# Patient Record
Sex: Female | Born: 1985 | Race: Black or African American | Hispanic: No | Marital: Married | Smoking: Never smoker
Health system: Southern US, Community
[De-identification: ages and names within clinical notes are randomized; demographics above are authoritative.]

## PROBLEM LIST (undated history)

## (undated) ENCOUNTER — Inpatient Hospital Stay (HOSPITAL_COMMUNITY): Payer: Self-pay

## (undated) DIAGNOSIS — D696 Thrombocytopenia, unspecified: Secondary | ICD-10-CM

## (undated) DIAGNOSIS — K219 Gastro-esophageal reflux disease without esophagitis: Secondary | ICD-10-CM

## (undated) DIAGNOSIS — L709 Acne, unspecified: Secondary | ICD-10-CM

## (undated) DIAGNOSIS — Z9889 Other specified postprocedural states: Secondary | ICD-10-CM

## (undated) DIAGNOSIS — IMO0002 Reserved for concepts with insufficient information to code with codable children: Secondary | ICD-10-CM

## (undated) DIAGNOSIS — S82409A Unspecified fracture of shaft of unspecified fibula, initial encounter for closed fracture: Secondary | ICD-10-CM

## (undated) DIAGNOSIS — R87619 Unspecified abnormal cytological findings in specimens from cervix uteri: Secondary | ICD-10-CM

## (undated) DIAGNOSIS — S83519A Sprain of anterior cruciate ligament of unspecified knee, initial encounter: Secondary | ICD-10-CM

## (undated) DIAGNOSIS — N390 Urinary tract infection, site not specified: Secondary | ICD-10-CM

## (undated) DIAGNOSIS — L509 Urticaria, unspecified: Secondary | ICD-10-CM

## (undated) DIAGNOSIS — A6009 Herpesviral infection of other urogenital tract: Secondary | ICD-10-CM

## (undated) DIAGNOSIS — Z22322 Carrier or suspected carrier of Methicillin resistant Staphylococcus aureus: Secondary | ICD-10-CM

## (undated) DIAGNOSIS — Z889 Allergy status to unspecified drugs, medicaments and biological substances status: Secondary | ICD-10-CM

## (undated) DIAGNOSIS — A749 Chlamydial infection, unspecified: Secondary | ICD-10-CM

## (undated) DIAGNOSIS — N83209 Unspecified ovarian cyst, unspecified side: Secondary | ICD-10-CM

## (undated) HISTORY — PX: OTHER SURGICAL HISTORY: SHX169

## (undated) HISTORY — DX: Urticaria, unspecified: L50.9

---
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
## 2005-01-06 ENCOUNTER — Emergency Department (HOSPITAL_COMMUNITY): Admission: EM | Admit: 2005-01-06 | Discharge: 2005-01-06 | Payer: Self-pay | Admitting: Internal Medicine

## 2005-03-15 LAB — OB RESULTS CONSOLE HEPATITIS B SURFACE ANTIGEN: Hepatitis B Surface Ag: NEGATIVE

## 2005-07-21 DIAGNOSIS — S82409A Unspecified fracture of shaft of unspecified fibula, initial encounter for closed fracture: Secondary | ICD-10-CM

## 2005-07-21 HISTORY — DX: Unspecified fracture of shaft of unspecified fibula, initial encounter for closed fracture: S82.409A

## 2005-09-10 ENCOUNTER — Ambulatory Visit (HOSPITAL_COMMUNITY): Admission: RE | Admit: 2005-09-10 | Discharge: 2005-09-10 | Payer: Self-pay | Admitting: Obstetrics & Gynecology

## 2005-09-10 IMAGING — US US OB COMP LESS 14 WK
1 series · 18 of 28 positions shown · non-contrast
Comparison: none

CLINICAL DATA: Uncertain menstrual dates.  
 OBSTETRICAL ULTRASOUND <14 WKS AND TRANSVAGINAL OB US:
TECHNIQUE: Both transabdominal and transvaginal ultrasound examinations were performed for complete evaluation of the gestation as well as the maternal uterus, adnexal regions, and pelvic cul-de-sac.

[Series 1: us ob comp less 14 wks · 18 of 51 slices shown]
[im 1/51]
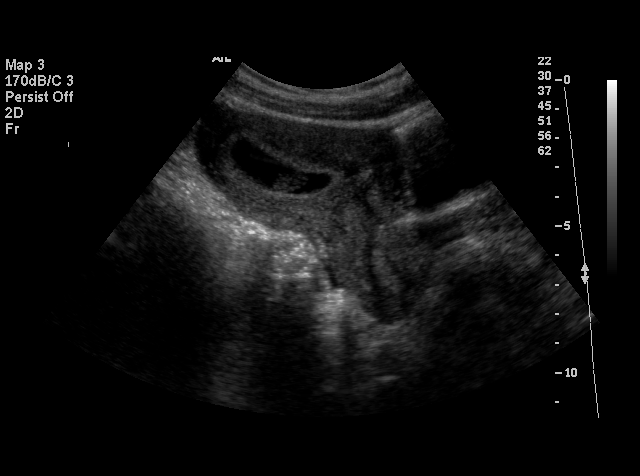
[im 4/51]
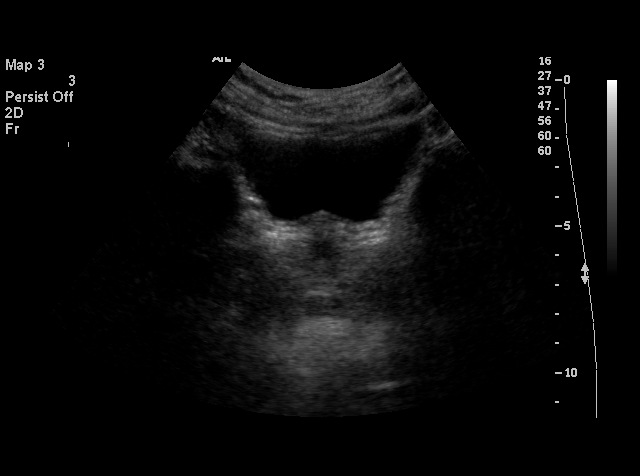
[im 6/51]
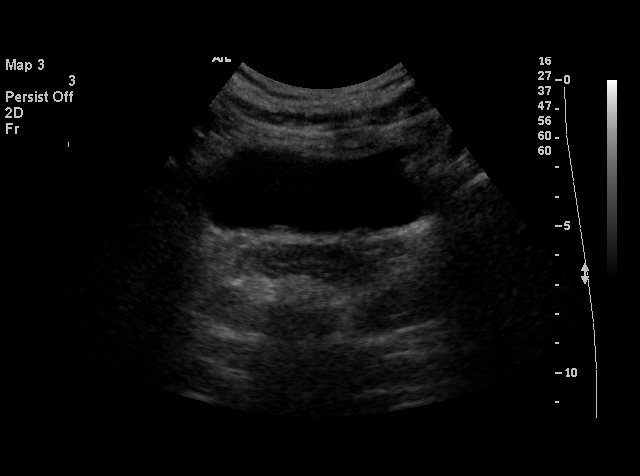
[im 10/51]
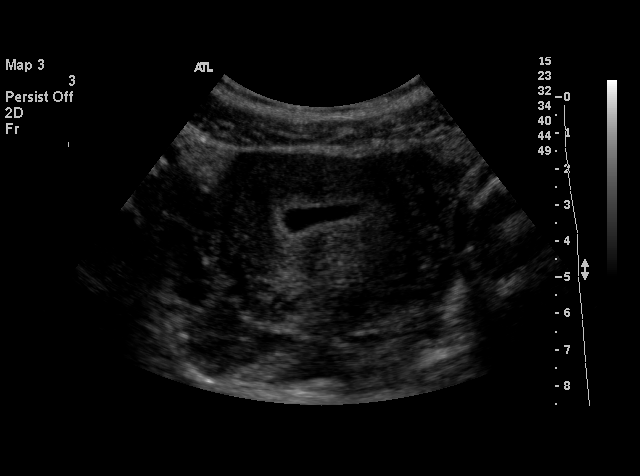
[im 13/51]
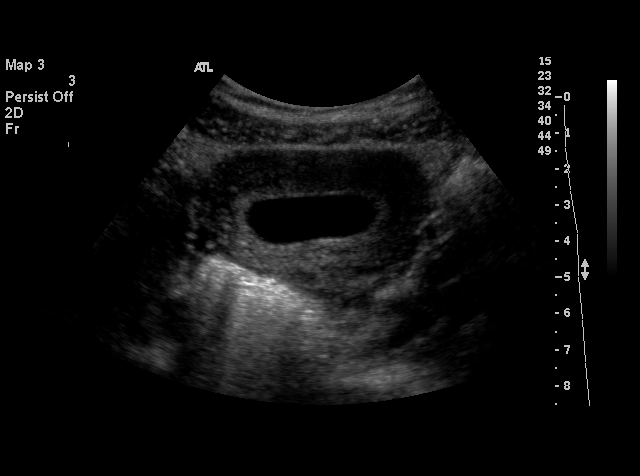
[im 15/51]
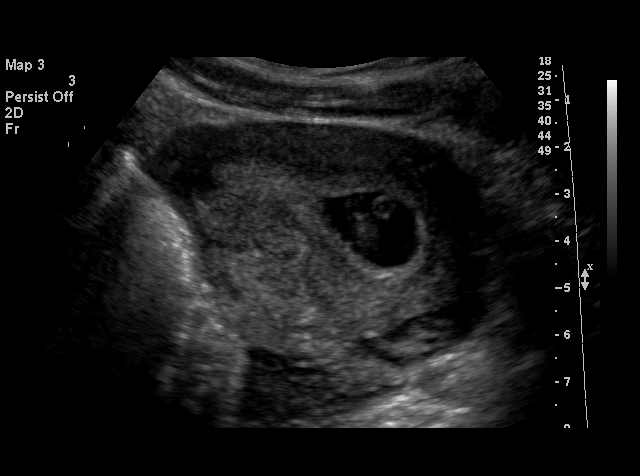
[im 19/51]
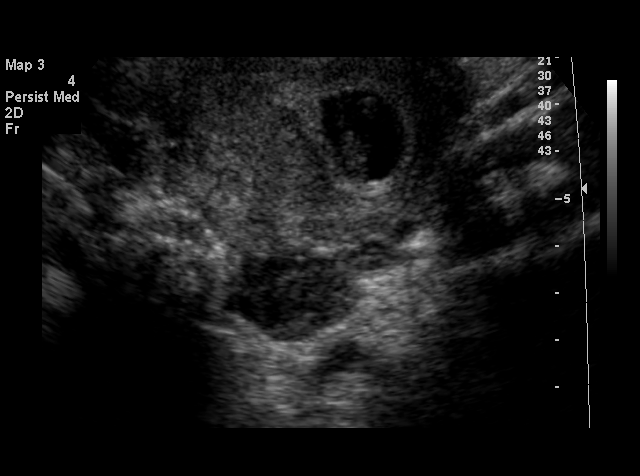
[im 21/51]
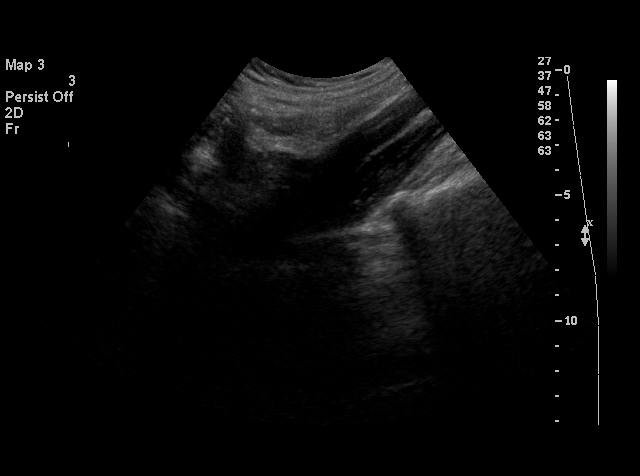
[im 25/51]
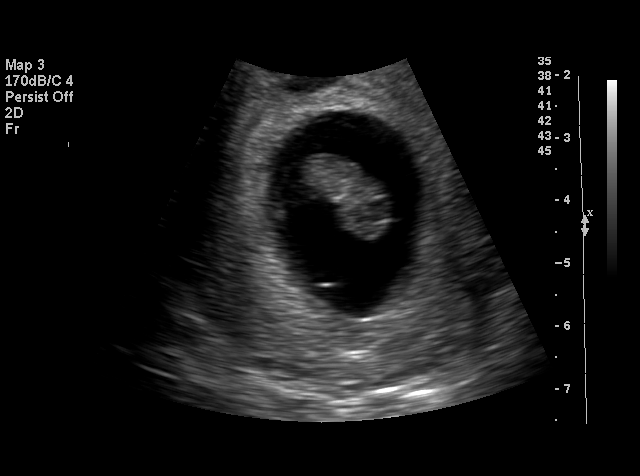
[im 26/51]
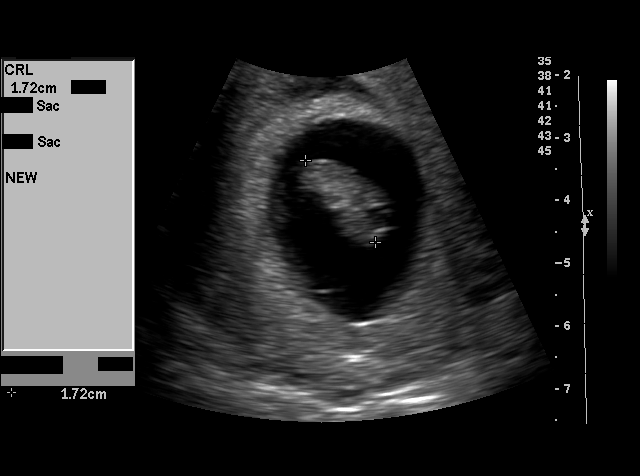
[im 30/51]
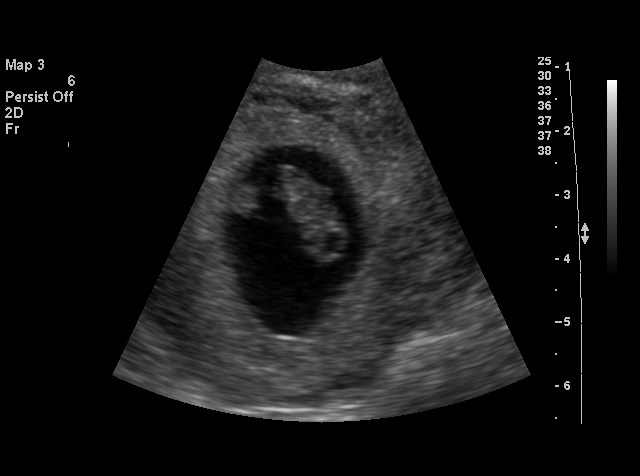
[im 32/51]
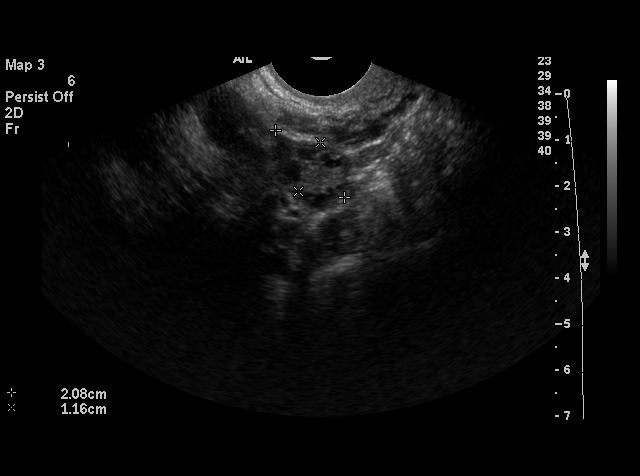
[im 36/51]
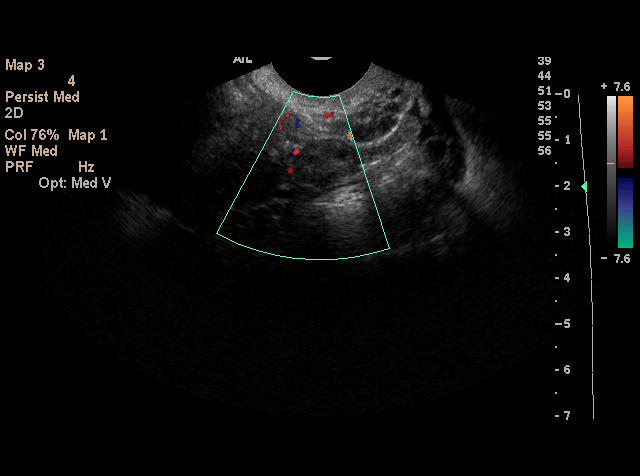
[im 39/51]
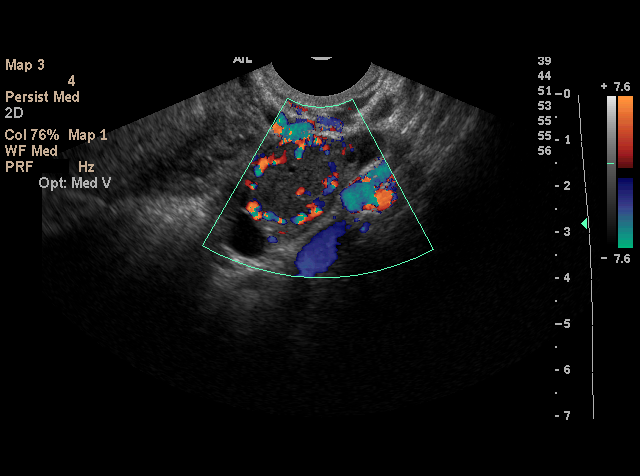
[im 41/51]
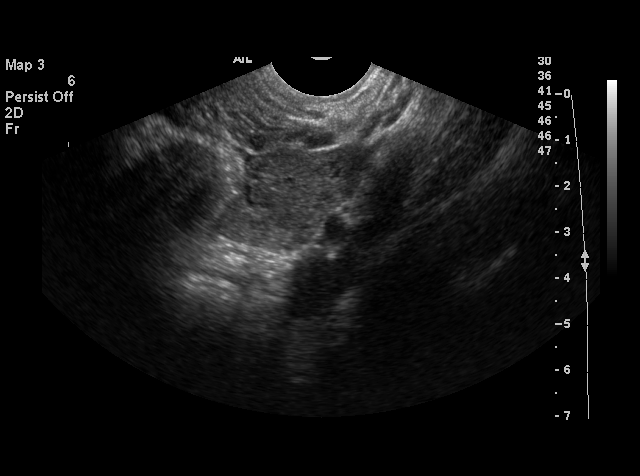
[im 45/51]
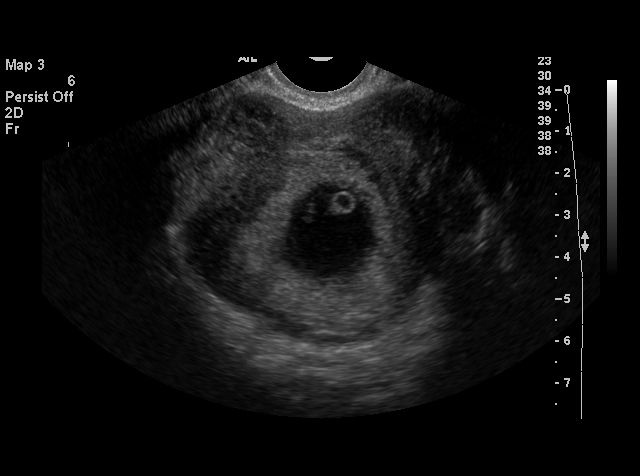
[im 47/51]
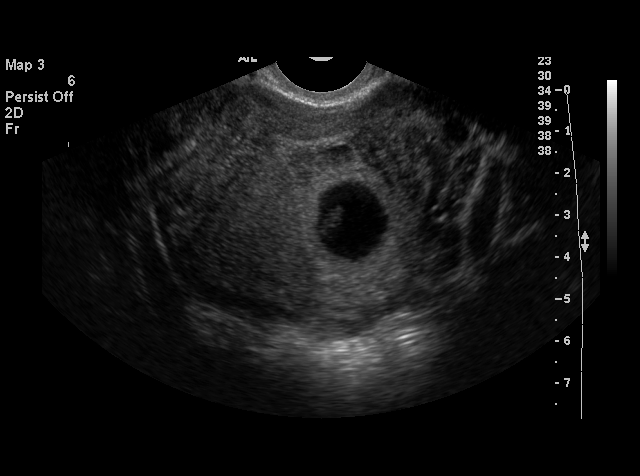
[im 51/51]
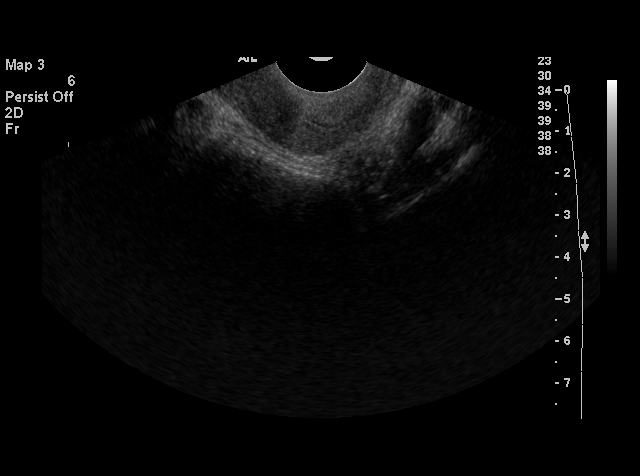

[18 of 28 positions shown; findings below may reference images not displayed]

FINDINGS: A single living intrauterine gestation is seen with measured heart rate of 171.  Embryonic crown rump length measures 1.7 cm, corresponding with a gestational age of 8 weeks 2 days.  A normal appearing yolk sac is seen.  A tiny subchorionic hemorrhage or implantation bleed is noted.  No fibroids or other uterine abnormalities are identified.
 Both ovaries are visualized and are normal in appearance.  A small corpus luteum is noted in the left ovary measuring approximately 1.8 cm.  There is no evidence of adnexal mass or free fluid.
IMPRESSION: 1.  Single living intrauterine gestation with estimated gestational age of 8 weeks 2 days and sonographic EDC of [DATE].
 2.  Tiny subchorionic hemorrhage/implantation bleed noted.  No evidence of maternal uterine or adnexal mass.

## 2005-11-18 ENCOUNTER — Emergency Department (HOSPITAL_COMMUNITY): Admission: EM | Admit: 2005-11-18 | Discharge: 2005-11-19 | Payer: Self-pay | Admitting: Emergency Medicine

## 2005-12-02 IMAGING — CR DG CHEST 1V PORT
1 series · 1 of 1 positions shown · non-contrast
Comparison: None

CLINICAL DATA: Dizziness

PORTABLE CHEST - 1 VIEW:

[view not recorded]
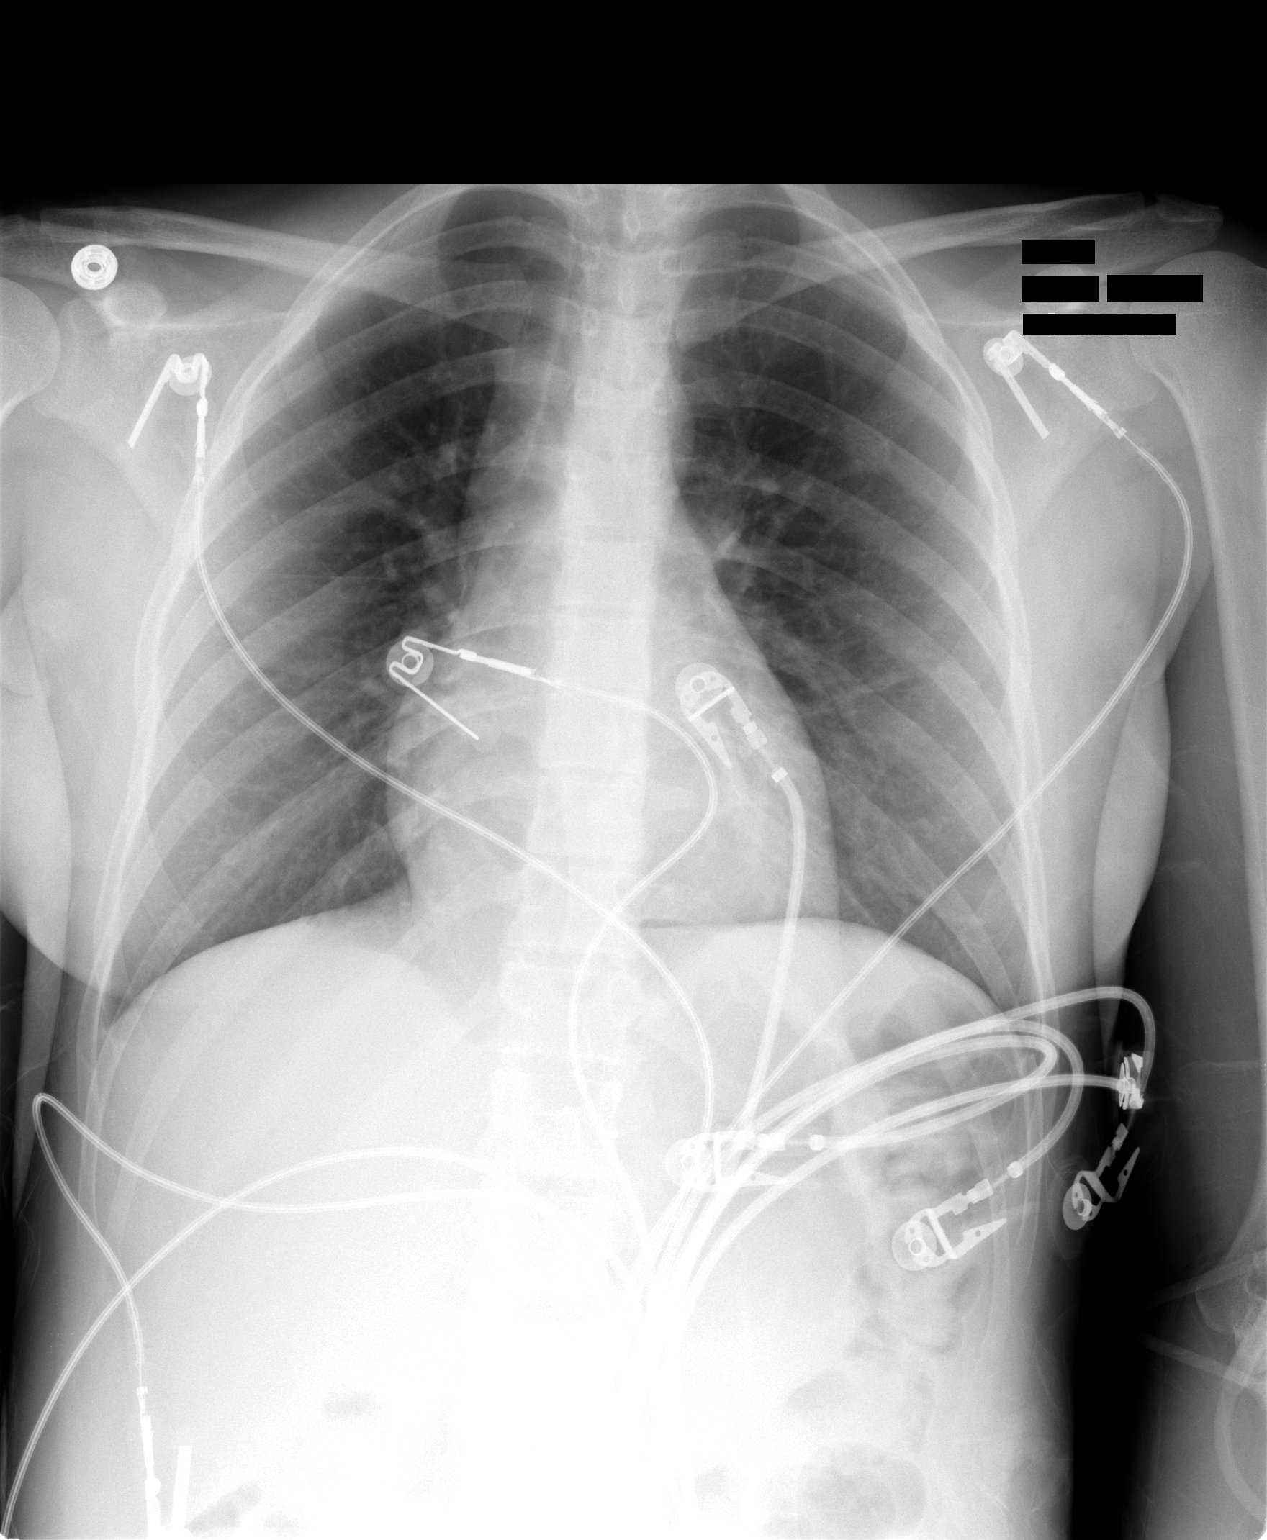

[1 of 1 positions shown; findings below may reference images not displayed]

FINDINGS: The heart size and mediastinal contours are within normal limits. 
Both lungs are clear.
IMPRESSION: No acute disease.

## 2006-02-11 ENCOUNTER — Emergency Department (HOSPITAL_COMMUNITY): Admission: EM | Admit: 2006-02-11 | Discharge: 2006-02-11 | Payer: Self-pay | Admitting: Emergency Medicine

## 2007-06-03 ENCOUNTER — Inpatient Hospital Stay (HOSPITAL_COMMUNITY): Admission: AD | Admit: 2007-06-03 | Discharge: 2007-06-03 | Payer: Self-pay | Admitting: Obstetrics & Gynecology

## 2007-06-03 IMAGING — US US OB COMP LESS 14 WK
1 series · 13 of 13 positions shown · non-contrast
Comparison: none

OBSTETRICAL ULTRASOUND:

 This ultrasound exam was performed in the [HOSPITAL] Ultrasound Department.  The OB US report was generated in the AS system, and faxed to the ordering physician.  This report is also available in [REDACTED] PACS.

[Series 1: us ob comp less 14 wk · 0.23mm/px · 13 of 13 slices shown]
[im 1/13]
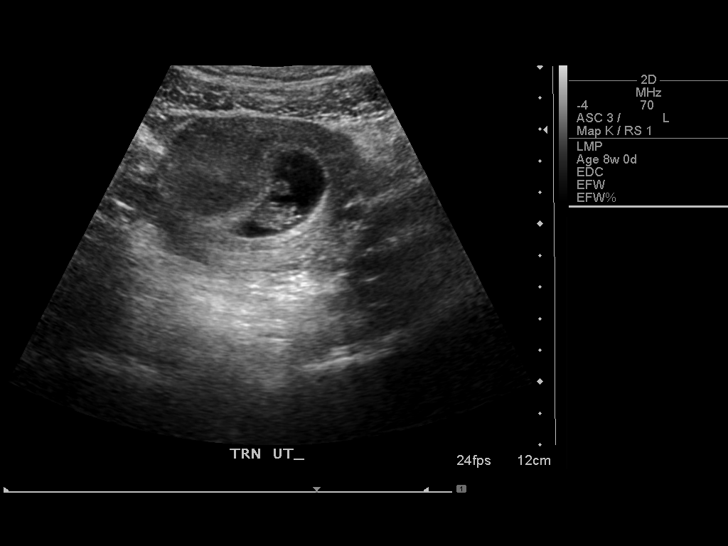
[im 2/13]
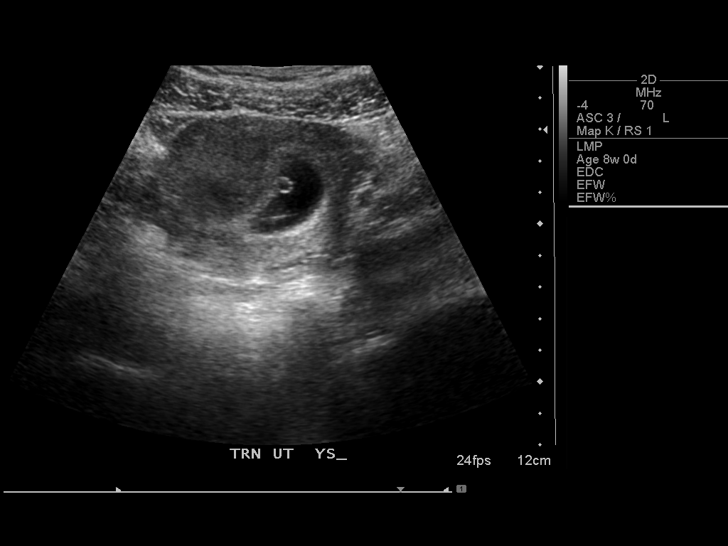
[im 3/13]
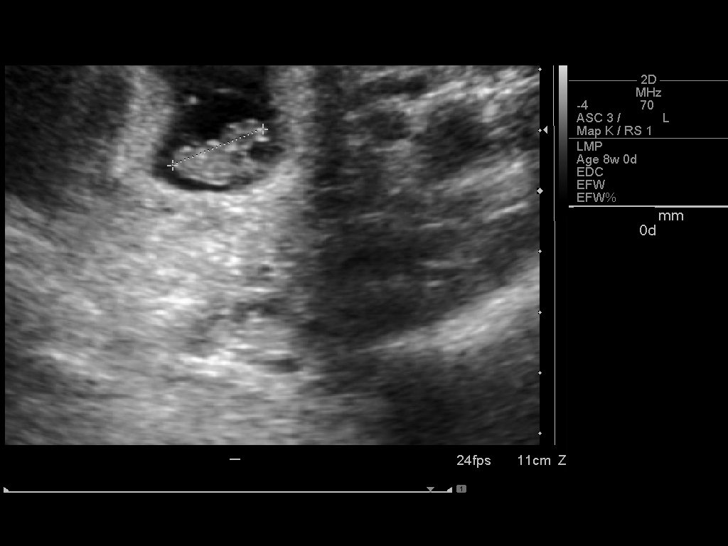
[im 4/13]
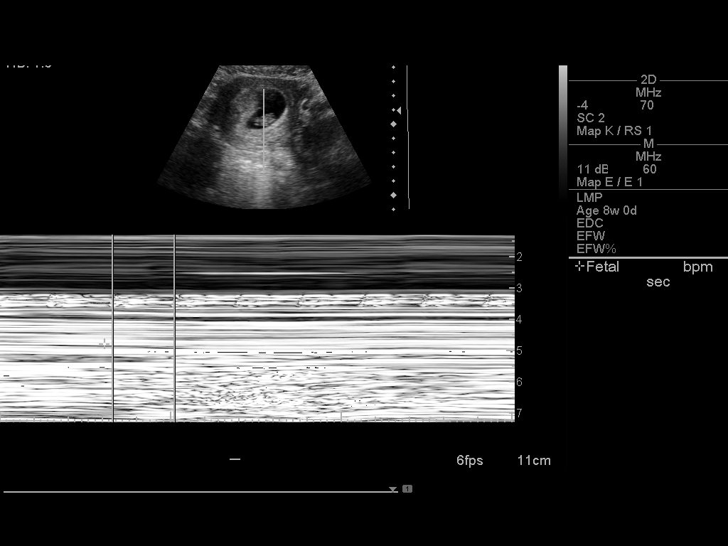
[im 5/13]
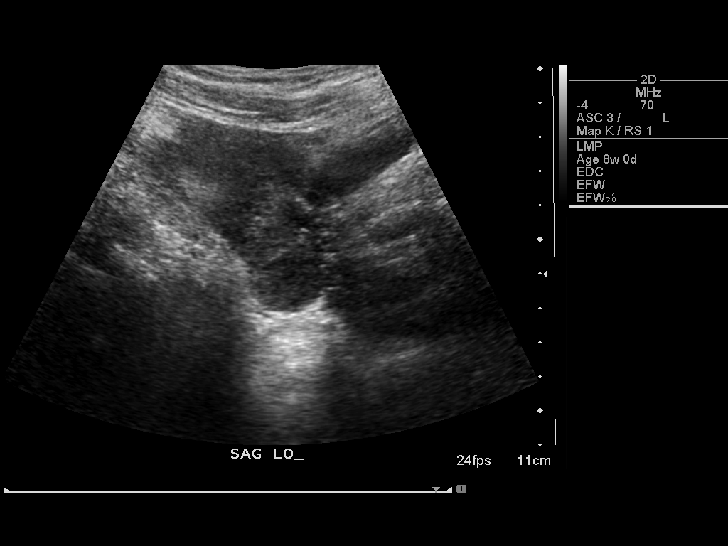
[im 6/13]
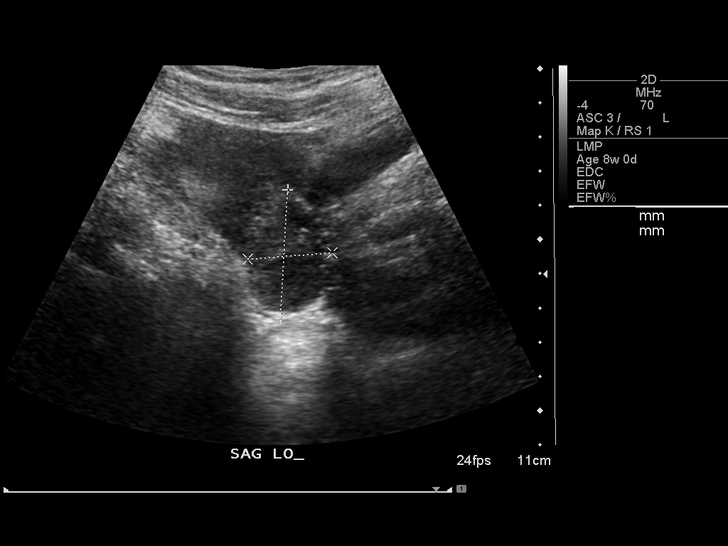
[im 7/13]
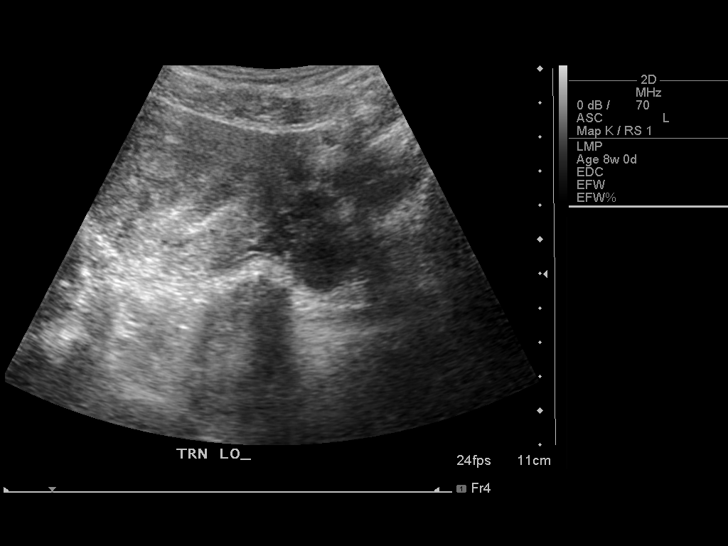
[im 8/13]
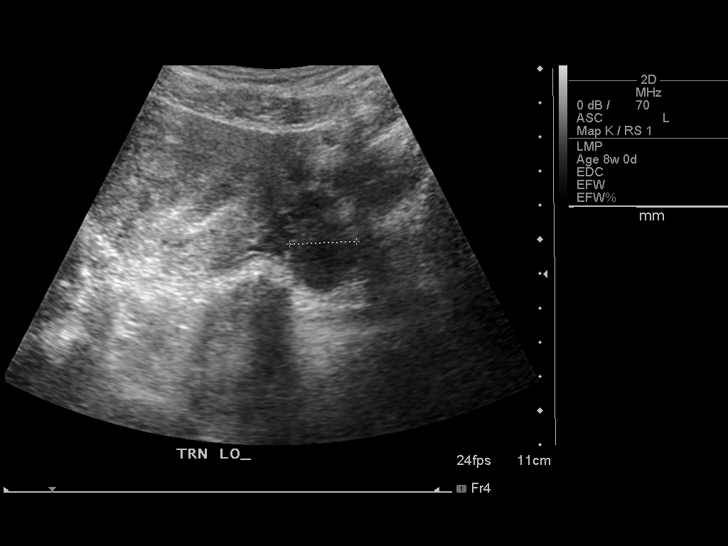
[im 9/13]
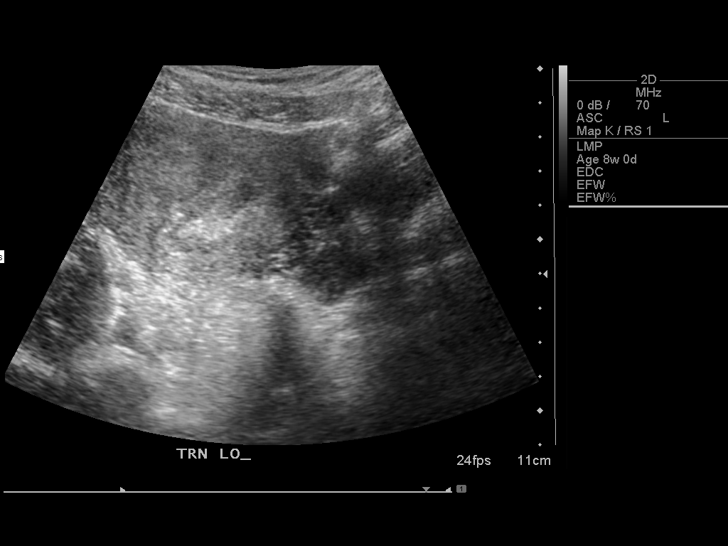
[im 10/13]
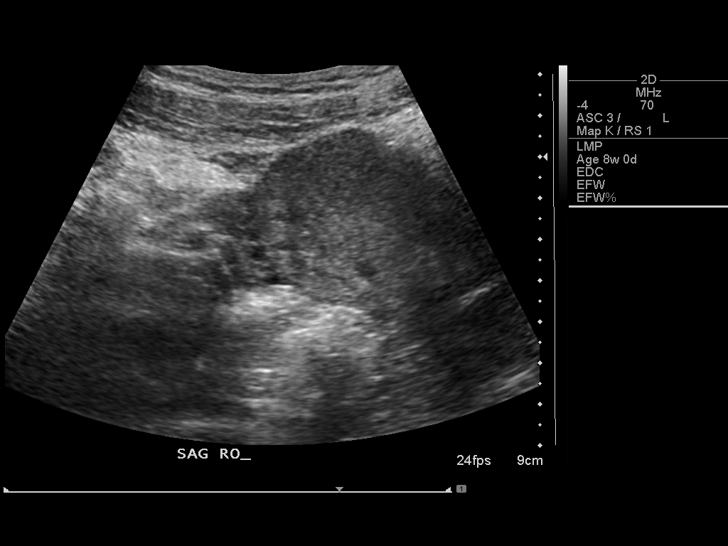
[im 11/13]
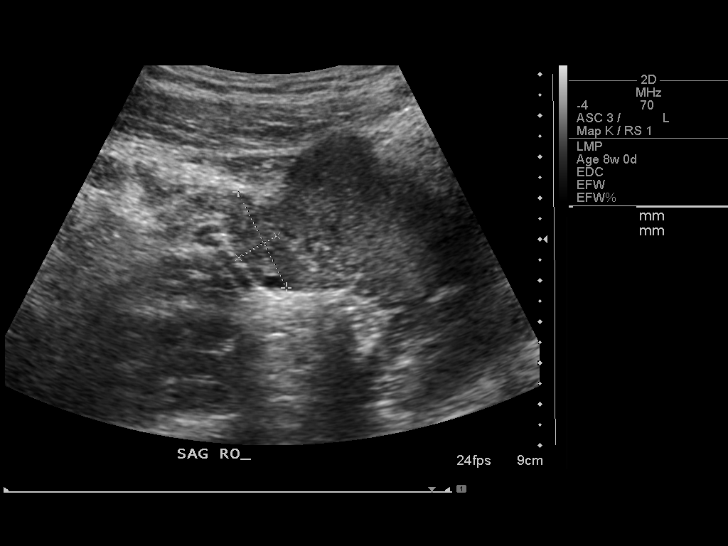
[im 12/13]
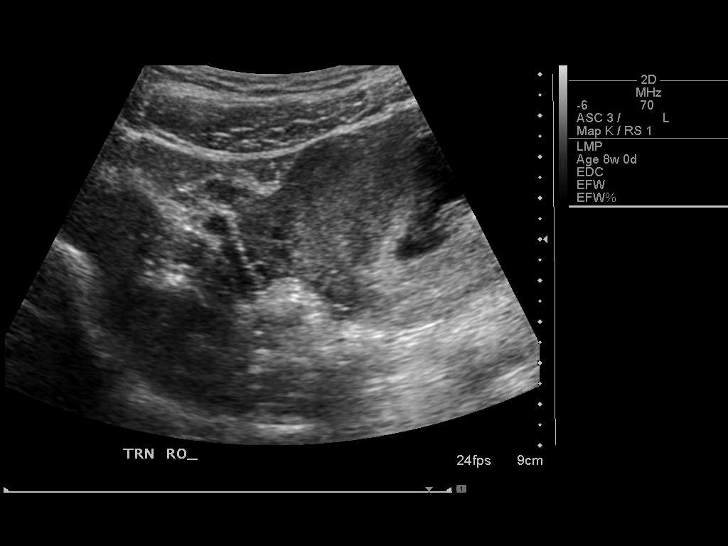
[im 13/13]
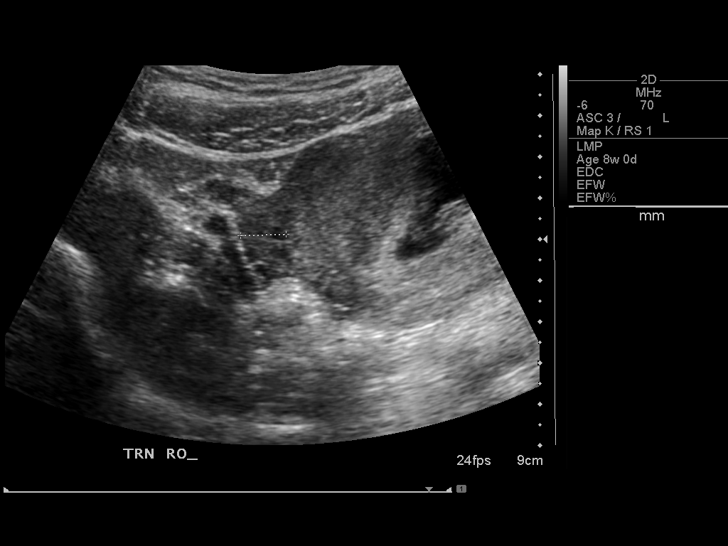

[13 of 13 positions shown; findings below may reference images not displayed]

IMPRESSION: See AS Obstetric US report.

## 2007-07-24 ENCOUNTER — Inpatient Hospital Stay (HOSPITAL_COMMUNITY): Admission: AD | Admit: 2007-07-24 | Discharge: 2007-07-24 | Payer: Self-pay | Admitting: Obstetrics & Gynecology

## 2007-09-20 ENCOUNTER — Inpatient Hospital Stay (HOSPITAL_COMMUNITY): Admission: AD | Admit: 2007-09-20 | Discharge: 2007-09-20 | Payer: Self-pay | Admitting: *Deleted

## 2007-10-29 ENCOUNTER — Inpatient Hospital Stay (HOSPITAL_COMMUNITY): Admission: AD | Admit: 2007-10-29 | Discharge: 2007-10-30 | Payer: Self-pay | Admitting: Obstetrics

## 2007-11-05 ENCOUNTER — Ambulatory Visit (HOSPITAL_COMMUNITY): Admission: RE | Admit: 2007-11-05 | Discharge: 2007-11-05 | Payer: Self-pay | Admitting: *Deleted

## 2007-11-05 IMAGING — US US ABDOMEN COMPLETE
1 series · 18 of 25 positions shown · non-contrast
Comparison: None

CLINICAL DATA: 30 weeks estimated gestational age with epigastric
pain for 2 weeks

ABDOMEN ULTRASOUND
TECHNIQUE: Complete abdominal ultrasound examination was performed
including evaluation of the liver, gallbladder, bile ducts,
pancreas, kidneys, spleen, IVC, and abdominal aorta.

[Series 1: us abdomen complete · 18 of 80 slices shown]
[im 1/80]
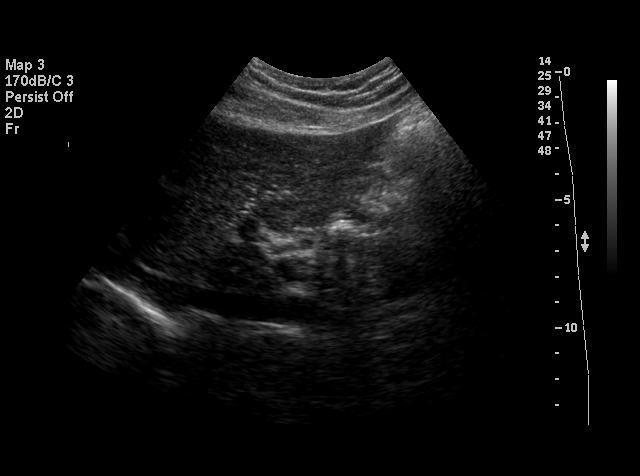
[im 7/80]
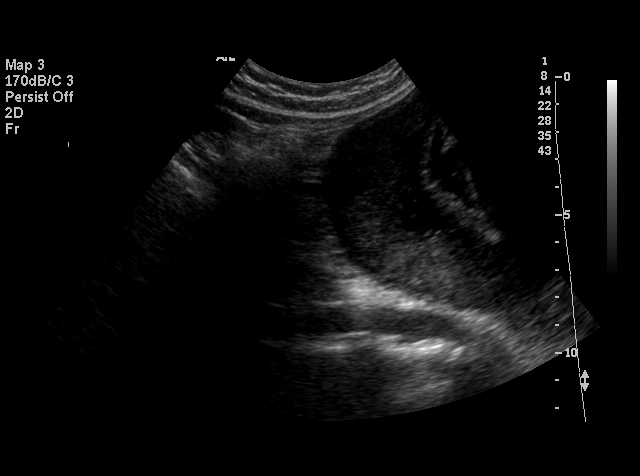
[im 10/80]
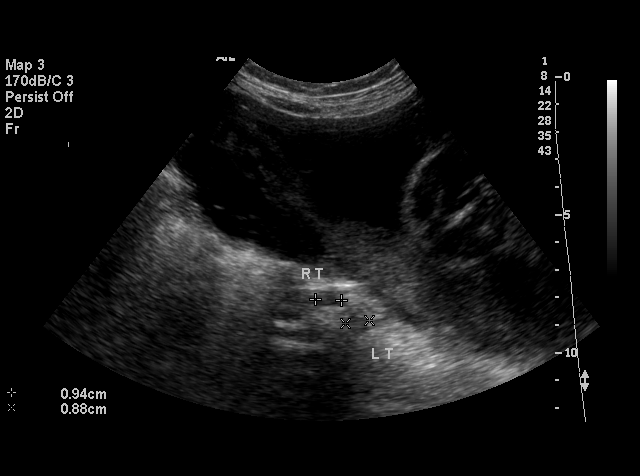
[im 14/80]
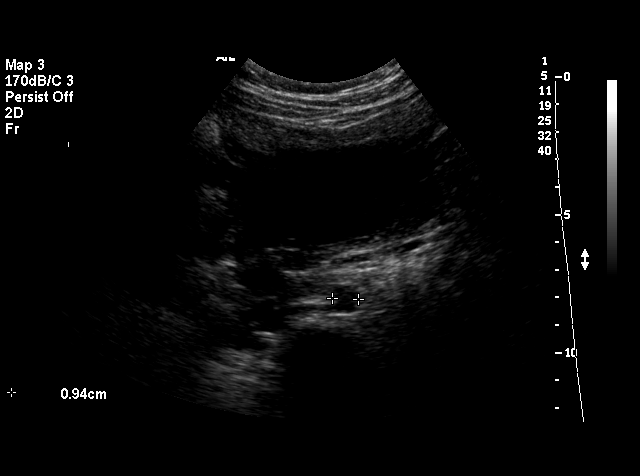
[im 20/80]
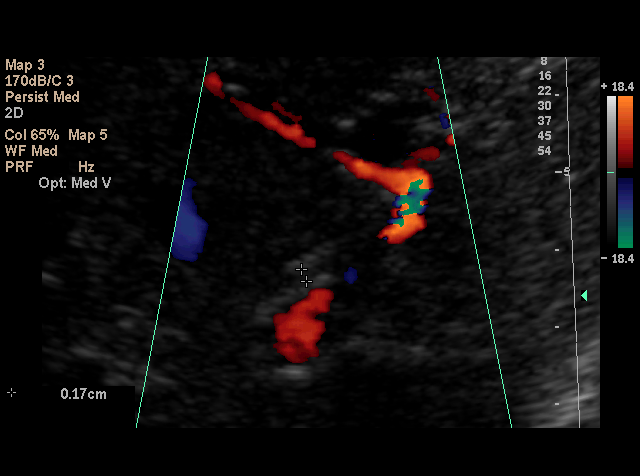
[im 24/80]
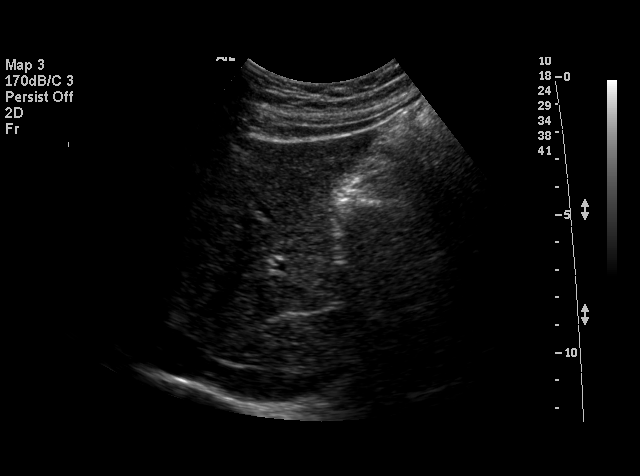
[im 30/80]
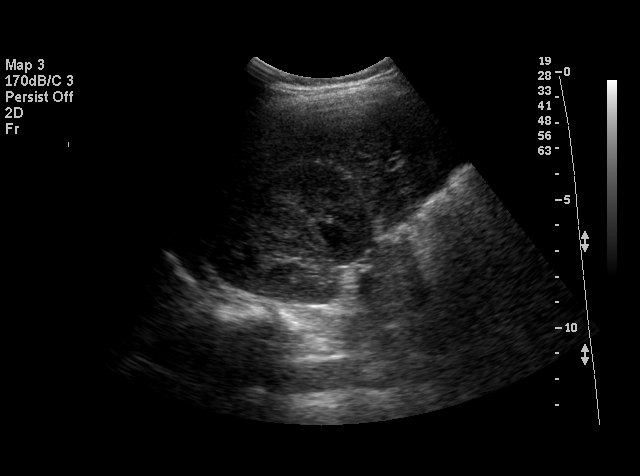
[im 33/80]
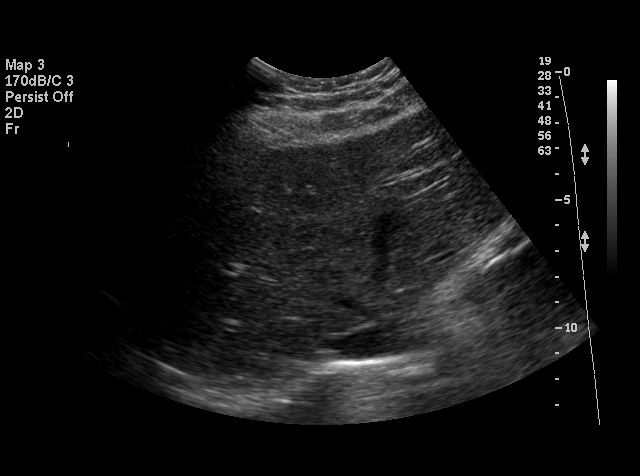
[im 37/80]
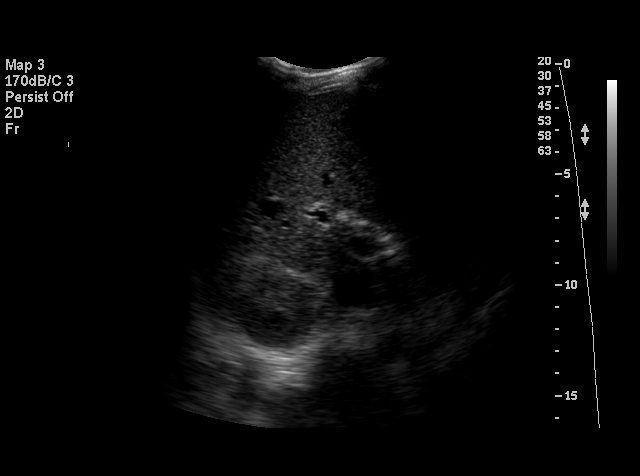
[im 43/80]
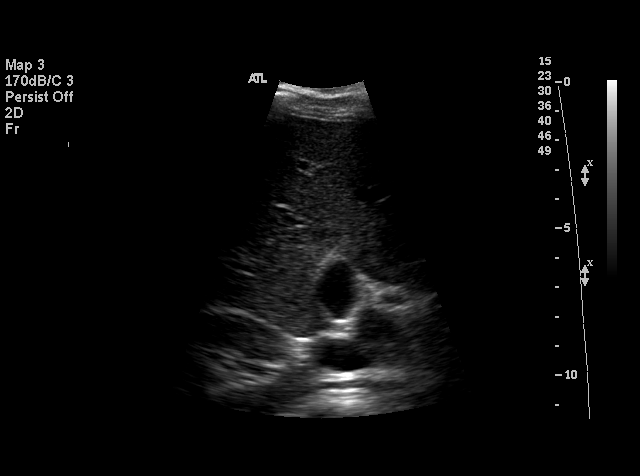
[im 47/80]
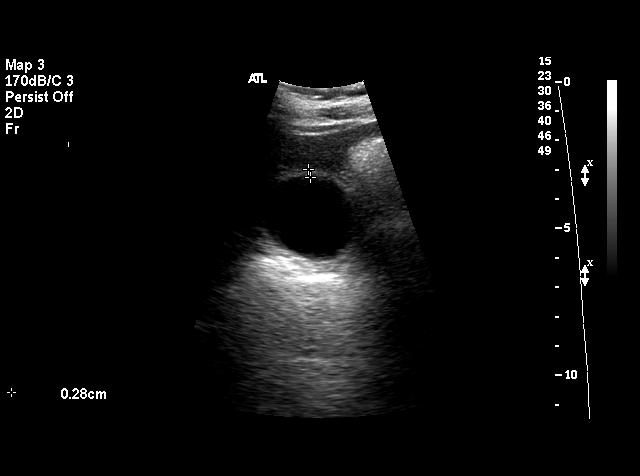
[im 50/80]
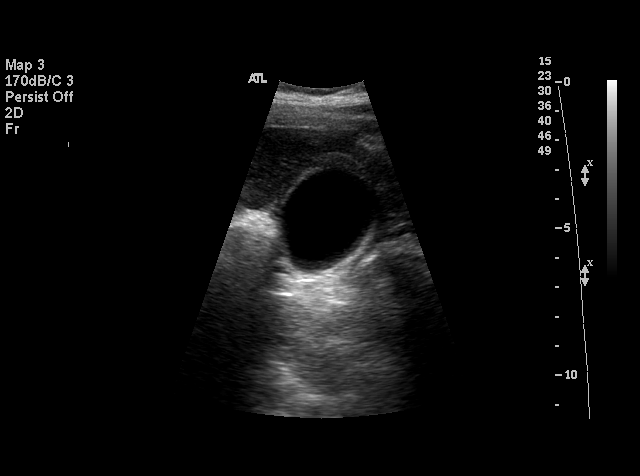
[im 56/80]
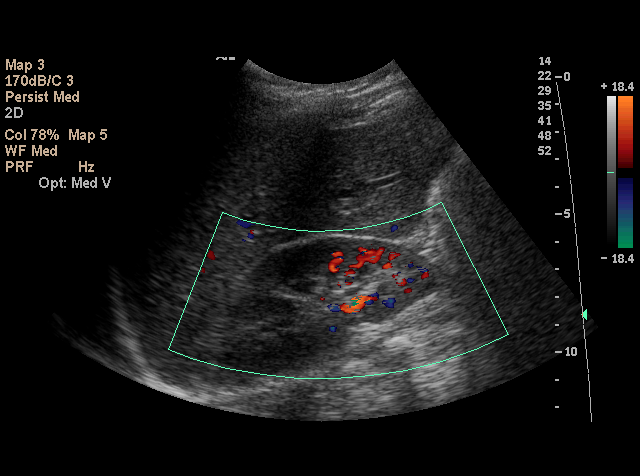
[im 60/80]
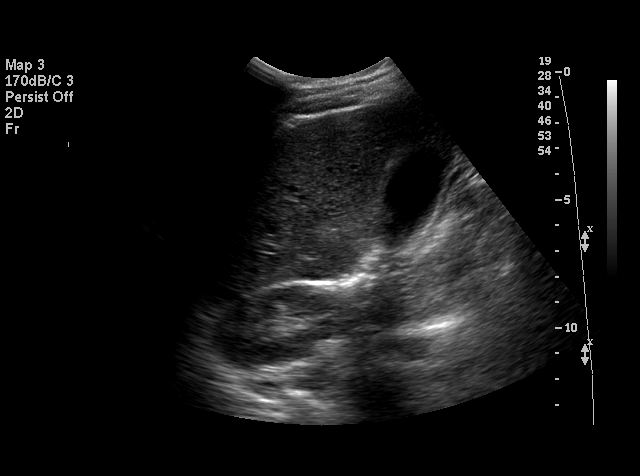
[im 66/80]
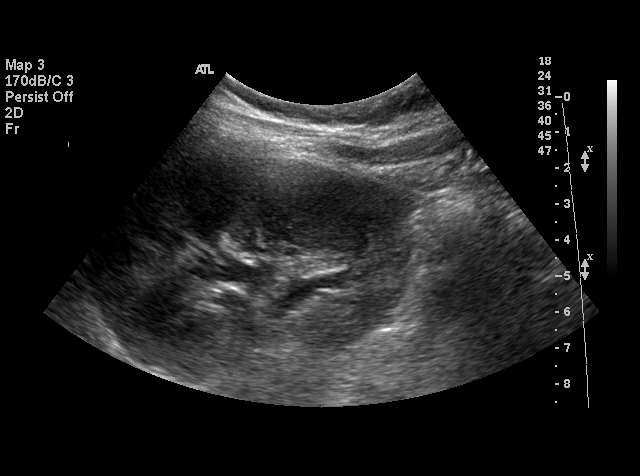
[im 70/80]
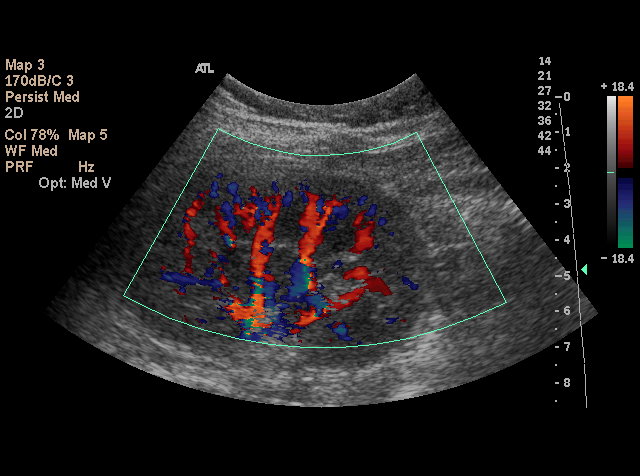
[im 73/80]
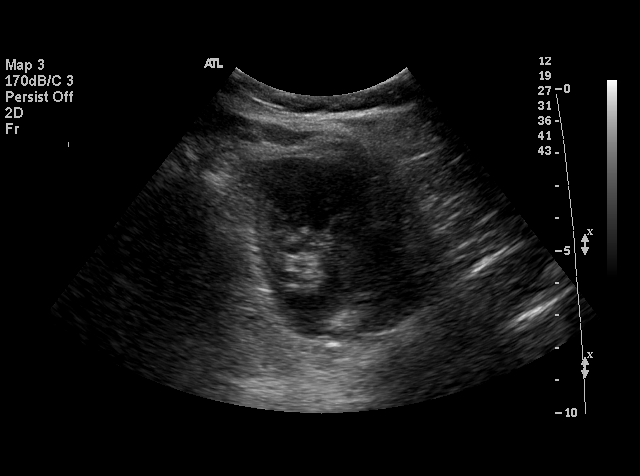
[im 80/80]
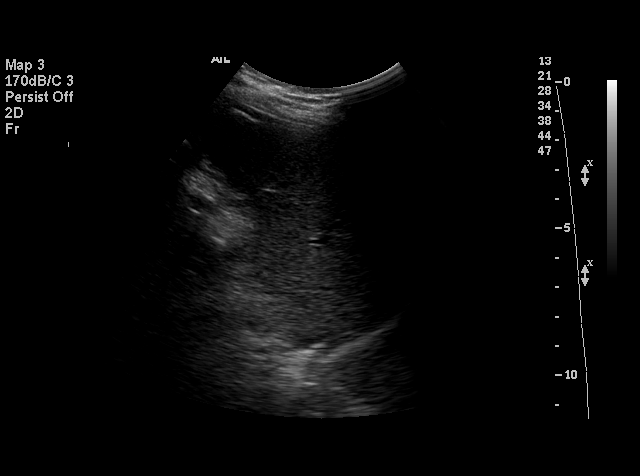

[18 of 25 positions shown; findings below may reference images not displayed]

FINDINGS: The liver parenchyma is homogeneous in echotexture with
no evidence for focal parenchymal abnormality or intrahepatic
ductal dilatation.  The gallbladder is well distended and shows no
evidence for intraluminal stones or sludge.  No pericholecystic
fluid or gallbladder wall thickening is seen. The common bile duct
is normal in size with an AP width of 1.7 mm.

Both kidneys have a normal appearance with the right kidney having
a sagittal length of 8.9 cm and the left kidney having a sagittal
length of 10.0 cm.  No focal parenchymal abnormalities are seen.
The spleen is normal in size and echotexture.

The pancreas is poorly visualized due to overlying gas.  The
abdominal aorta has a maximal caliber of 1.2 cm and the proximal
portion of the inferior vena cava is unremarkable.
IMPRESSION: Poorly visualized pancreas.  Otherwise normal abdominal ultrasound.

## 2007-11-26 ENCOUNTER — Inpatient Hospital Stay (HOSPITAL_COMMUNITY): Admission: AD | Admit: 2007-11-26 | Discharge: 2007-11-26 | Payer: Self-pay | Admitting: Obstetrics and Gynecology

## 2007-11-26 IMAGING — US US OB LIMITED
1 series · 7 of 7 positions shown · non-contrast
Comparison: none

OBSTETRICAL ULTRASOUND:
 This ultrasound exam was performed in the [HOSPITAL] Ultrasound Department.  The OB US report was generated in the AS system, and faxed to the ordering physician.  This report is also available in [REDACTED] PACS.

[Series 1: us ob limited · 0.13mm/px · 7 of 7 slices shown]
[im 1/7]
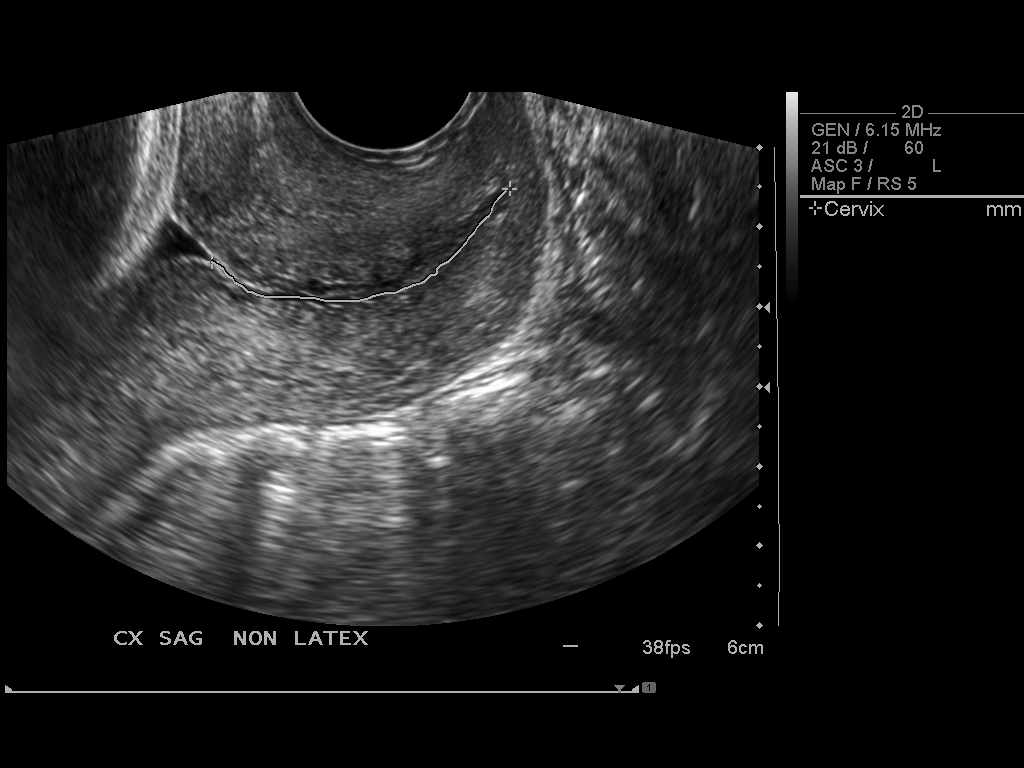
[im 2/7]
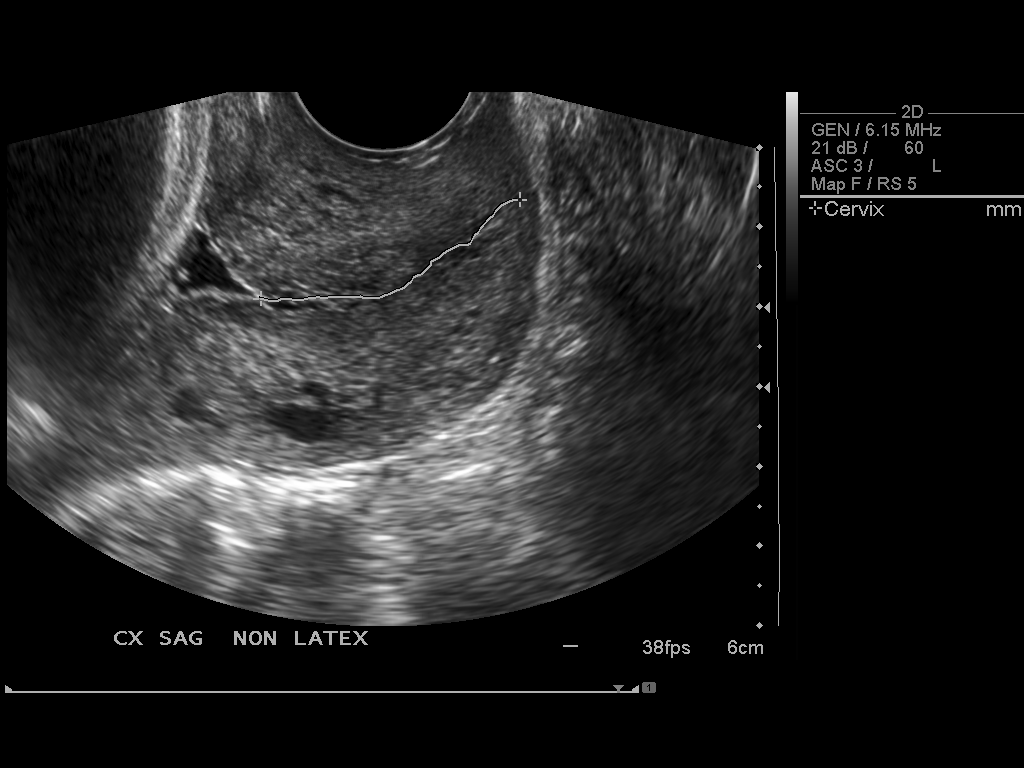
[im 3/7]
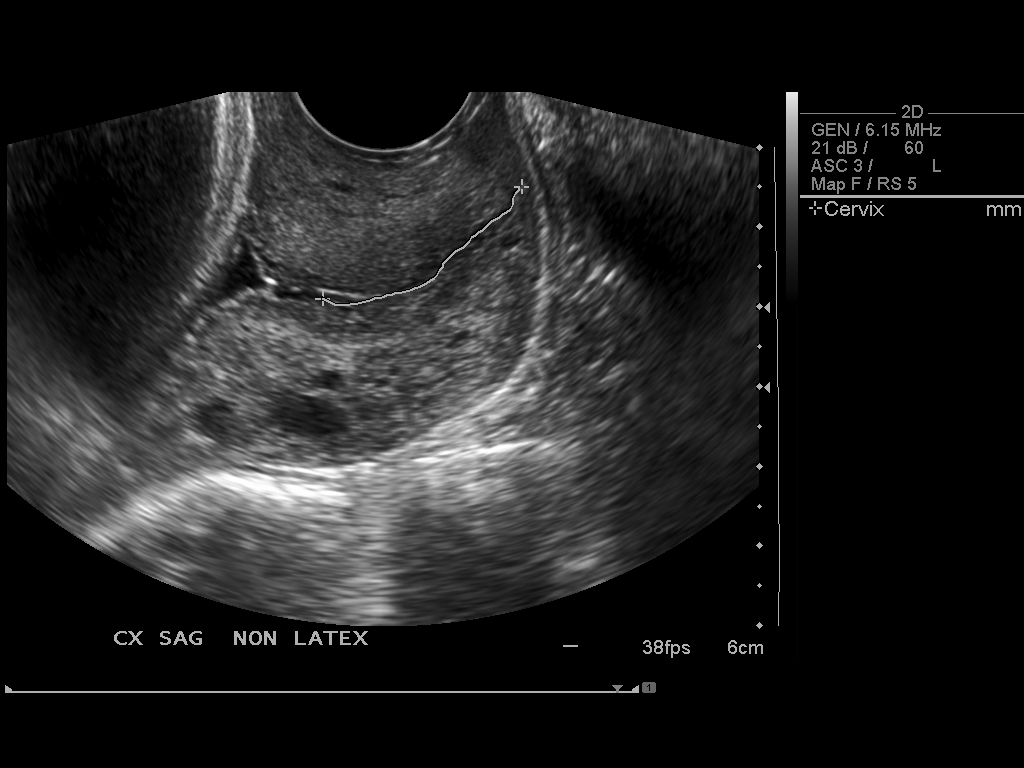
[im 4/7]
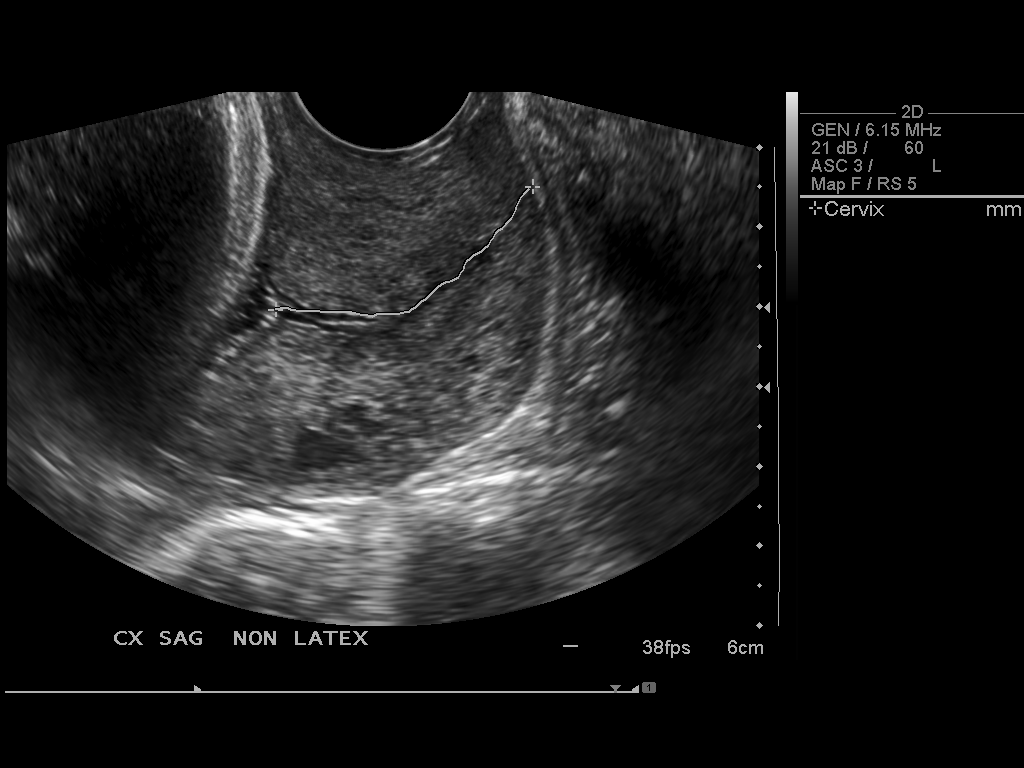
[im 5/7]
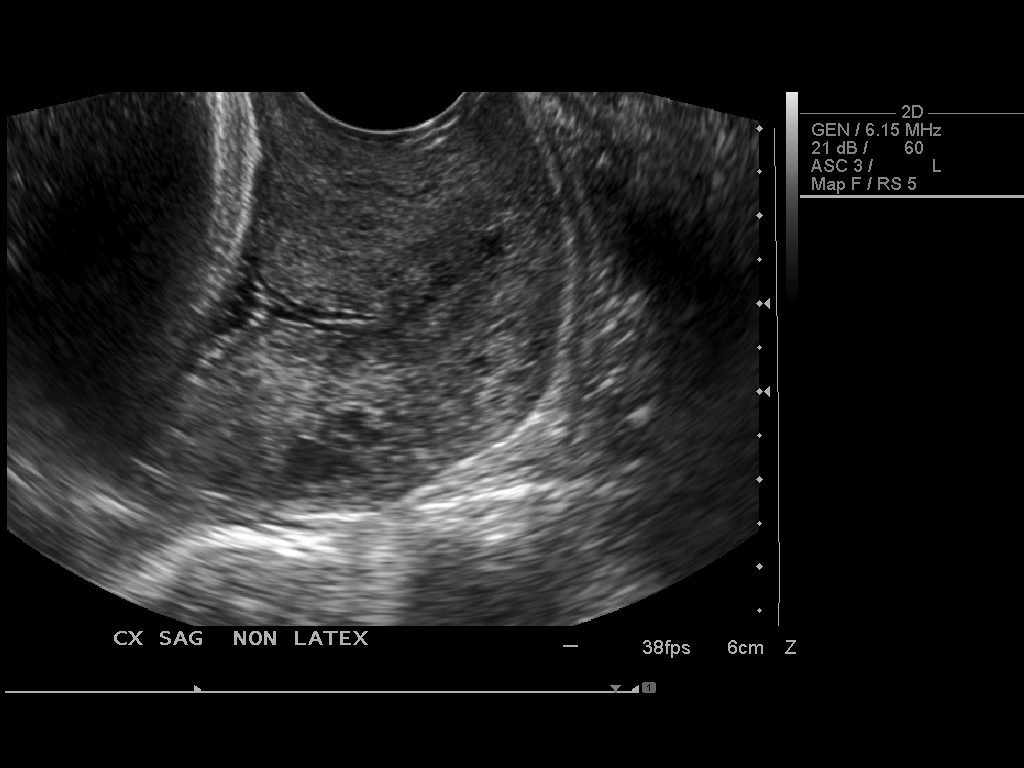
[im 6/7]
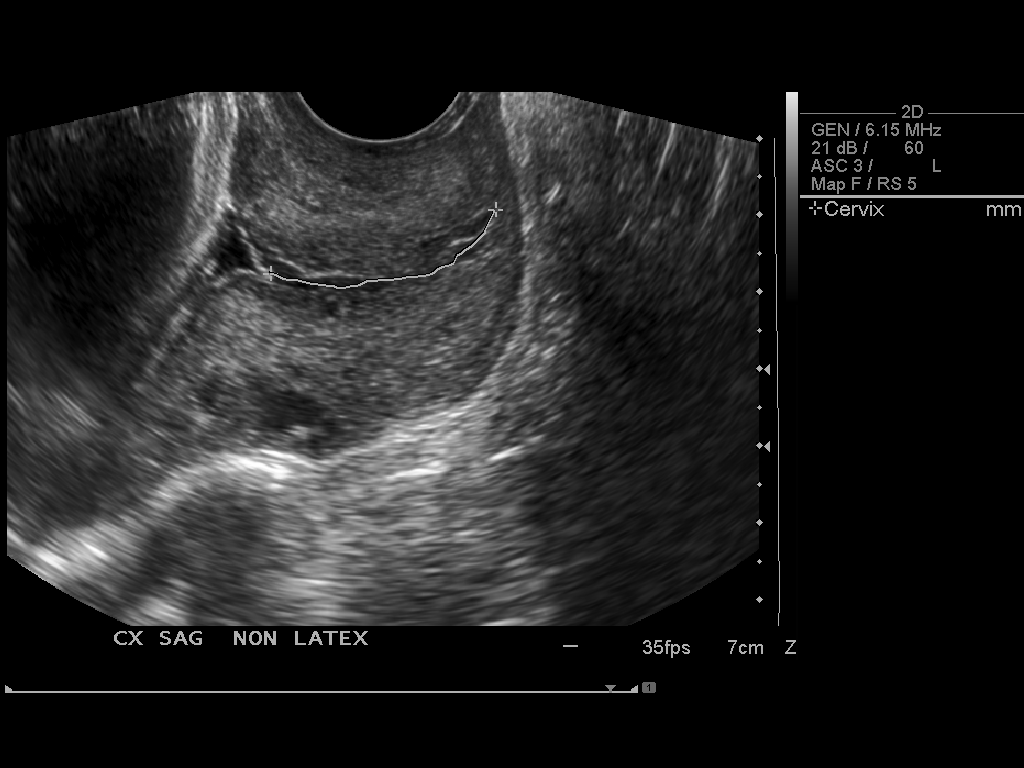
[im 7/7]
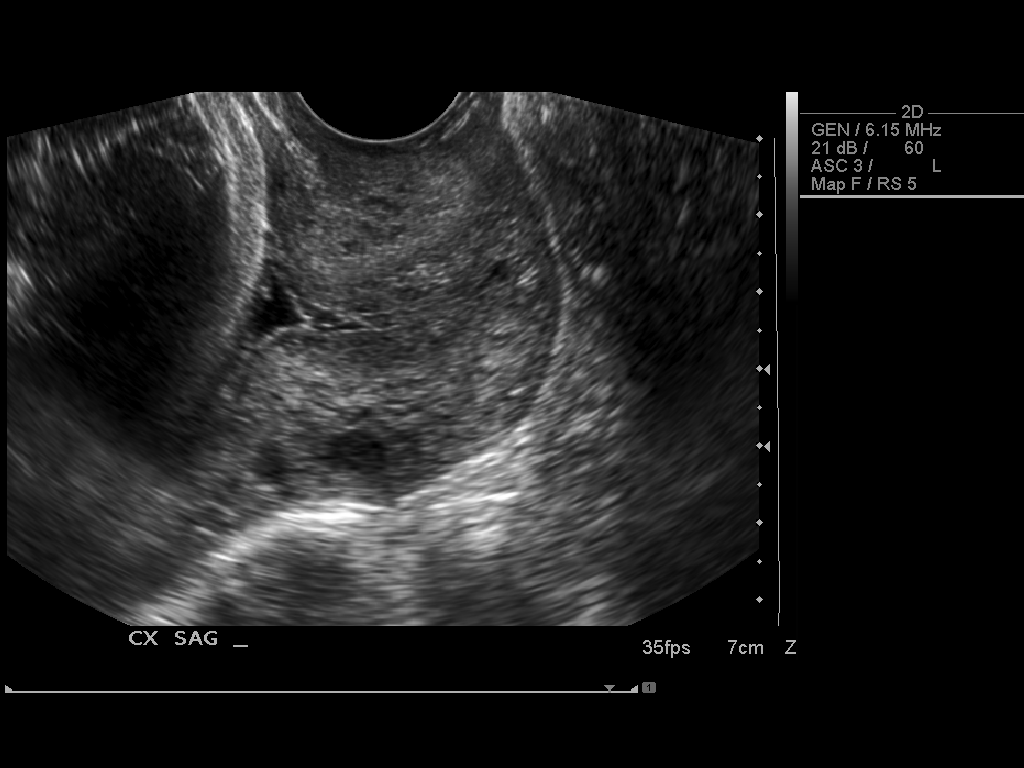

[7 of 7 positions shown; findings below may reference images not displayed]

IMPRESSION: See AS Obstetric US report.

## 2007-11-26 IMAGING — US US OB LIMITED
1 series · 14 of 28 positions shown · non-contrast
Comparison: none

OBSTETRICAL ULTRASOUND:
 This ultrasound exam was performed in the [HOSPITAL] Ultrasound Department.  The OB US report was generated in the AS system, and faxed to the ordering physician.  This report is also available in [REDACTED] PACS.

[Series 1: us ob limited · 0.24mm/px · 14 of 32 slices shown]
[im 2/32]
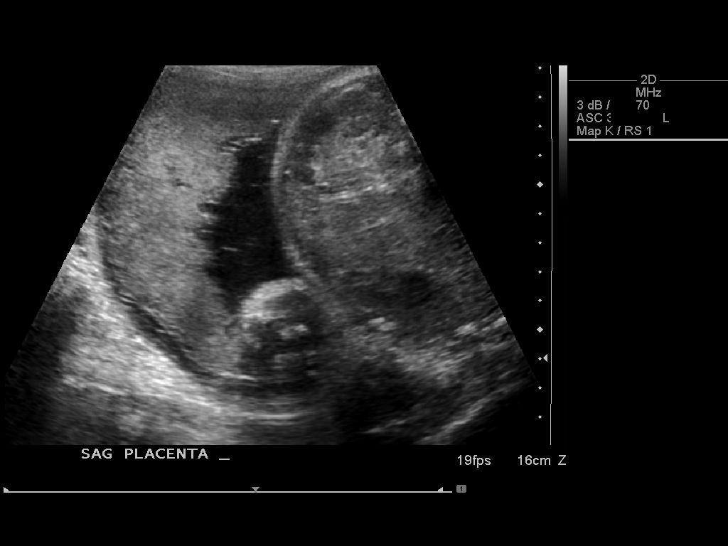
[im 4/32]
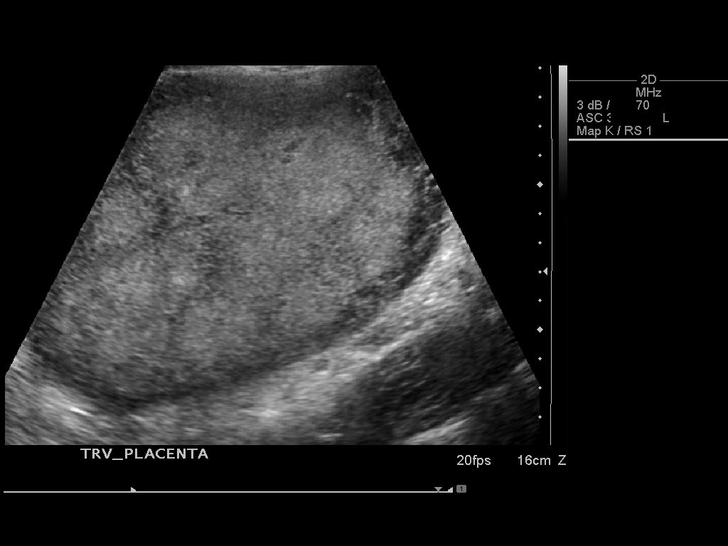
[im 6/32]
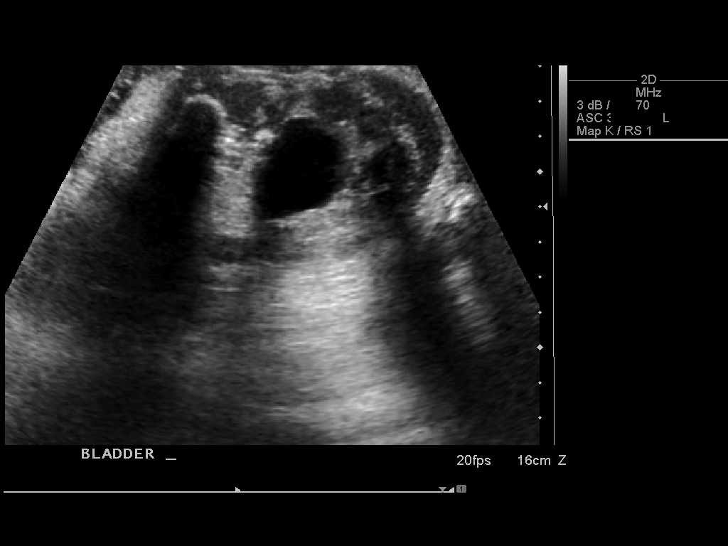
[im 9/32]
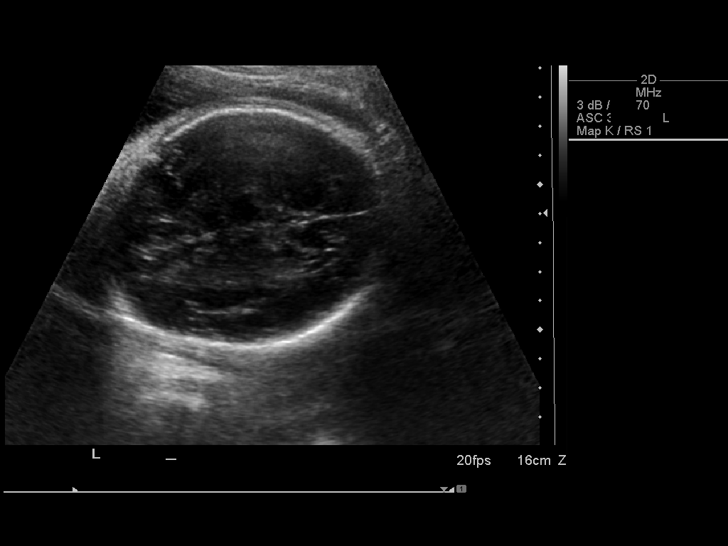
[im 11/32]
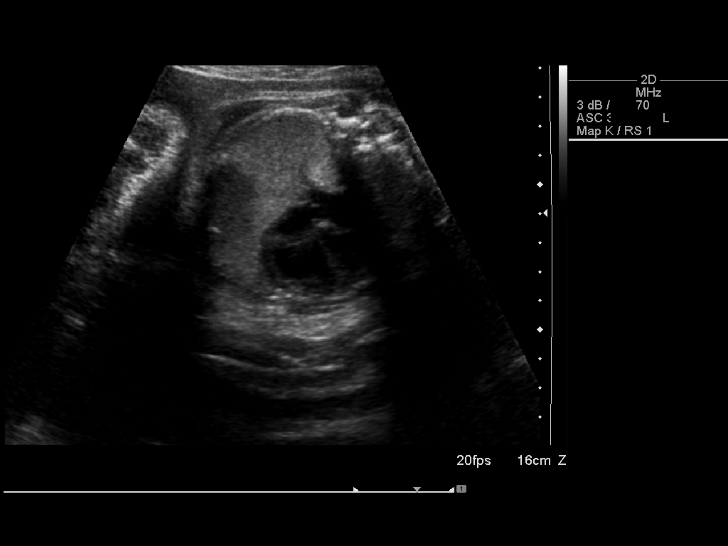
[im 13/32]
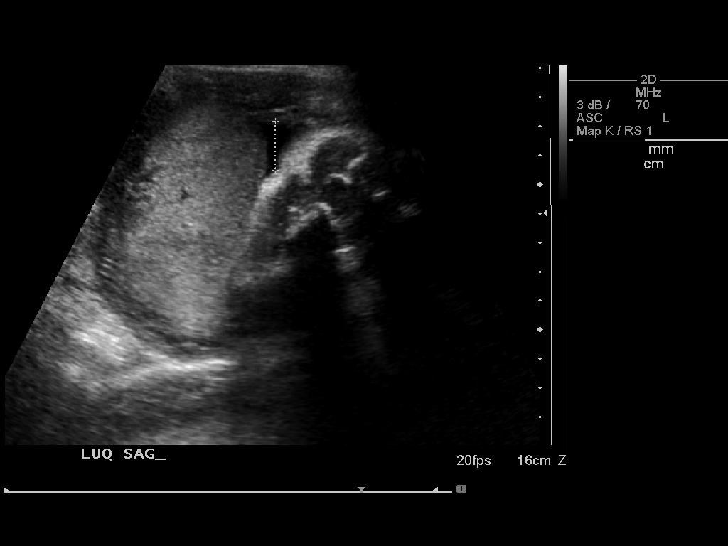
[im 15/32]
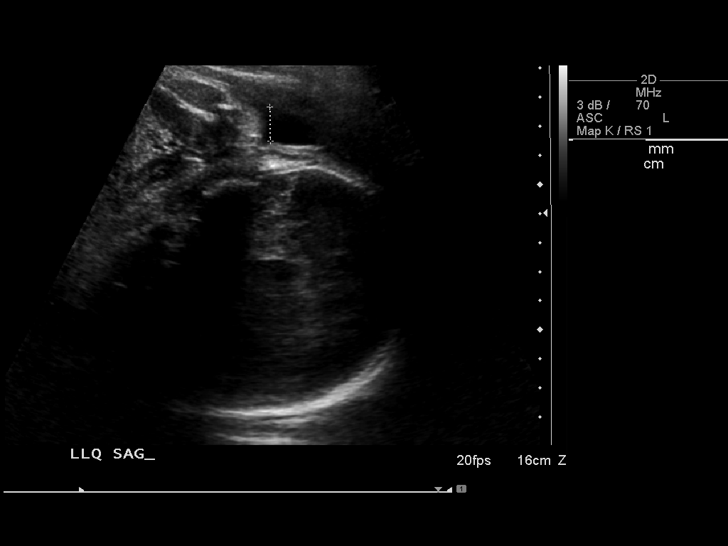
[im 18/32]
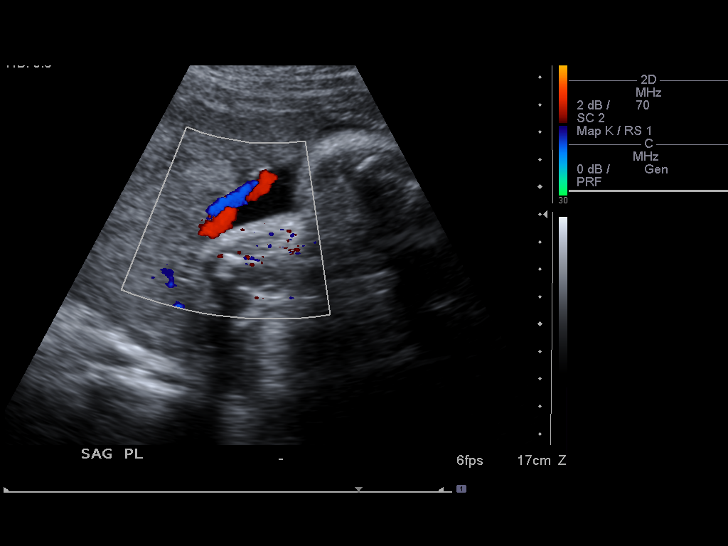
[im 20/32]
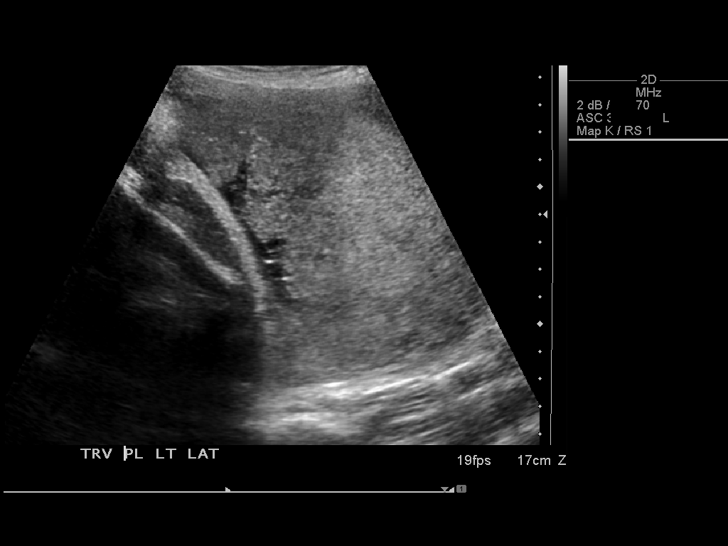
[im 22/32]
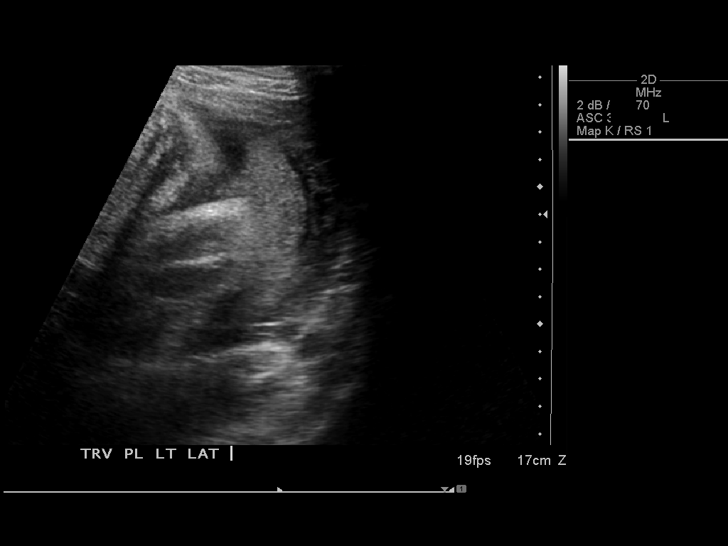
[im 25/32]
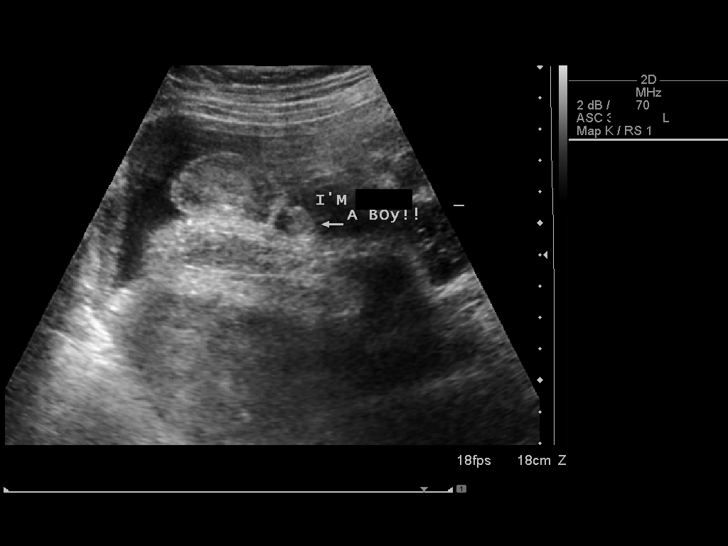
[im 27/32]
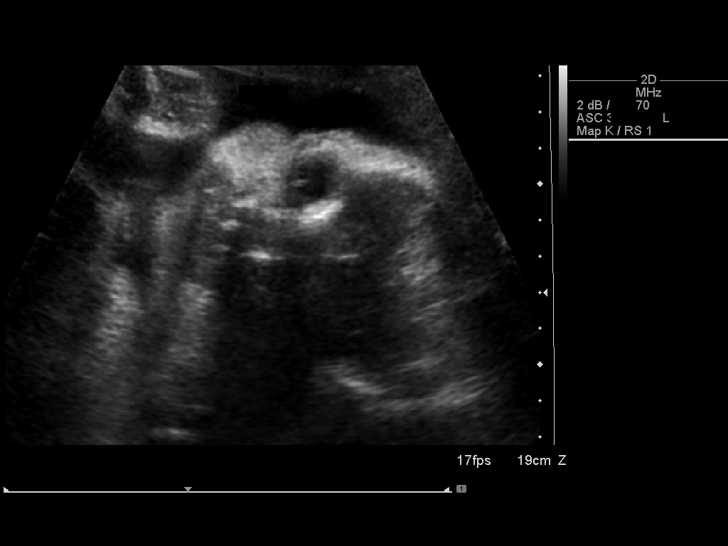
[im 29/32]
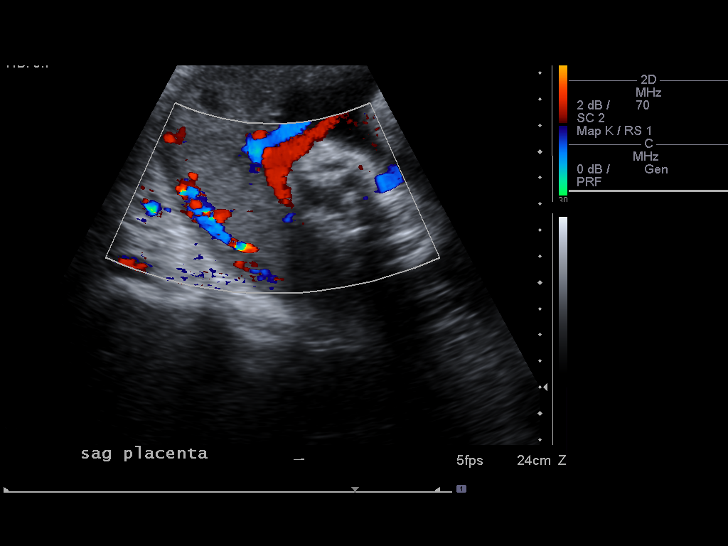
[im 32/32]
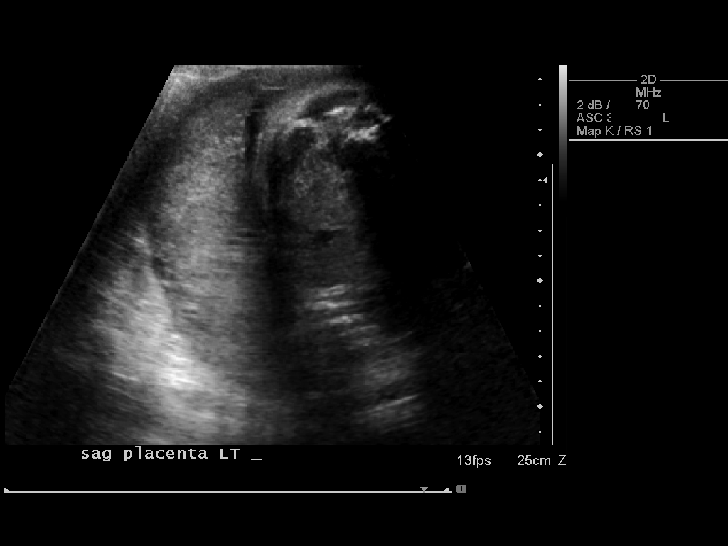

[14 of 28 positions shown; findings below may reference images not displayed]

IMPRESSION: See AS Obstetric US report.

## 2007-12-05 ENCOUNTER — Inpatient Hospital Stay (HOSPITAL_COMMUNITY): Admission: AD | Admit: 2007-12-05 | Discharge: 2007-12-05 | Payer: Self-pay | Admitting: Obstetrics and Gynecology

## 2007-12-25 ENCOUNTER — Inpatient Hospital Stay (HOSPITAL_COMMUNITY): Admission: AD | Admit: 2007-12-25 | Discharge: 2007-12-25 | Payer: Self-pay | Admitting: Obstetrics and Gynecology

## 2007-12-27 ENCOUNTER — Inpatient Hospital Stay (HOSPITAL_COMMUNITY): Admission: AD | Admit: 2007-12-27 | Discharge: 2007-12-30 | Payer: Self-pay | Admitting: Obstetrics and Gynecology

## 2007-12-28 ENCOUNTER — Encounter (INDEPENDENT_AMBULATORY_CARE_PROVIDER_SITE_OTHER): Payer: Self-pay | Admitting: Certified Nurse Midwife

## 2008-11-22 ENCOUNTER — Emergency Department (HOSPITAL_COMMUNITY): Admission: EM | Admit: 2008-11-22 | Discharge: 2008-11-22 | Payer: Self-pay | Admitting: Emergency Medicine

## 2008-12-20 ENCOUNTER — Emergency Department (HOSPITAL_COMMUNITY): Admission: EM | Admit: 2008-12-20 | Discharge: 2008-12-20 | Payer: Self-pay | Admitting: Family Medicine

## 2009-03-15 ENCOUNTER — Inpatient Hospital Stay (HOSPITAL_COMMUNITY): Admission: AD | Admit: 2009-03-15 | Discharge: 2009-03-15 | Payer: Self-pay | Admitting: Family Medicine

## 2009-07-17 ENCOUNTER — Emergency Department (HOSPITAL_COMMUNITY): Admission: EM | Admit: 2009-07-17 | Discharge: 2009-07-17 | Payer: Self-pay | Admitting: Emergency Medicine

## 2009-10-17 ENCOUNTER — Inpatient Hospital Stay (HOSPITAL_COMMUNITY): Admission: AD | Admit: 2009-10-17 | Discharge: 2009-10-17 | Payer: Self-pay | Admitting: Family Medicine

## 2009-10-31 ENCOUNTER — Ambulatory Visit: Payer: Self-pay | Admitting: Obstetrics & Gynecology

## 2009-10-31 LAB — CONVERTED CEMR LAB
Chlamydia, DNA Probe: POSITIVE — AB
GC Probe Amp, Genital: POSITIVE — AB

## 2009-11-06 ENCOUNTER — Ambulatory Visit: Payer: Self-pay | Admitting: Obstetrics & Gynecology

## 2010-01-04 ENCOUNTER — Ambulatory Visit: Payer: Self-pay | Admitting: Nurse Practitioner

## 2010-01-04 ENCOUNTER — Inpatient Hospital Stay (HOSPITAL_COMMUNITY): Admission: AD | Admit: 2010-01-04 | Discharge: 2010-01-04 | Payer: Self-pay | Admitting: Family Medicine

## 2010-01-22 ENCOUNTER — Emergency Department (HOSPITAL_COMMUNITY): Admission: EM | Admit: 2010-01-22 | Discharge: 2010-01-22 | Payer: Self-pay | Admitting: Emergency Medicine

## 2010-01-23 ENCOUNTER — Encounter (INDEPENDENT_AMBULATORY_CARE_PROVIDER_SITE_OTHER): Payer: Self-pay | Admitting: *Deleted

## 2010-01-23 ENCOUNTER — Ambulatory Visit: Payer: Self-pay | Admitting: Obstetrics & Gynecology

## 2010-01-23 LAB — CONVERTED CEMR LAB
Trich, Wet Prep: NONE SEEN
Yeast Wet Prep HPF POC: NONE SEEN

## 2010-01-25 ENCOUNTER — Emergency Department (HOSPITAL_COMMUNITY): Admission: EM | Admit: 2010-01-25 | Discharge: 2010-01-25 | Payer: Self-pay | Admitting: Family Medicine
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
## 2010-04-30 ENCOUNTER — Ambulatory Visit: Payer: Self-pay | Admitting: Nurse Practitioner

## 2010-04-30 ENCOUNTER — Inpatient Hospital Stay (HOSPITAL_COMMUNITY): Admission: AD | Admit: 2010-04-30 | Discharge: 2010-04-30 | Payer: Self-pay | Admitting: Obstetrics & Gynecology

## 2010-04-30 IMAGING — US US OB TRANSVAGINAL MODIFY
1 series · 14 of 28 positions shown · non-contrast
Comparison: none

OBSTETRICAL ULTRASOUND:
 This ultrasound exam was performed in the [HOSPITAL] Ultrasound Department.  The OB US report was generated in the AS system, and faxed to the ordering physician.  This report is also available in [HOSPITAL]?s [employer] and in [REDACTED] PACS.

[Series 1: us ob comp less 14 wks · 14 of 50 slices shown]
[im 2/50]
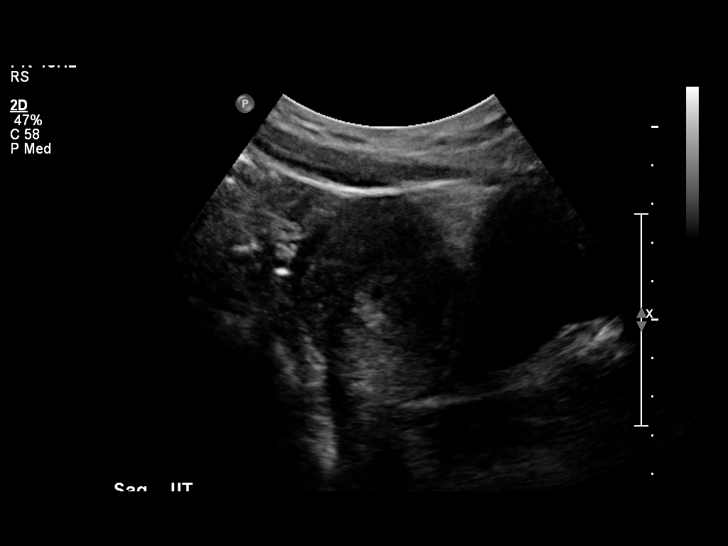
[im 6/50]
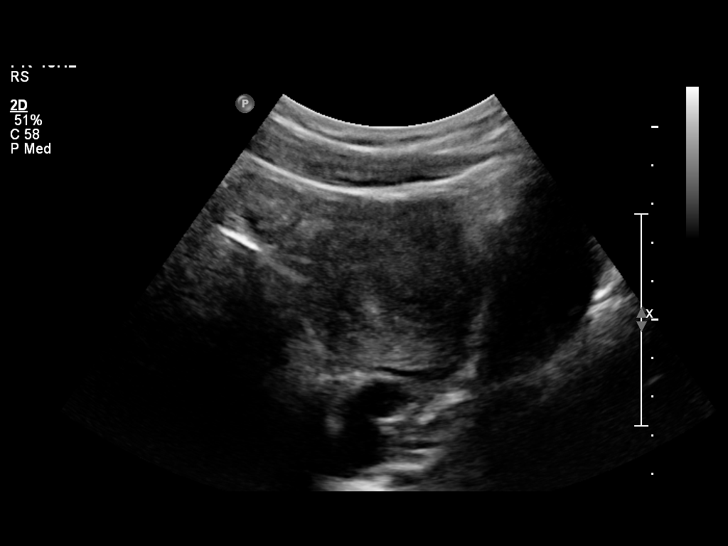
[im 10/50]
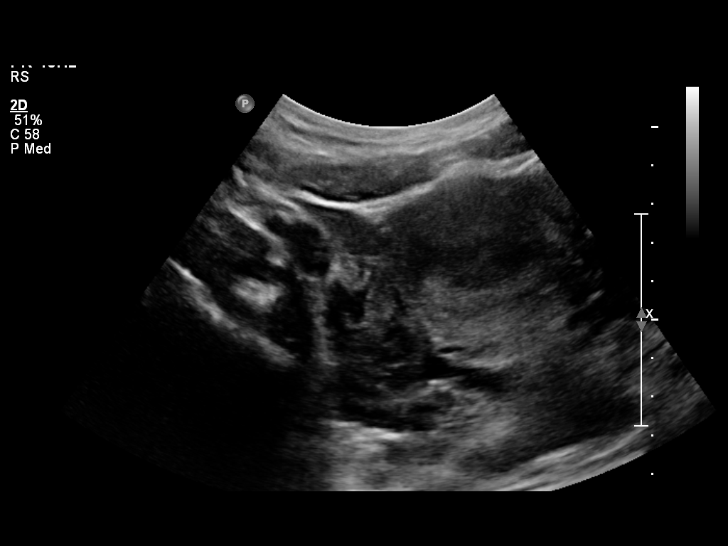
[im 13/50]
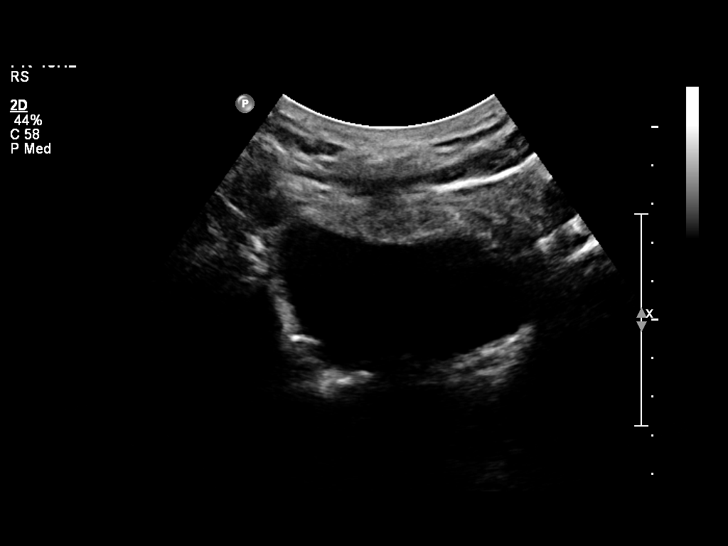
[im 17/50]
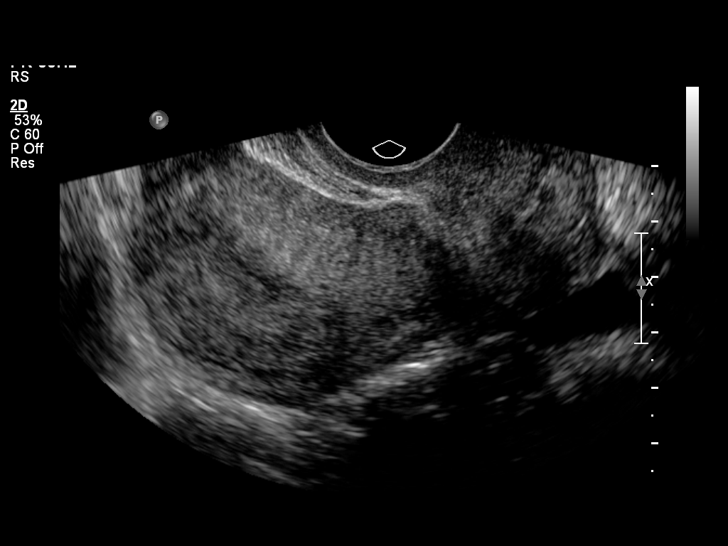
[im 20/50]
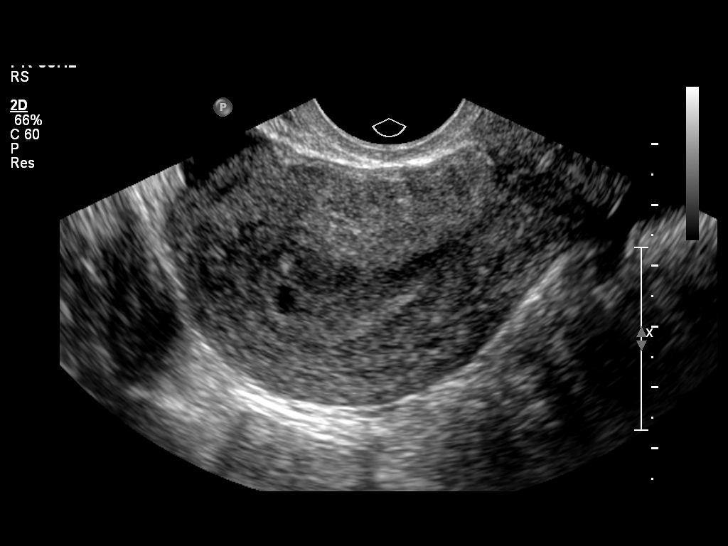
[im 24/50]
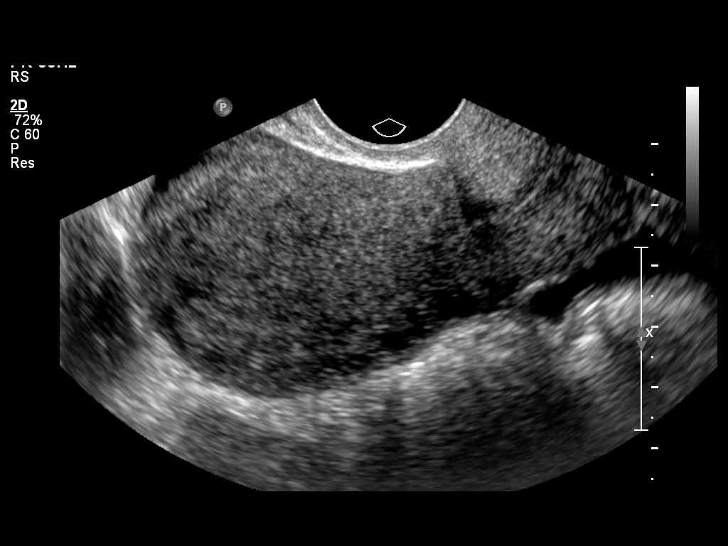
[im 28/50]
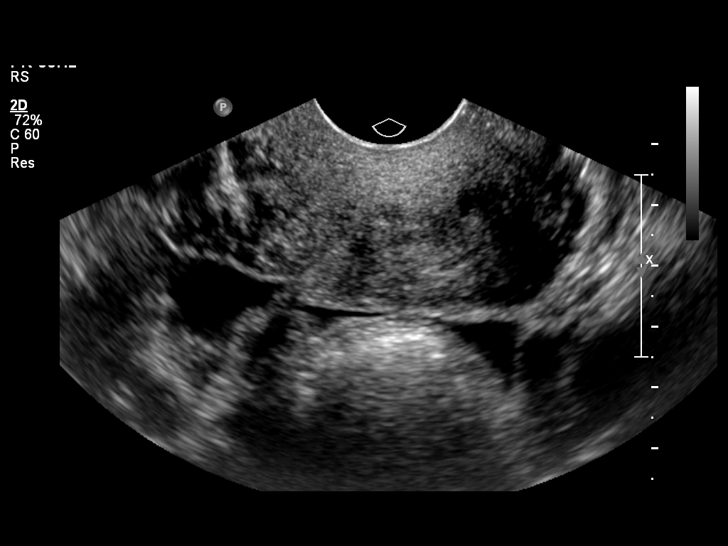
[im 31/50]
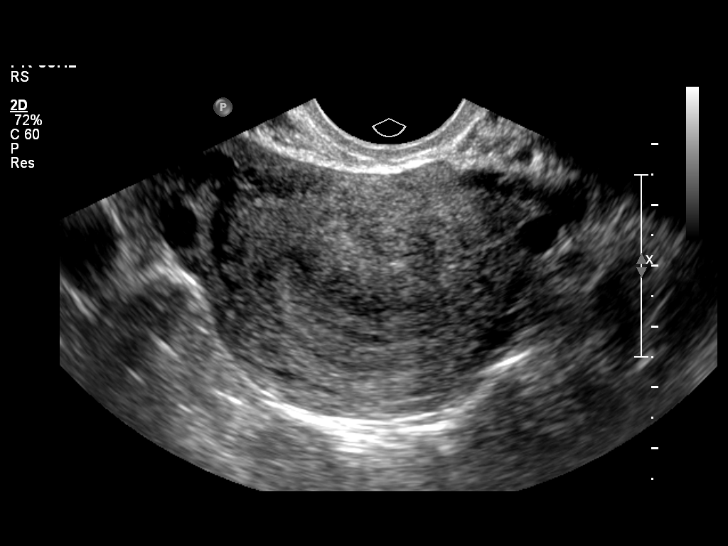
[im 35/50]
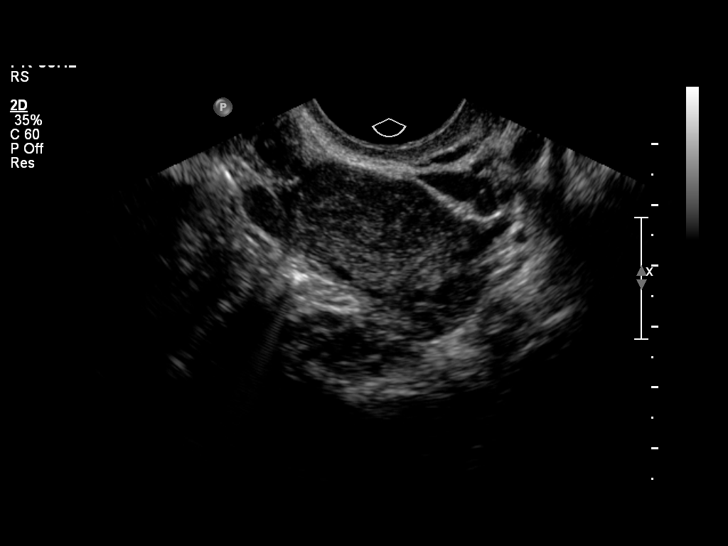
[im 39/50]
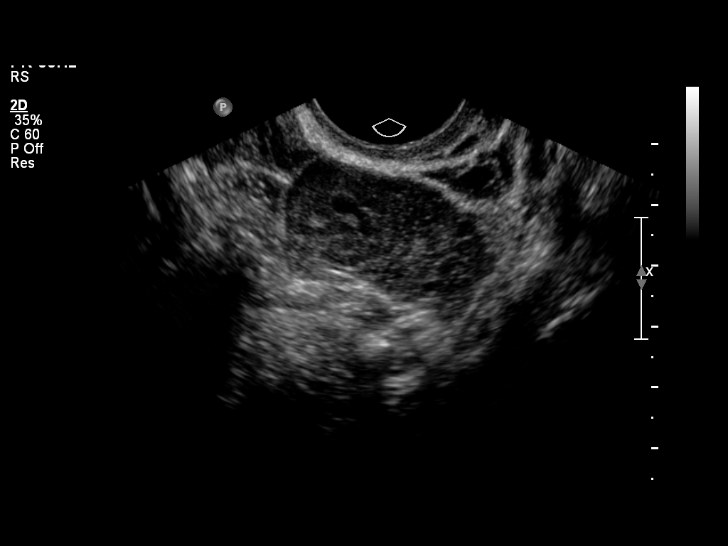
[im 42/50]
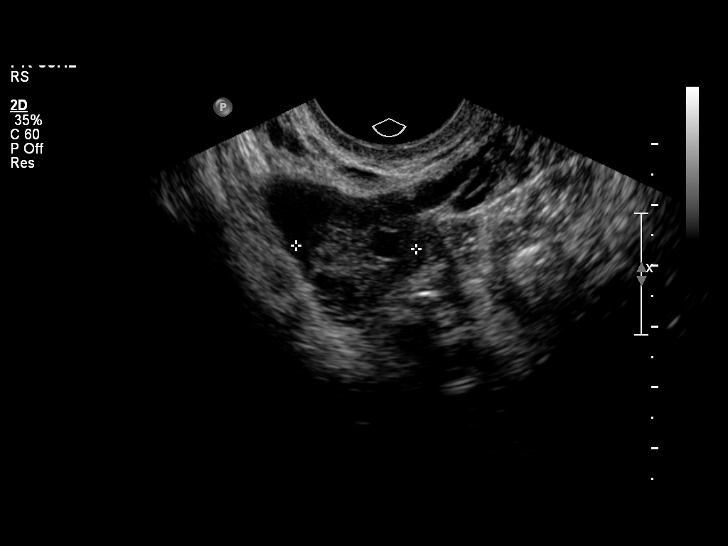
[im 46/50]
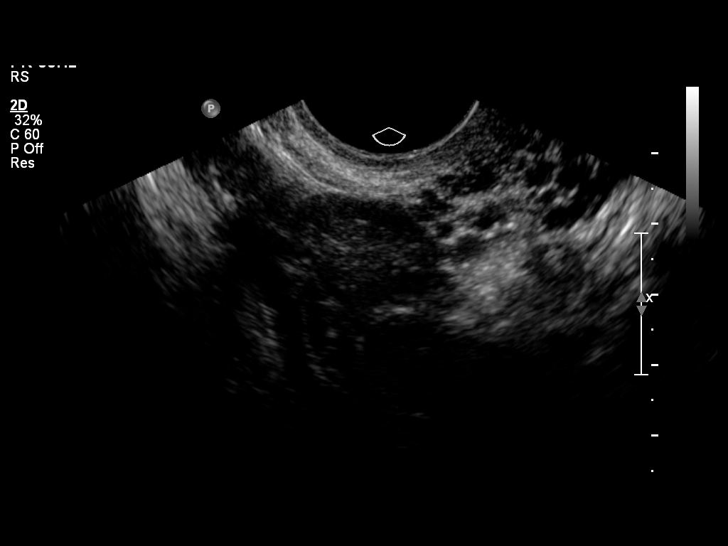
[im 50/50]
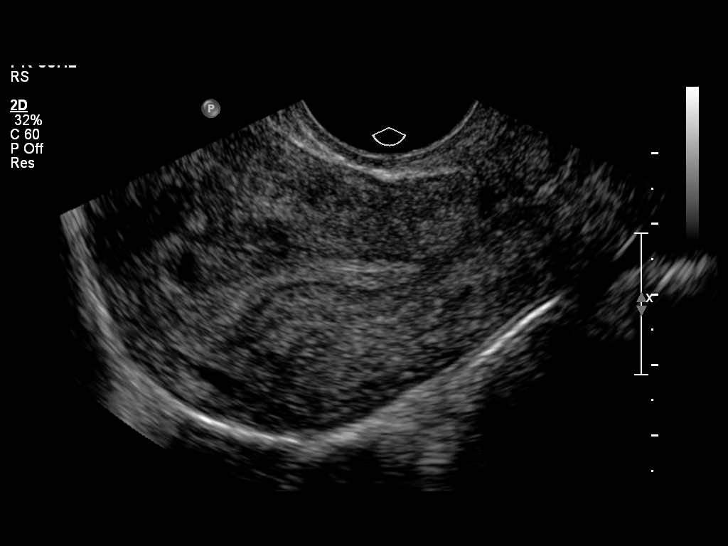

[14 of 28 positions shown; findings below may reference images not displayed]

IMPRESSION: See AS Obstetric US report.

## 2010-05-07 ENCOUNTER — Inpatient Hospital Stay (HOSPITAL_COMMUNITY): Admission: RE | Admit: 2010-05-07 | Discharge: 2010-05-07 | Payer: Self-pay | Admitting: Obstetrics & Gynecology

## 2010-05-07 IMAGING — US US OB TRANSVAGINAL
1 series · 14 of 28 positions shown · non-contrast
Comparison: none

OBSTETRICAL ULTRASOUND:
 This ultrasound exam was performed in the [HOSPITAL] Ultrasound Department.  The OB US report was generated in the AS system, and faxed to the ordering physician.  This report is also available in [HOSPITAL]?s [employer] and in [REDACTED] PACS.

[Series 1: us ob transvaginal · 0.15mm/px · 14 of 32 slices shown]
[im 2/32]
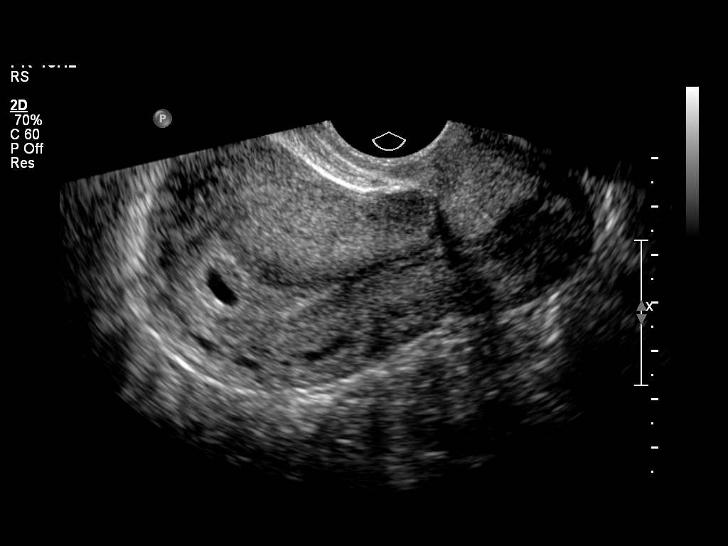
[im 4/32]
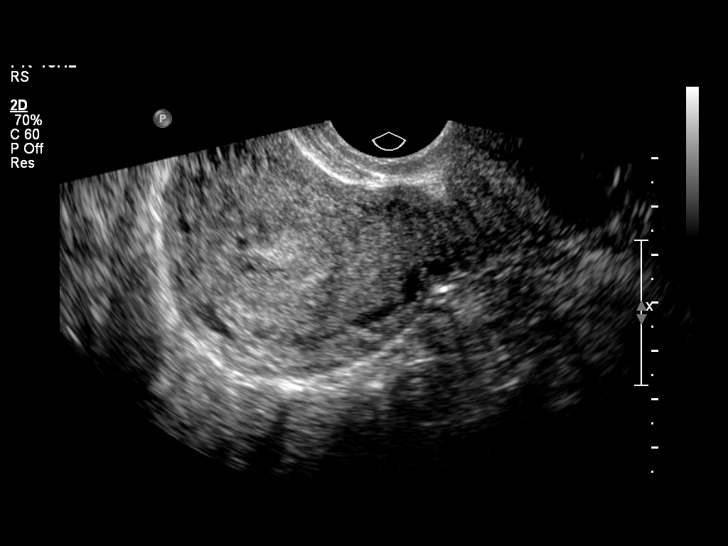
[im 6/32]
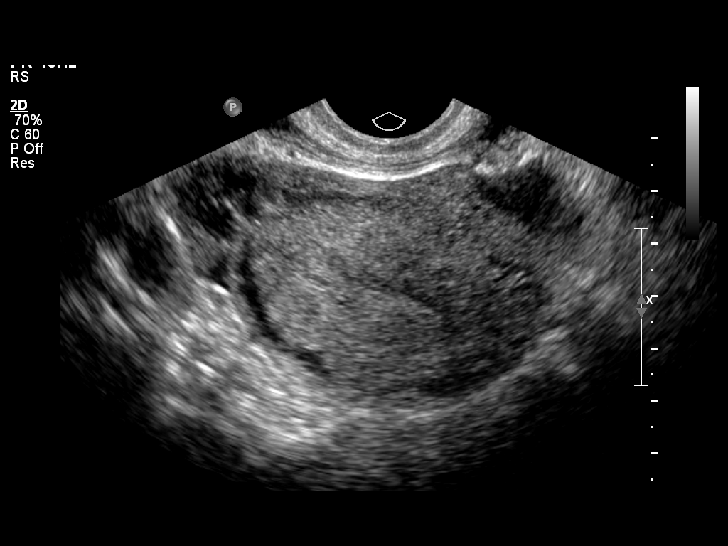
[im 9/32]
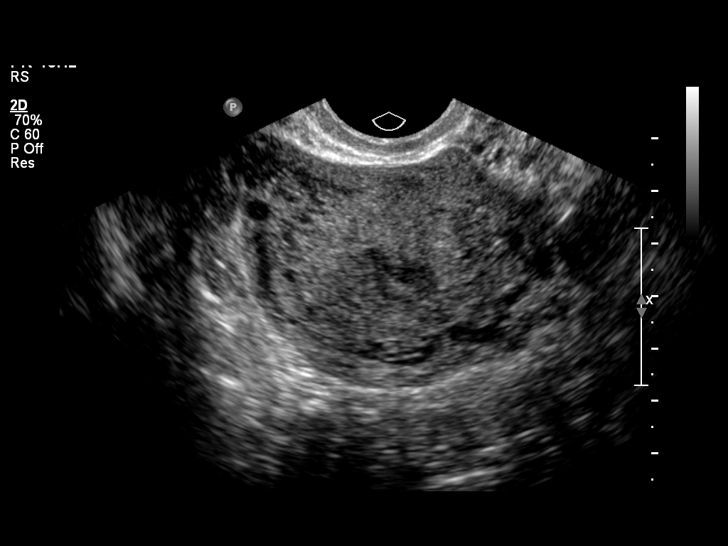
[im 11/32]
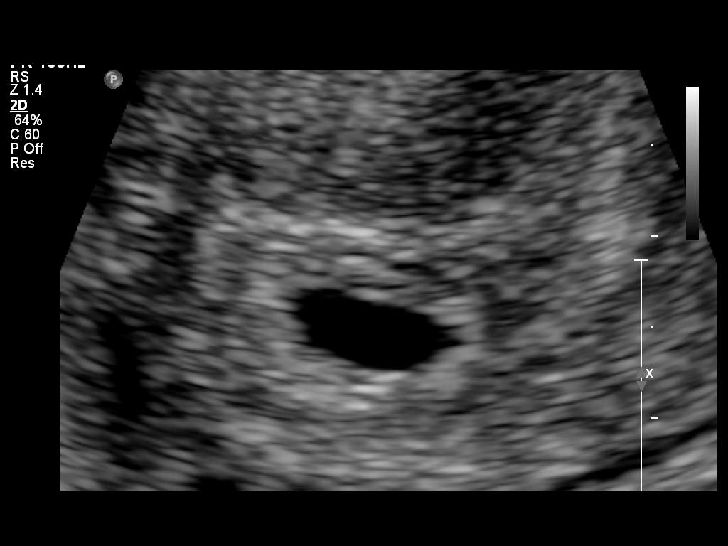
[im 13/32]
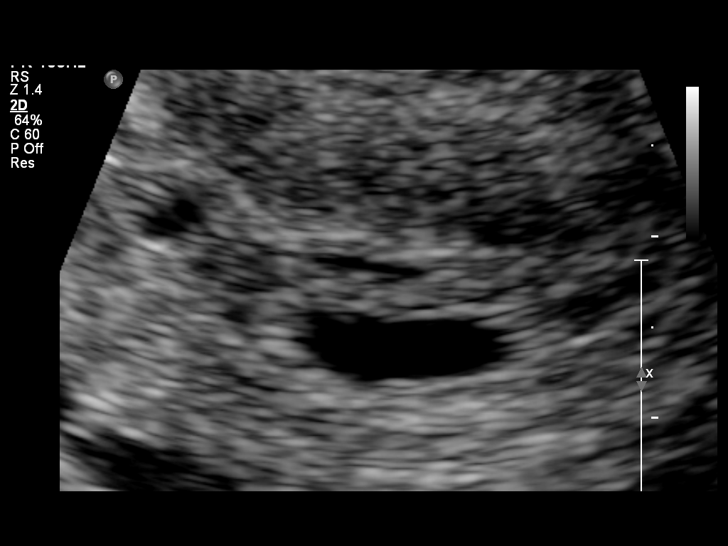
[im 15/32]
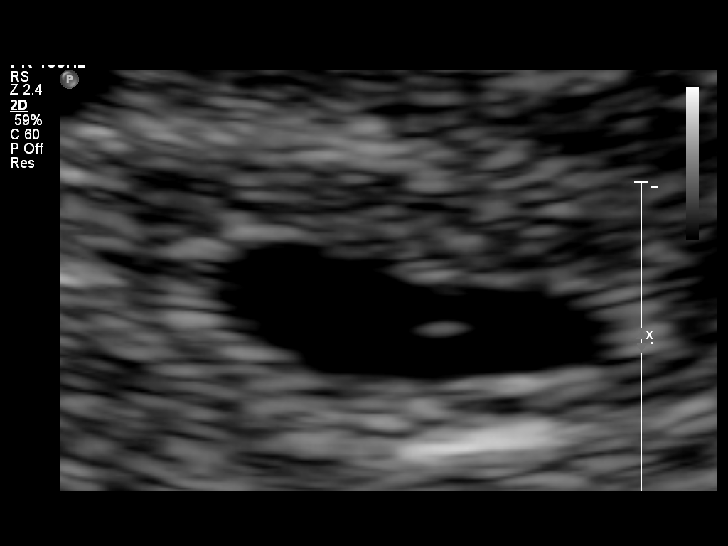
[im 18/32]
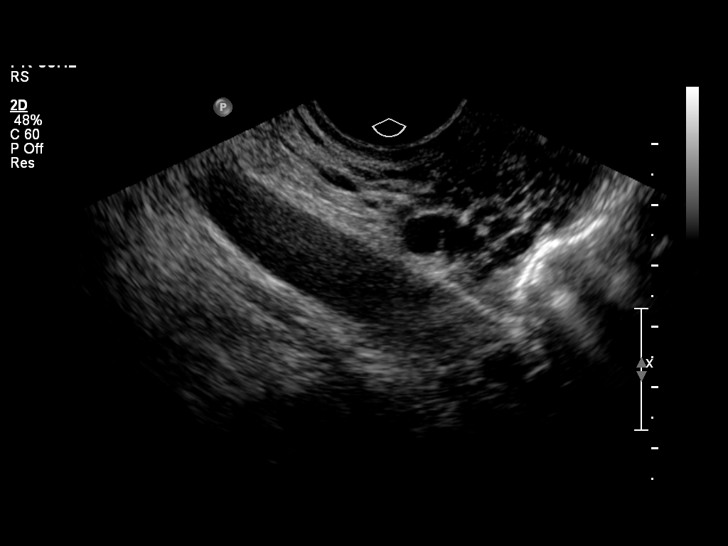
[im 20/32]
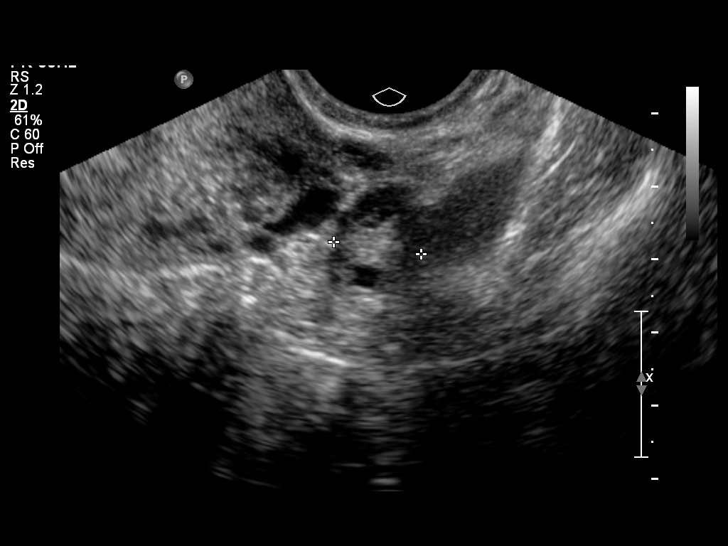
[im 22/32]
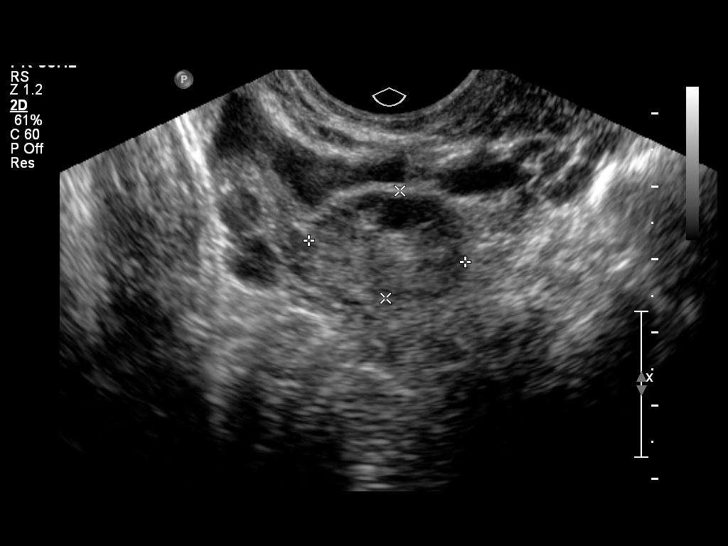
[im 25/32]
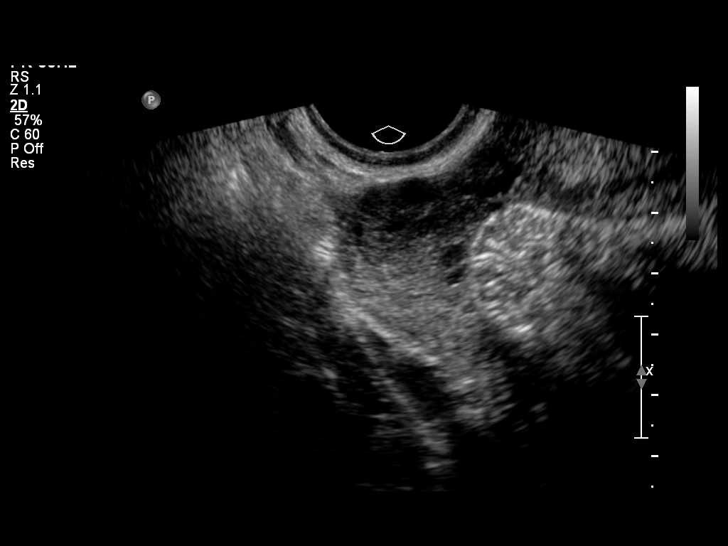
[im 27/32]
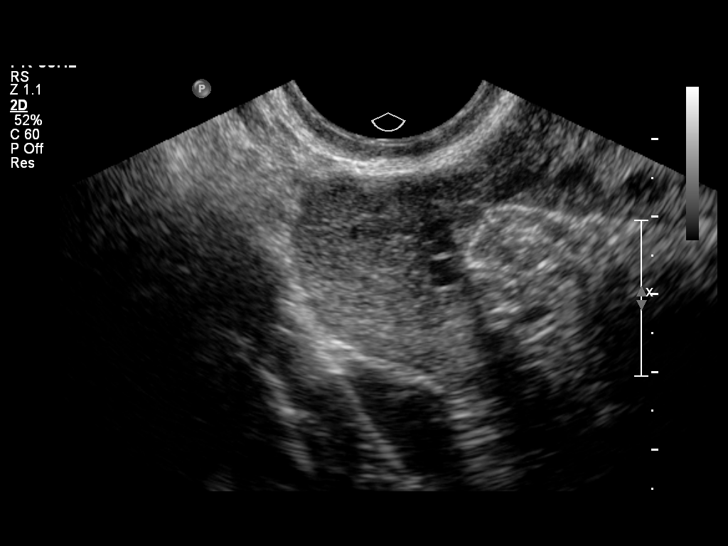
[im 29/32]
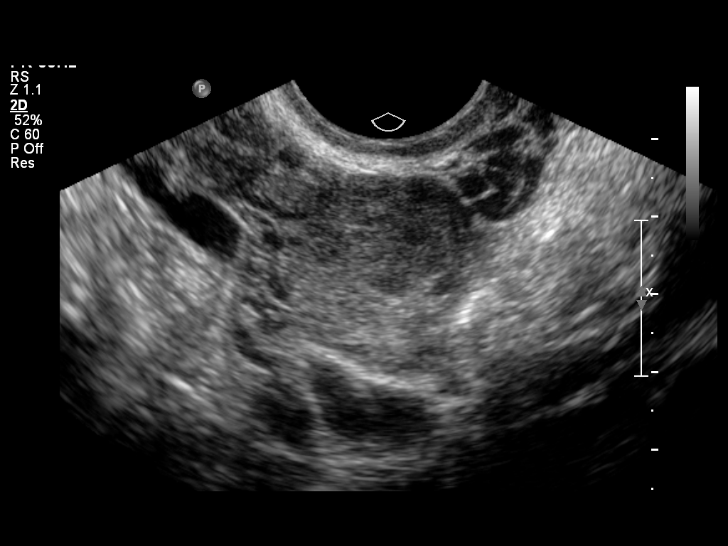
[im 32/32]
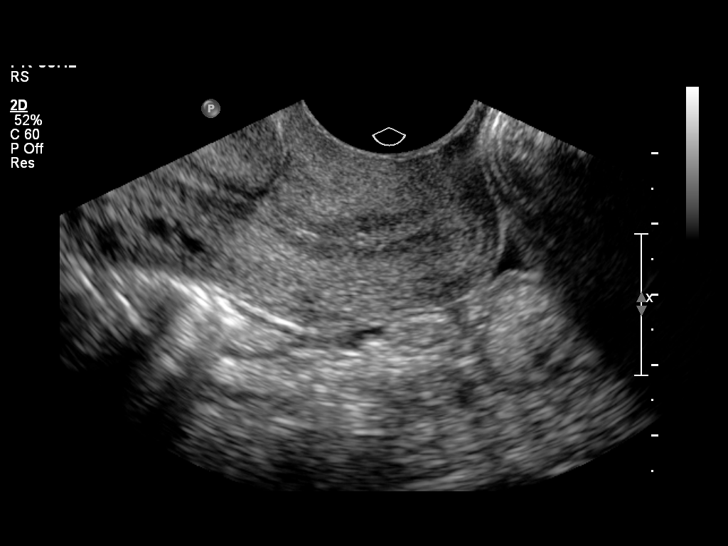

[14 of 28 positions shown; findings below may reference images not displayed]

IMPRESSION: See AS Obstetric US report.

## 2010-05-27 ENCOUNTER — Inpatient Hospital Stay (HOSPITAL_COMMUNITY): Admission: AD | Admit: 2010-05-27 | Discharge: 2010-05-27 | Payer: Self-pay | Admitting: Obstetrics & Gynecology

## 2010-08-11 ENCOUNTER — Encounter: Payer: Self-pay | Admitting: Family Medicine

## 2010-10-01 LAB — COMPREHENSIVE METABOLIC PANEL
ALT: 10 U/L (ref 0–35)
AST: 16 U/L (ref 0–37)
Albumin: 3.7 g/dL (ref 3.5–5.2)
Alkaline Phosphatase: 37 U/L — ABNORMAL LOW (ref 39–117)
BUN: 4 mg/dL — ABNORMAL LOW (ref 6–23)
CO2: 25 mEq/L (ref 19–32)
Calcium: 9.2 mg/dL (ref 8.4–10.5)
Chloride: 105 mEq/L (ref 96–112)
Creatinine, Ser: 0.65 mg/dL (ref 0.4–1.2)
GFR calc Af Amer: >60 mL/min (ref >60)
GFR calc non Af Amer: >60 mL/min (ref >60)
Glucose, Bld: 73 mg/dL (ref 70–99)
Potassium: 3.6 mEq/L (ref 3.5–5.1)
Sodium: 132 mEq/L — ABNORMAL LOW (ref 135–145)
Total Bilirubin: 0.3 mg/dL (ref 0.3–1.2)
Total Protein: 7.4 g/dL (ref 6.0–8.3)

## 2010-10-01 LAB — CBC
HCT: 40.3 % (ref 36.0–46.0)
Hemoglobin: 13.6 g/dL (ref 12.0–15.0)
MCH: 33.5 pg (ref 26.0–34.0)
MCHC: 33.7 g/dL (ref 30.0–36.0)
MCV: 99.5 fL (ref 78.0–100.0)
Platelets: 237 10*3/uL (ref 150–400)
RBC: 4.05 MIL/uL (ref 3.87–5.11)
RDW: 12.4 % (ref 11.5–15.5)
WBC: 9.7 10*3/uL (ref 4.0–10.5)

## 2010-10-01 LAB — WET PREP, GENITAL
Clue Cells Wet Prep HPF POC: NONE SEEN
Trich, Wet Prep: NONE SEEN

## 2010-10-01 LAB — AMYLASE: Amylase: 99 U/L (ref 0–105)

## 2010-10-01 LAB — URINALYSIS, ROUTINE W REFLEX MICROSCOPIC
Bilirubin Urine: NEGATIVE
Glucose, UA: NEGATIVE mg/dL
Hgb urine dipstick: NEGATIVE
Ketones, ur: NEGATIVE mg/dL
Nitrite: NEGATIVE
Protein, ur: NEGATIVE mg/dL
Specific Gravity, Urine: 1.015 (ref 1.005–1.030)
Urobilinogen, UA: 0.2 mg/dL (ref 0.0–1.0)
pH: 6.5 (ref 5.0–8.0)

## 2010-10-01 LAB — URINE CULTURE
Colony Count: 75000
Culture  Setup Time: 201111071631

## 2010-10-01 LAB — URINE MICROSCOPIC-ADD ON

## 2010-10-01 LAB — GC/CHLAMYDIA PROBE AMP, GENITAL
Chlamydia, DNA Probe: NEGATIVE
GC Probe Amp, Genital: NEGATIVE

## 2010-10-01 LAB — LIPASE, BLOOD: Lipase: 35 U/L (ref 11–59)

## 2010-10-02 LAB — WET PREP, GENITAL
Clue Cells Wet Prep HPF POC: NONE SEEN
Trich, Wet Prep: NONE SEEN
Yeast Wet Prep HPF POC: NONE SEEN

## 2010-10-02 LAB — CBC
HCT: 39.2 % (ref 36.0–46.0)
Hemoglobin: 13.2 g/dL (ref 12.0–15.0)
MCH: 33 pg (ref 26.0–34.0)
MCHC: 33.7 g/dL (ref 30.0–36.0)
MCV: 98.1 fL (ref 78.0–100.0)
Platelets: 226 10*3/uL (ref 150–400)
RBC: 3.99 MIL/uL (ref 3.87–5.11)
RDW: 12.1 % (ref 11.5–15.5)
WBC: 5.8 10*3/uL (ref 4.0–10.5)

## 2010-10-02 LAB — GC/CHLAMYDIA PROBE AMP, GENITAL
Chlamydia, DNA Probe: NEGATIVE
GC Probe Amp, Genital: NEGATIVE

## 2010-10-02 LAB — HCG, QUANTITATIVE, PREGNANCY: hCG, Beta Chain, Quant, S: 500 m[IU]/mL — ABNORMAL HIGH (ref <5)

## 2010-10-06 LAB — URINE CULTURE: Colony Count: >=100000

## 2010-10-06 LAB — URINALYSIS, ROUTINE W REFLEX MICROSCOPIC
Bilirubin Urine: NEGATIVE
Glucose, UA: NEGATIVE mg/dL
Hgb urine dipstick: NEGATIVE
Ketones, ur: NEGATIVE mg/dL
Nitrite: NEGATIVE
Protein, ur: NEGATIVE mg/dL
Specific Gravity, Urine: >1.03 — ABNORMAL HIGH (ref 1.005–1.030)
Urobilinogen, UA: 0.2 mg/dL (ref 0.0–1.0)
pH: 6 (ref 5.0–8.0)

## 2010-10-06 LAB — URINE MICROSCOPIC-ADD ON

## 2010-10-06 LAB — GC/CHLAMYDIA PROBE AMP, GENITAL
Chlamydia, DNA Probe: NEGATIVE
GC Probe Amp, Genital: NEGATIVE

## 2010-10-06 LAB — WET PREP, GENITAL
Clue Cells Wet Prep HPF POC: NONE SEEN
Trich, Wet Prep: NONE SEEN
Yeast Wet Prep HPF POC: NONE SEEN

## 2010-10-06 LAB — POCT PREGNANCY, URINE: Preg Test, Ur: NEGATIVE

## 2010-10-09 LAB — POCT URINALYSIS DIP (DEVICE)
Bilirubin Urine: NEGATIVE
Glucose, UA: NEGATIVE mg/dL
Hgb urine dipstick: NEGATIVE
Ketones, ur: NEGATIVE mg/dL
Nitrite: NEGATIVE
Protein, ur: NEGATIVE mg/dL
Specific Gravity, Urine: 1.02 (ref 1.005–1.030)
Urobilinogen, UA: 0.2 mg/dL (ref 0.0–1.0)
pH: 7 (ref 5.0–8.0)

## 2010-10-13 LAB — URINALYSIS, ROUTINE W REFLEX MICROSCOPIC
Bilirubin Urine: NEGATIVE
Bilirubin Urine: NEGATIVE
Glucose, UA: NEGATIVE mg/dL
Glucose, UA: NEGATIVE mg/dL
Ketones, ur: NEGATIVE mg/dL
Ketones, ur: NEGATIVE mg/dL
Nitrite: NEGATIVE
Nitrite: NEGATIVE
Protein, ur: NEGATIVE mg/dL
Protein, ur: NEGATIVE mg/dL
Specific Gravity, Urine: 1.01 (ref 1.005–1.030)
Specific Gravity, Urine: 1.015 (ref 1.005–1.030)
Urobilinogen, UA: 0.2 mg/dL (ref 0.0–1.0)
Urobilinogen, UA: 0.2 mg/dL (ref 0.0–1.0)
pH: 6.5 (ref 5.0–8.0)
pH: 6.5 (ref 5.0–8.0)

## 2010-10-13 LAB — URINE CULTURE: Colony Count: >=100000

## 2010-10-13 LAB — URINE MICROSCOPIC-ADD ON

## 2010-10-13 LAB — GC/CHLAMYDIA PROBE AMP, GENITAL
Chlamydia, DNA Probe: NEGATIVE
GC Probe Amp, Genital: POSITIVE — AB

## 2010-10-13 LAB — WET PREP, GENITAL
Clue Cells Wet Prep HPF POC: NONE SEEN
Trich, Wet Prep: NONE SEEN
Yeast Wet Prep HPF POC: NONE SEEN

## 2010-10-13 LAB — POCT PREGNANCY, URINE: Preg Test, Ur: NEGATIVE

## 2010-10-21 LAB — POCT RAPID STREP A (OFFICE): Streptococcus, Group A Screen (Direct): NEGATIVE

## 2010-10-26 LAB — URINALYSIS, ROUTINE W REFLEX MICROSCOPIC
Bilirubin Urine: NEGATIVE
Glucose, UA: NEGATIVE mg/dL
Hgb urine dipstick: NEGATIVE
Ketones, ur: NEGATIVE mg/dL
Nitrite: NEGATIVE
Protein, ur: NEGATIVE mg/dL
Specific Gravity, Urine: 1.025 (ref 1.005–1.030)
Urobilinogen, UA: 0.2 mg/dL (ref 0.0–1.0)
pH: 6.5 (ref 5.0–8.0)

## 2010-10-26 LAB — WET PREP, GENITAL
Trich, Wet Prep: NONE SEEN
Yeast Wet Prep HPF POC: NONE SEEN

## 2010-10-26 LAB — POCT PREGNANCY, URINE: Preg Test, Ur: NEGATIVE

## 2010-10-26 LAB — DIFFERENTIAL
Basophils Absolute: 0 10*3/uL (ref 0.0–0.1)
Basophils Relative: 0 % (ref 0–1)
Eosinophils Absolute: 0.1 10*3/uL (ref 0.0–0.7)
Eosinophils Relative: 1 % (ref 0–5)
Lymphocytes Relative: 31 % (ref 12–46)
Lymphs Abs: 2.5 10*3/uL (ref 0.7–4.0)
Monocytes Absolute: 0.5 10*3/uL (ref 0.1–1.0)
Monocytes Relative: 6 % (ref 3–12)
Neutro Abs: 4.8 10*3/uL (ref 1.7–7.7)
Neutrophils Relative %: 62 % (ref 43–77)

## 2010-10-26 LAB — CBC
HCT: 39.2 % (ref 36.0–46.0)
Hemoglobin: 13.1 g/dL (ref 12.0–15.0)
MCHC: 33.3 g/dL (ref 30.0–36.0)
MCV: 98.9 fL (ref 78.0–100.0)
Platelets: 247 10*3/uL (ref 150–400)
RBC: 3.97 MIL/uL (ref 3.87–5.11)
RDW: 13 % (ref 11.5–15.5)
WBC: 7.9 10*3/uL (ref 4.0–10.5)

## 2010-10-26 LAB — GC/CHLAMYDIA PROBE AMP, GENITAL
Chlamydia, DNA Probe: NEGATIVE
GC Probe Amp, Genital: NEGATIVE

## 2010-10-28 LAB — POCT RAPID STREP A (OFFICE): Streptococcus, Group A Screen (Direct): POSITIVE — AB

## 2010-12-06 NOTE — Discharge Summary (Signed)
NAME:  Andrea Dean, Andrea Dean                 ACCOUNT NO.:  [id number]   MEDICAL RECORD NO.:  [id number]          PATIENT TYPE:  INP   LOCATION:  9102                          FACILITY:  [facility]   PHYSICIAN:  Charles A. Clearance Coots, M.D.DATE OF BIRTH:  May 16, 1986   DATE OF ADMISSION:  07/20/2004  DATE OF DISCHARGE:  07/24/2004                                 DISCHARGE SUMMARY   ADMITTING DIAGNOSES:  1.  Term pregnancy.  2.  Active labor.  3.  Arrest of labor.   Status post a primary low transverse cesarean section on July 21, 2004 at  0001 with delivery of a viable female infant, Apgar's of 8 at 1 minute and 9  at 5 minutes, weight of 2980 g, length of 50 cm.  Mother and infant  discharged home in good condition.   REASON FOR ADMISSION:  This is an 25 year old, G1, estimated date of  confinement of July 24, 2004, who presented to [facility]  complaining of rupture of membranes and uterine contractions.  Prenatal care  was uncomplicated.   PAST MEDICAL HISTORY:  1.  Surgeries - Wisdom teeth removal.  2.  Illnesses - None.  3.  Medications - Prenatal vitamins.   ALLERGIES:  No known drug allergies.   SOCIAL HISTORY:  Single, negative tobacco, alcohol or recreational drug use.   PHYSICAL EXAMINATION:  GENERAL:  Well-nourished, well-developed black female  in no acute distress.  VITAL SIGNS:  Temperature 98.5; pulse 114; blood pressure 128/84; height 5  feet 3 inches; weight 128 pounds.  LUNGS:  Clear to auscultation bilaterally.  HEART:  Regular rate and rhythm.  ABDOMEN:  Gravid, nontender.  CERVIX:  5 cm dilated, 90% effaced and the vertex was at a -1 to 0 station.   IMPRESSION:  Term pregnancy/active labor.   PLAN:  Admit.   HOSPITAL COURSE:  Patient progressed to 8 cm dilatation and made no further  cervical change for greater than 3 1/2 hours after that progression and it  was decided to proceed with cesarean section delivery for failure to  progress in labor.  Patient  underwent a primary low transverse cesarean  section without complications.  The postoperative course was uncomplicated  and she was discharged home on postoperative day number three in good  condition.   DISCHARGE LABORATORY VALUES:  Hemoglobin 8, hematocrit 24, white blood cell  count 13,000, platelets 235,000.   DISCHARGE DISPOSITION:  1.  Medications - Continue prenatal vitamins and iron was prescribed for      anemia, which was very stable at discharge.  Ibuprofen and Tylox were      prescribed for pain.  2.  Routine written instructions were given for discharge after cesarean      section.  3.  The patient is to call the office for a follow-up appointment in two      weeks.     Char   CAH/MEDQ  D:  07/24/2004  T:  07/24/2004  Job:  [id number]

## 2010-12-06 NOTE — Op Note (Signed)
NAMEMarland Kitchen  DONATELLA, WALSKI NO.:  [id number]   MEDICAL RECORD NO.:  [id number]          PATIENT TYPE:  INP   LOCATION:  9171                          FACILITY:  [facility]   PHYSICIAN:  Kathreen Cosier, M.D.DATE OF BIRTH:  Jul 25, 1985   DATE OF PROCEDURE:  07/21/2004  DATE OF DISCHARGE:                                 OPERATIVE REPORT   SURGEON:  Kathreen Cosier, M.D.   ANESTHESIA:  Epidural.   DESCRIPTION OF PROCEDURE:  The patient was placed on the operating table in  the supine position.  Abdomen prepped and draped.  Bladder emptied with a  Foley catheter.  Transverse suprapubic incision made and carried down to the  rectus fascia.  Fascia cleaned and incised the length of the incision.  Recti muscles retracted laterally.  Peritoneum incised longitudinally.  Transverse incision made in the visceral peritoneum above the bladder, and  the bladder immobilized inferiorly.  A transverse lower uterine incision was  made.  Fluid clear.  The patient was delivered from the OP position of a  female, Apgars 8 and 9, weighing 6 pounds 9 ounces.  Placenta was posterior,  moved manually and sent to pathology.  It was noted that the cord was  wrapped around the back and on the baby and also around both arms.  The  uterine cavity was dry laps.  Uterine incision closed in one layer with  continuous sutures of #1 chromic.  Hemostasis satisfactory.  Bladder flap  reattached with 2-0 chromic.  Uterine was contracted.  Tubes and ovaries  normal.  Abdomen closed in layers.  Peritoneum continuous with 0 chromic.  Fascia continuous with 2-0 Dexon.  Skin closed with subcuticular stitch of 4-  0 Monocryl.  Blood loss 500 mL.      BAM/MEDQ  D:  07/21/2004  T:  07/21/2004  Job:  [id number]

## 2010-12-06 NOTE — H&P (Signed)
NAME:  ASTRIA, JORDAHL NO.:  [id number]   MEDICAL RECORD NO.:  [id number]          PATIENT TYPE:  INP   LOCATION:  9102                          FACILITY:  [facility]   PHYSICIAN:  Kathreen Cosier, M.D.DATE OF BIRTH:  1986/05/01   DATE OF ADMISSION:  07/20/2004  DATE OF DISCHARGE:                                HISTORY & PHYSICAL   HISTORY OF PRESENT ILLNESS:  The patient is an 25 year old prima gravida,  EDC July 24, 2004.  She was admitted in labor with ruptured membranes  which occurred at 2 p.m. on July 20, 2004.  Her cervix was 5 cm, 90%, 0  to -1 station, negative GBS.  By 6:15 p.m., the patient was 6 cm, 9% and was  started on Pitocin.  By 11:30 p.m., she was 8 cm and had been in adequate  labor, and there was no change in her cervix the prior 3-1/2 hours.  It was  decided that she could deliver by a cesarean section for failure to progress  in labor.   PHYSICAL EXAMINATION:  GENERAL:  Well-developed female in labor.  HEENT:  Negative.  LUNGS:  Clear.  HEART:  Regular rhythm.  No murmurs, no gallops.  ABDOMEN:  Term size uterus.  Estimated fetal weight 7 pounds.  PELVIC:  Findings as above.  EXTREMITIES:  Negative.      BAM/MEDQ  D:  07/21/2004  T:  07/21/2004  Job:  [id number]

## 2011-01-29 ENCOUNTER — Inpatient Hospital Stay (INDEPENDENT_AMBULATORY_CARE_PROVIDER_SITE_OTHER)
Admission: RE | Admit: 2011-01-29 | Discharge: 2011-01-29 | Disposition: A | Discharge status: Home / Self Care | Payer: Self-pay | Source: Ambulatory Visit | Attending: Family Medicine | Admitting: Family Medicine

## 2011-01-29 DIAGNOSIS — N390 Urinary tract infection, site not specified: Secondary | ICD-10-CM

## 2011-01-29 LAB — POCT URINALYSIS DIP (DEVICE)
Bilirubin Urine: NEGATIVE
Glucose, UA: NEGATIVE mg/dL
Ketones, ur: NEGATIVE mg/dL
Nitrite: POSITIVE — AB
Protein, ur: 100 mg/dL — AB
Specific Gravity, Urine: 1.02 (ref 1.005–1.030)
Urobilinogen, UA: 0.2 mg/dL (ref 0.0–1.0)
pH: 7 (ref 5.0–8.0)

## 2011-01-29 LAB — POCT PREGNANCY, URINE: Preg Test, Ur: NEGATIVE

## 2011-02-03 ENCOUNTER — Inpatient Hospital Stay (HOSPITAL_COMMUNITY): Payer: Medicaid Other

## 2011-02-03 ENCOUNTER — Encounter (HOSPITAL_COMMUNITY): Payer: Self-pay | Admitting: *Deleted

## 2011-02-03 ENCOUNTER — Inpatient Hospital Stay (HOSPITAL_COMMUNITY)
Admission: AD | Admit: 2011-02-03 | Discharge: 2011-02-03 | Disposition: A | Discharge status: Home / Self Care | Payer: Medicaid Other | Source: Ambulatory Visit | Attending: Obstetrics and Gynecology | Admitting: Obstetrics and Gynecology

## 2011-02-03 DIAGNOSIS — R51 Headache: Secondary | ICD-10-CM | POA: Insufficient documentation

## 2011-02-03 DIAGNOSIS — O26899 Other specified pregnancy related conditions, unspecified trimester: Secondary | ICD-10-CM

## 2011-02-03 DIAGNOSIS — N949 Unspecified condition associated with female genital organs and menstrual cycle: Secondary | ICD-10-CM | POA: Insufficient documentation

## 2011-02-03 DIAGNOSIS — R1032 Left lower quadrant pain: Secondary | ICD-10-CM

## 2011-02-03 DIAGNOSIS — N938 Other specified abnormal uterine and vaginal bleeding: Secondary | ICD-10-CM

## 2011-02-03 LAB — URINALYSIS, ROUTINE W REFLEX MICROSCOPIC
Bilirubin Urine: NEGATIVE
Glucose, UA: NEGATIVE mg/dL
Hgb urine dipstick: NEGATIVE
Ketones, ur: NEGATIVE mg/dL
Leukocytes, UA: NEGATIVE
Nitrite: NEGATIVE
Protein, ur: NEGATIVE mg/dL
Specific Gravity, Urine: >1.03 — ABNORMAL HIGH (ref 1.005–1.030)
Urobilinogen, UA: 0.2 mg/dL (ref 0.0–1.0)
pH: 6 (ref 5.0–8.0)

## 2011-02-03 LAB — DIFFERENTIAL
Basophils Absolute: 0 10*3/uL (ref 0.0–0.1)
Basophils Relative: 1 % (ref 0–1)
Eosinophils Absolute: 0.1 10*3/uL (ref 0.0–0.7)
Eosinophils Relative: 1 % (ref 0–5)
Lymphocytes Relative: 41 % (ref 12–46)
Lymphs Abs: 3.1 10*3/uL (ref 0.7–4.0)
Monocytes Absolute: 0.6 10*3/uL (ref 0.1–1.0)
Monocytes Relative: 8 % (ref 3–12)
Neutro Abs: 3.7 10*3/uL (ref 1.7–7.7)
Neutrophils Relative %: 50 % (ref 43–77)

## 2011-02-03 LAB — CBC
HCT: 40.3 % (ref 36.0–46.0)
Hemoglobin: 13.8 g/dL (ref 12.0–15.0)
MCH: 31.9 pg (ref 26.0–34.0)
MCHC: 34.2 g/dL (ref 30.0–36.0)
MCV: 93.1 fL (ref 78.0–100.0)
Platelets: 248 10*3/uL (ref 150–400)
RBC: 4.33 MIL/uL (ref 3.87–5.11)
RDW: 12.1 % (ref 11.5–15.5)
WBC: 7.4 10*3/uL (ref 4.0–10.5)

## 2011-02-03 LAB — POCT PREGNANCY, URINE: Preg Test, Ur: NEGATIVE

## 2011-02-03 LAB — WET PREP, GENITAL
Clue Cells Wet Prep HPF POC: NONE SEEN
Trich, Wet Prep: NONE SEEN
Yeast Wet Prep HPF POC: NONE SEEN

## 2011-02-03 LAB — HCG, SERUM, QUALITATIVE: Preg, Serum: POSITIVE — AB

## 2011-02-03 LAB — HCG, QUANTITATIVE, PREGNANCY: hCG, Beta Chain, Quant, S: 19 m[IU]/mL — ABNORMAL HIGH (ref <5)

## 2011-02-03 IMAGING — US US OB TRANSVAGINAL
1 series · 14 of 28 positions shown · non-contrast
Comparison: [DATE]

CLINICAL DATA: Positive pregnancy test with left lower quadrant
pain.

OBSTETRIC <14 WK US AND TRANSVAGINAL OB US
TECHNIQUE: Both transabdominal and transvaginal ultrasound
examinations were performed for complete evaluation of the
gestation as well as the maternal uterus, adnexal regions, and
pelvic cul-de-sac.  Transvaginal technique was performed to assess
early pregnancy.

[Series 1: us ob comp less 14 wks · 14 of 41 slices shown]
[im 2/41]
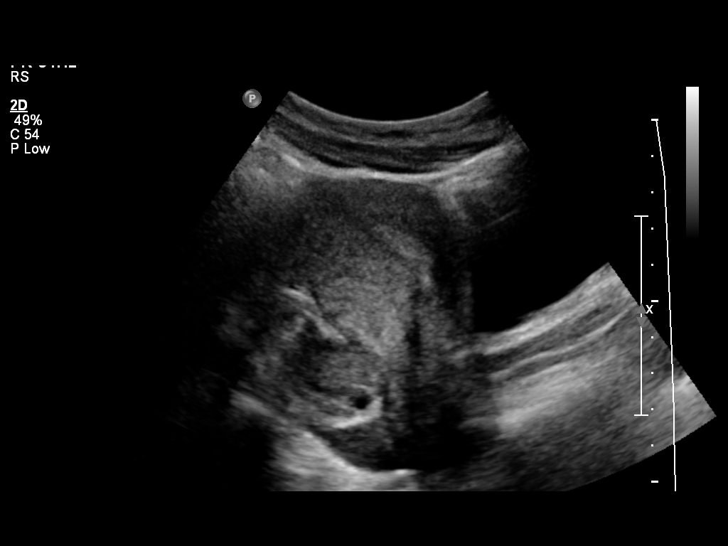
[im 5/41]
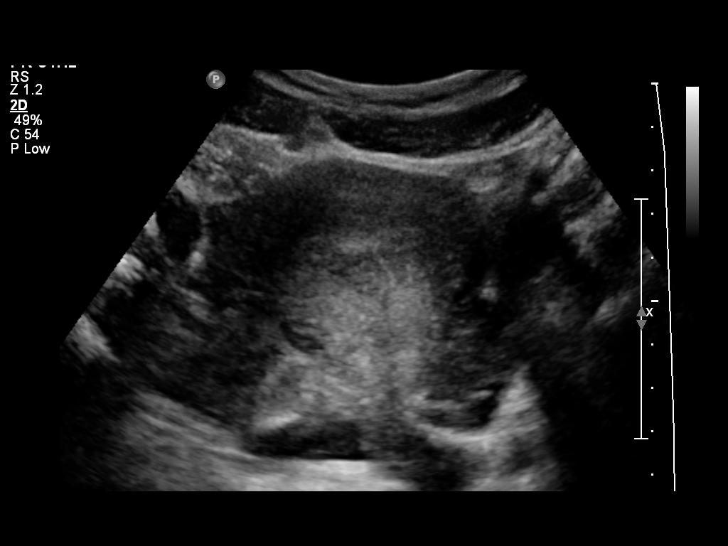
[im 8/41]
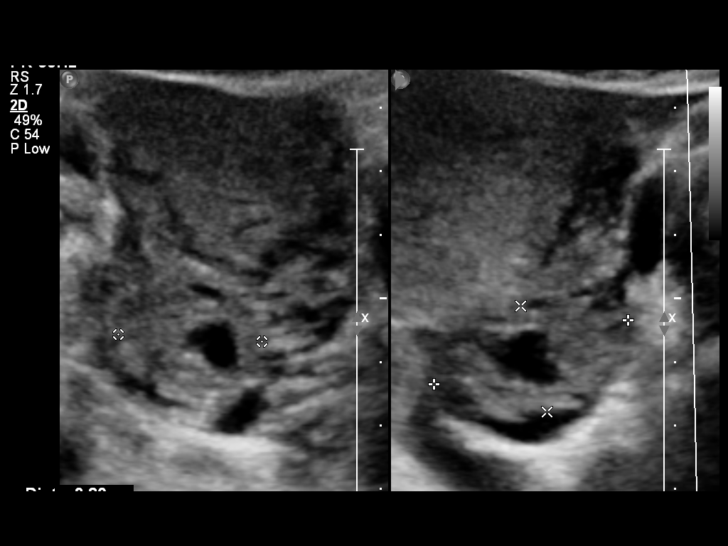
[im 11/41]
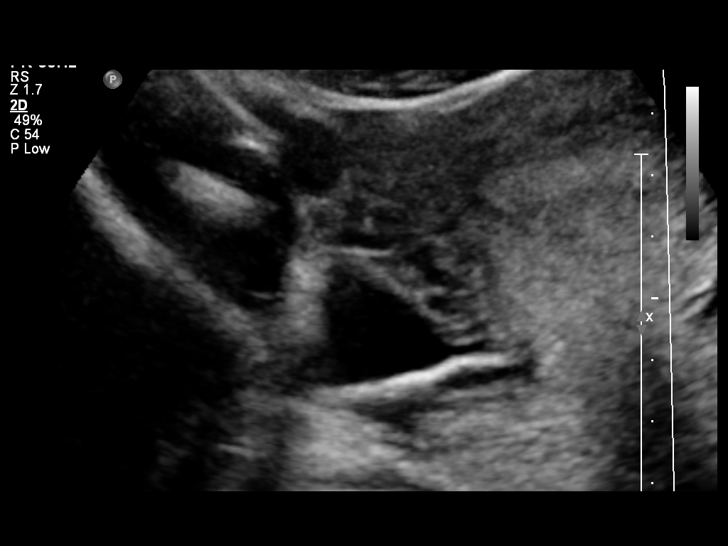
[im 14/41]
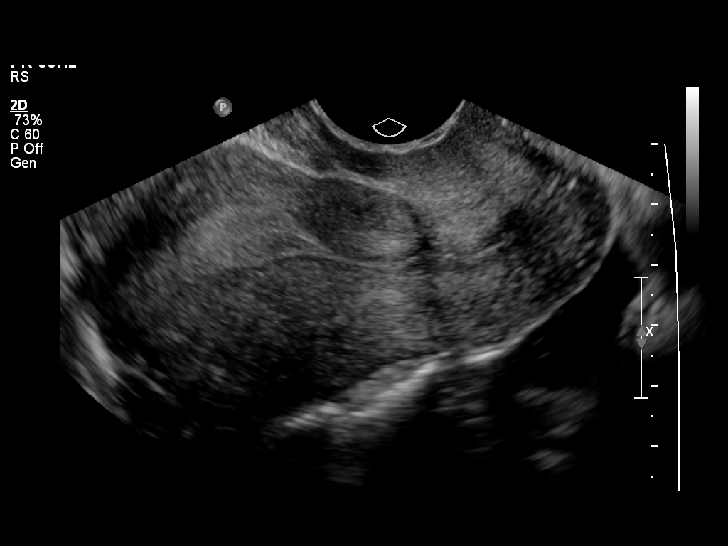
[im 17/41]
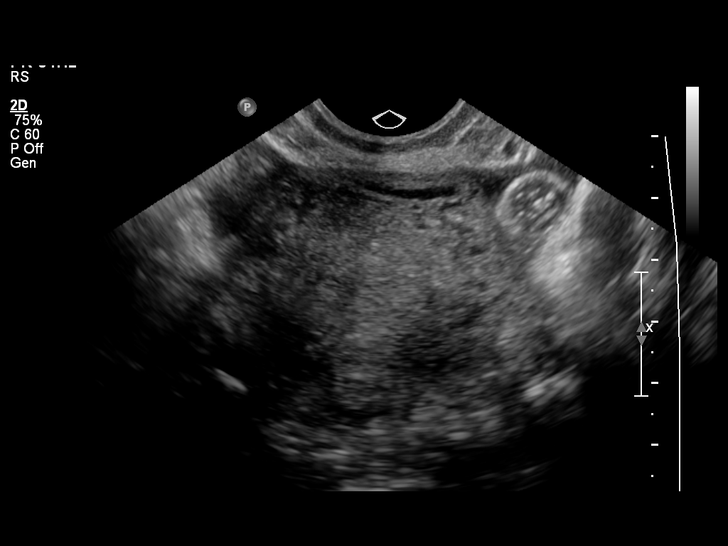
[im 20/41]
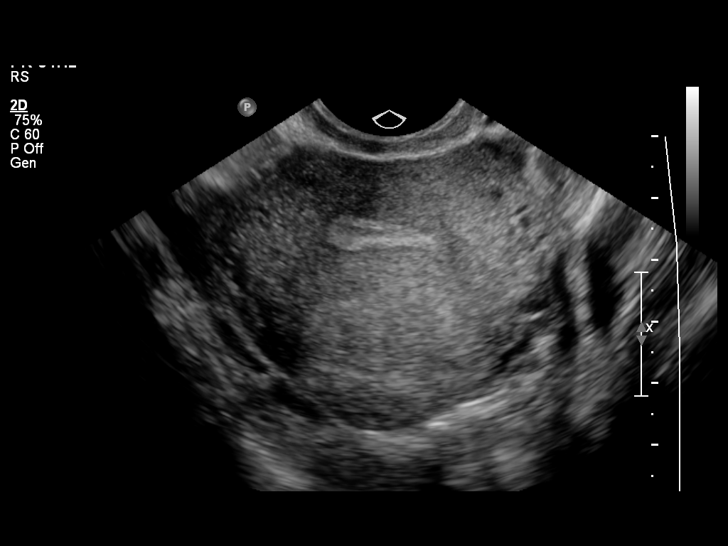
[im 23/41]
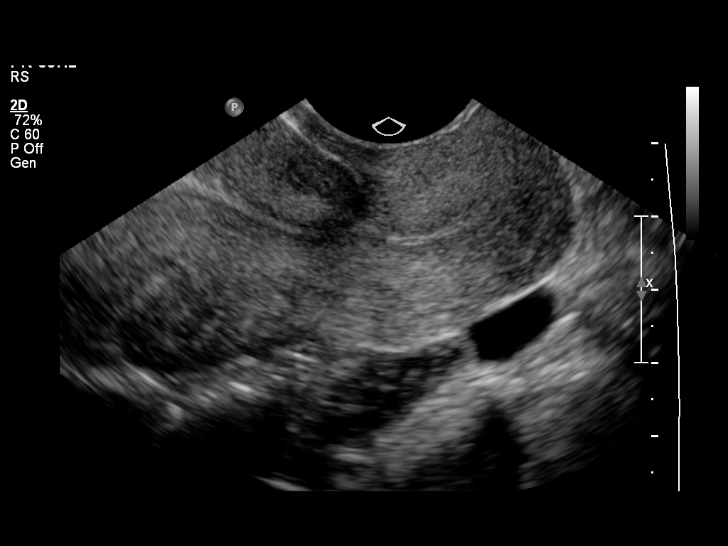
[im 26/41]
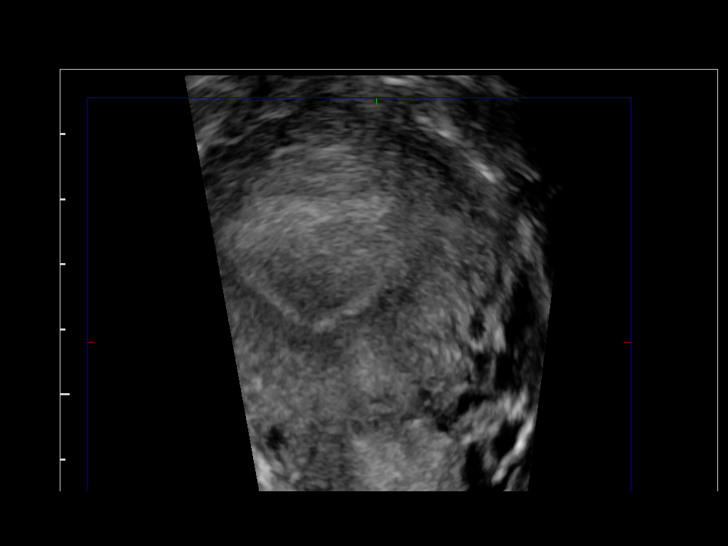
[im 29/41]
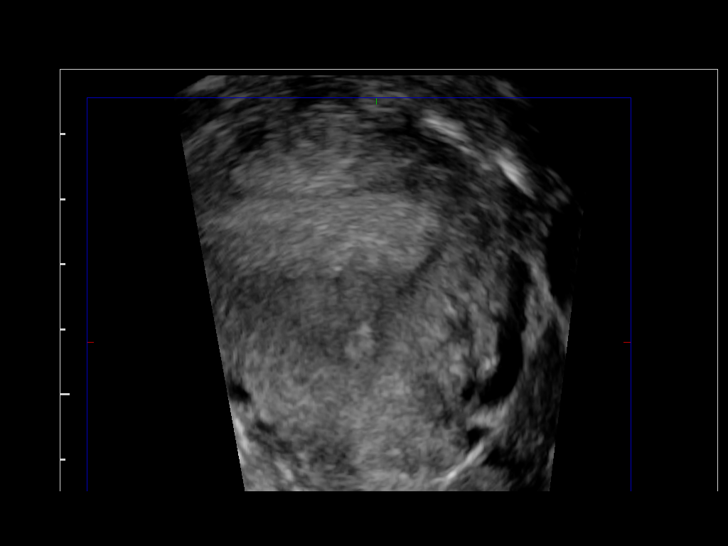
[im 32/41]
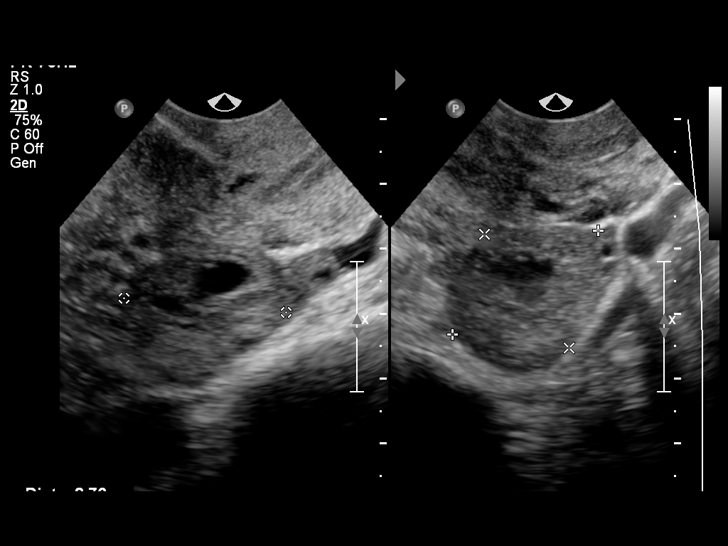
[im 35/41]
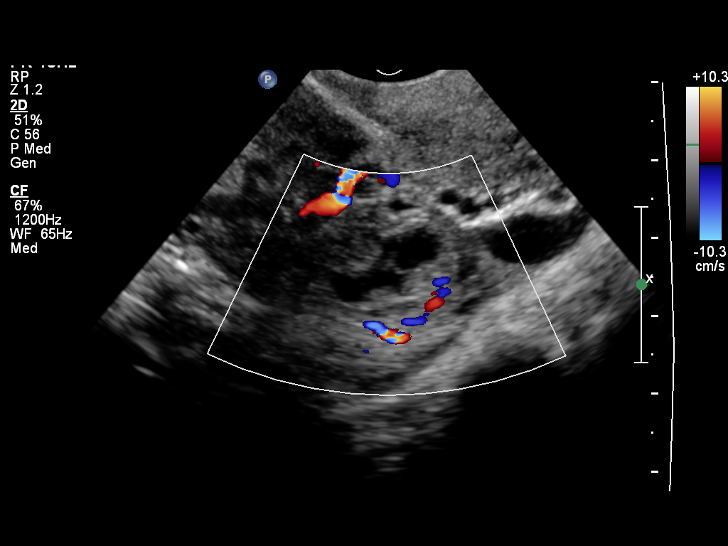
[im 38/41]
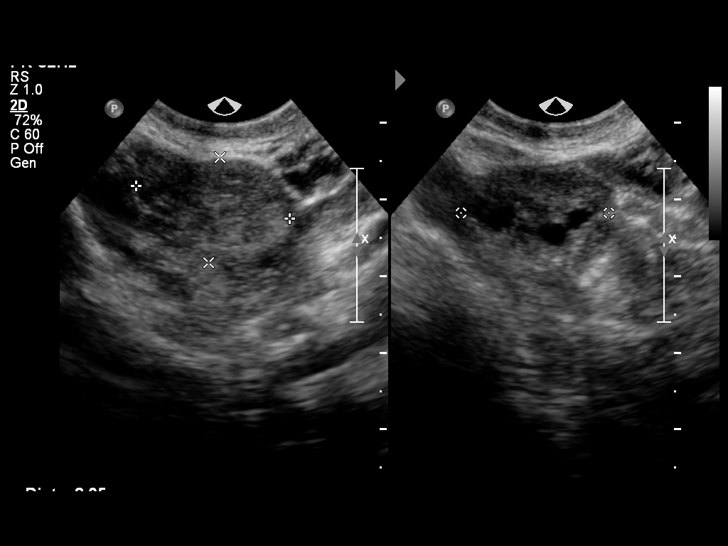
[im 41/41]
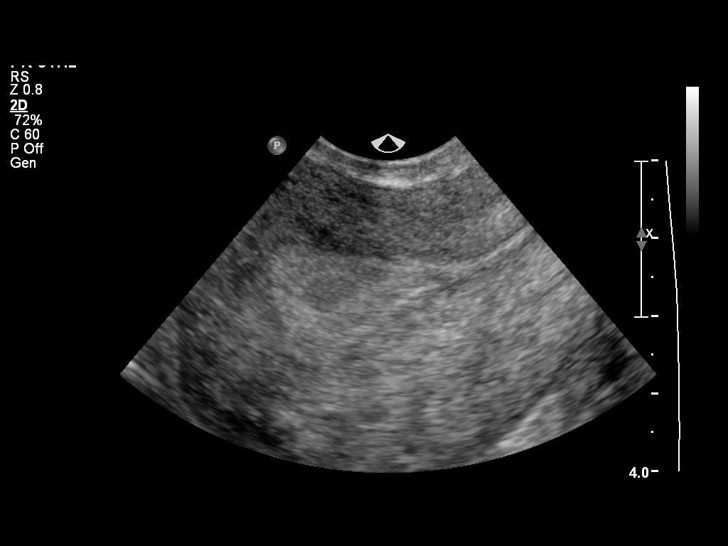

[14 of 28 positions shown; findings below may reference images not displayed]

Intrauterine gestational sac:  None
Yolk sac: None
Embryo: None
Cardiac Activity: None

Maternal uterus/adnexae:
No evidence for an intrauterine gestational sac. Left ovary
measures 2.8 x 2.2 x 2.5 cm.  2 cm cystic structure in the left
ovary could represent a corpus luteum cyst.  Right ovary measures
2.1 x 1.4 x 1.9 cm.  Small amount of free fluid.
IMPRESSION: No evidence for an intrauterine gestational sac.

## 2011-02-03 MED ORDER — BUTALBITAL-APAP-CAFFEINE 50-325-40 MG PO TABS
2.0000 | ORAL_TABLET | Freq: Once | ORAL | Status: AC
  Administered 2011-02-03: 2 via ORAL
  Filled 2011-02-03: qty 2

## 2011-02-03 NOTE — ED Provider Notes (Signed)
History    patient is a 25 year old black female who is a gravida 5 para 2 AB 2. She states that she has had several positive home pregnancy tests that were all faintly positive. She states for the past week that she has continued to have a severe headache as well as left lower quadrant pain. He states the pain has been constant. She also complains of a heavy vaginal discharge. She denies vaginal irritation or itching. She also denies vaginal bleeding. She has had nausea. She has no other complaints at this time.  Chief Complaint  Patient presents with  . Headache   HPI  OB History    Grav Para Term Preterm Abortions TAB SAB Ect Mult Living   5 2 2  2 2    2       No past medical history on file.  Past Surgical History  Procedure Date  . Cesarean section     No family history on file.  History  Substance Use Topics  . Smoking status: Not on file  . Smokeless tobacco: Not on file  . Alcohol Use: No    Allergies:  Allergies  Allergen Reactions  . Flagyl (Metronidazole Hcl) Hives and Nausea And Vomiting    Prescriptions prior to admission  Medication Sig Dispense Refill  . AZO-CRANBERRY PO Take 1 tablet by mouth daily as needed. Patient used this medication for UTI.       Marland Kitchen sulfamethoxazole-trimethoprim (BACTRIM DS,SEPTRA DS) 800-160 MG per tablet Take 1 tablet by mouth 2 (two) times daily.          Review of Systems  Constitutional: Positive for malaise/fatigue. Negative for fever and chills.  HENT: Negative for hearing loss, ear pain, nosebleeds and sore throat.   Eyes: Positive for photophobia. Negative for blurred vision and double vision.  Respiratory: Negative for cough, shortness of breath and wheezing.   Cardiovascular: Negative for chest pain and palpitations.  Gastrointestinal: Positive for nausea and abdominal pain. Negative for vomiting, diarrhea and constipation.  Genitourinary: Negative for dysuria, urgency, frequency, hematuria and flank pain.    Neurological: Positive for dizziness and headaches.  Psychiatric/Behavioral: Negative for depression and suicidal ideas.   Physical Exam   Blood pressure 112/64, pulse 91, temperature 98.8 F (37.1 C), temperature source Oral, resp. rate 16, height 5' 2.75" (1.594 m), weight 118 lb 6.4 oz (53.706 kg), last menstrual period 01/10/2011, SpO2 99.00%, unknown if currently breastfeeding.  Physical Exam  Constitutional: She is oriented to person, place, and time. She appears well-developed and well-nourished. No distress.  HENT:  Head: Normocephalic and atraumatic.  Eyes: EOM are normal. Pupils are equal, round, and reactive to light.  Neck: Normal range of motion. Neck supple.  Cardiovascular: Normal rate and regular rhythm.  Exam reveals no gallop and no friction rub.   No murmur heard. Respiratory: Effort normal and breath sounds normal. No respiratory distress. She has no wheezes. She has no rales. She exhibits no tenderness.  GI: Soft. She exhibits no distension and no mass. There is tenderness. There is no rebound and no guarding.  Genitourinary: No tenderness or bleeding around the vagina. Vaginal discharge found.       Bimanual examination demonstrates a uterus that is approximately 6 weeks size. Uterus is tender to palpation. No adnexal masses are noted. However she is tender in the left adnexa.  Neurological: She is alert and oriented to person, place, and time.  Skin: She is not diaphoretic.    MAU Course  Procedures  Results for orders placed during the hospital encounter of 02/03/11 (from the past 24 hour(s))  URINALYSIS, ROUTINE W REFLEX MICROSCOPIC     Status: Abnormal   Collection Time   02/03/11  5:45 PM      Component Value Range   Color, Urine YELLOW  YELLOW    Appearance CLEAR  CLEAR    Specific Gravity, Urine >1.030 (*) 1.005 - 1.030    pH 6.0  5.0 - 8.0    Glucose, UA NEGATIVE  NEGATIVE (mg/dL)   Hgb urine dipstick NEGATIVE  NEGATIVE    Bilirubin Urine NEGATIVE   NEGATIVE    Ketones, ur NEGATIVE  NEGATIVE (mg/dL)   Protein, ur NEGATIVE  NEGATIVE (mg/dL)   Urobilinogen, UA 0.2  0.0 - 1.0 (mg/dL)   Nitrite NEGATIVE  NEGATIVE    Leukocytes, UA NEGATIVE  NEGATIVE   POCT PREGNANCY, URINE     Status: Normal   Collection Time   02/03/11  6:13 PM      Component Value Range   Preg Test, Ur NEGATIVE    WET PREP, GENITAL     Status: Abnormal   Collection Time   02/03/11  7:03 PM      Component Value Range   Yeast, Wet Prep NONE SEEN  NONE SEEN    Trich, Wet Prep NONE SEEN  NONE SEEN    Clue Cells, Wet Prep NONE SEEN  NONE SEEN    WBC, Wet Prep HPF POC FEW (*) NONE SEEN   HCG, SERUM, QUALITATIVE     Status: Abnormal   Collection Time   02/03/11  7:15 PM      Component Value Range   Preg, Serum POSITIVE (*) NEGATIVE   CBC     Status: Normal   Collection Time   02/03/11  7:15 PM      Component Value Range   WBC 7.4  4.0 - 10.5 (K/uL)   RBC 4.33  3.87 - 5.11 (MIL/uL)   Hemoglobin 13.8  12.0 - 15.0 (g/dL)   HCT 16.1  09.6 - 04.5 (%)   MCV 93.1  78.0 - 100.0 (fL)   MCH 31.9  26.0 - 34.0 (pg)   MCHC 34.2  30.0 - 36.0 (g/dL)   RDW 40.9  81.1 - 91.4 (%)   Platelets 248  150 - 400 (K/uL)  DIFFERENTIAL     Status: Normal   Collection Time   02/03/11  7:15 PM      Component Value Range   Neutrophils Relative 50  43 - 77 (%)   Neutro Abs 3.7  1.7 - 7.7 (K/uL)   Lymphocytes Relative 41  12 - 46 (%)   Lymphs Abs 3.1  0.7 - 4.0 (K/uL)   Monocytes Relative 8  3 - 12 (%)   Monocytes Absolute 0.6  0.1 - 1.0 (K/uL)   Eosinophils Relative 1  0 - 5 (%)   Eosinophils Absolute 0.1  0.0 - 0.7 (K/uL)   Basophils Relative 1  0 - 1 (%)   Basophils Absolute 0.0  0.0 - 0.1 (K/uL)       Henrietta Hoover, PA 02/04/11 [phone]

## 2011-02-03 NOTE — Progress Notes (Signed)
Pt c/o left abdominal pain for a week, and headache feels dizzy and weak . N/V x1 day.  No vaginal bleeding, white non-odorous discharge.

## 2011-02-03 NOTE — Progress Notes (Signed)
Pt states that she did 3 HP's  that were lightly pink. Has been having headaches and L side cramping for about one week. No bleeding or d/c, One episode of vomiting, nauseated all the time, worse in the morning.

## 2011-02-04 LAB — GC/CHLAMYDIA PROBE AMP, GENITAL
Chlamydia, DNA Probe: NEGATIVE
GC Probe Amp, Genital: NEGATIVE

## 2011-02-05 ENCOUNTER — Inpatient Hospital Stay (HOSPITAL_COMMUNITY): Admit: 2011-02-05 | Discharge: 2011-02-05 | Payer: Self-pay

## 2011-02-05 ENCOUNTER — Inpatient Hospital Stay (HOSPITAL_COMMUNITY)
Admission: AD | Admit: 2011-02-05 | Discharge: 2011-02-05 | Disposition: A | Discharge status: Home / Self Care | Payer: Medicaid Other | Source: Ambulatory Visit | Attending: Obstetrics and Gynecology | Admitting: Obstetrics and Gynecology

## 2011-02-05 DIAGNOSIS — R1032 Left lower quadrant pain: Secondary | ICD-10-CM | POA: Insufficient documentation

## 2011-02-05 DIAGNOSIS — O9989 Other specified diseases and conditions complicating pregnancy, childbirth and the puerperium: Secondary | ICD-10-CM

## 2011-02-05 DIAGNOSIS — O26899 Other specified pregnancy related conditions, unspecified trimester: Secondary | ICD-10-CM

## 2011-02-05 DIAGNOSIS — O99891 Other specified diseases and conditions complicating pregnancy: Secondary | ICD-10-CM

## 2011-02-05 DIAGNOSIS — O3680X0 Pregnancy with inconclusive fetal viability, not applicable or unspecified: Secondary | ICD-10-CM

## 2011-02-05 DIAGNOSIS — R109 Unspecified abdominal pain: Secondary | ICD-10-CM

## 2011-02-05 LAB — HCG, QUANTITATIVE, PREGNANCY: hCG, Beta Chain, Quant, S: 34 m[IU]/mL — ABNORMAL HIGH (ref <5)

## 2011-02-05 NOTE — ED Provider Notes (Signed)
History     Chief Complaint  Patient presents with  . Abdominal Cramping   HPI Client returns today for follow up BHCG.  Was here on 02-03-11 and quant was 19.  Was having LLQ pain.  See ultrasound results from 02-03-11 listed below.  Is having the same cramping as she was having on 02-03-11.  No change.  Has last dose of medication to treat UTI to take today after she eats.  OB History    Grav Para Term Preterm Abortions TAB SAB Ect Mult Living   6 2 2  2 2    2       No past medical history on file.  Past Surgical History  Procedure Date  . Cesarean section     No family history on file.  History  Substance Use Topics  . Smoking status: Not on file  . Smokeless tobacco: Not on file  . Alcohol Use: No    Allergies:  Allergies  Allergen Reactions  . Flagyl (Metronidazole Hcl) Hives and Nausea And Vomiting    Prescriptions prior to admission  Medication Sig Dispense Refill  . AZO-CRANBERRY PO Take 1 tablet by mouth daily as needed. Patient used this medication for UTI.       Marland Kitchen sulfamethoxazole-trimethoprim (BACTRIM DS,SEPTRA DS) 800-160 MG per tablet Take 1 tablet by mouth 2 (two) times daily.          ROS Physical Exam   Blood pressure 114/63, pulse 77, temperature 98.5 F (36.9 C), temperature source Oral, resp. rate 18, last menstrual period 01/10/2011, unknown if currently breastfeeding.  Physical Exam In no distress.  MAU Course  Procedures Ultrasound done 02-03-11: *RADIOLOGY REPORT*  Clinical Data: Positive pregnancy test with left lower quadrant  pain.  OBSTETRIC <14 WK [place] AND TRANSVAGINAL OB US  Technique: Both transabdominal and transvaginal ultrasound  examinations were performed for complete evaluation of the  gestation as well as the maternal uterus, adnexal regions, and  pelvic cul-de-sac. Transvaginal technique was performed to assess  early pregnancy.  Comparison: 05/07/2010  Intrauterine gestational sac: None  Yolk sac: None  Embryo: None   Cardiac Activity: None  Maternal uterus/adnexae:  No evidence for an intrauterine gestational sac. Left ovary  measures 2.8 x 2.2 x 2.5 cm. 2 cm cystic structure in the left  ovary could represent a corpus luteum cyst. Right ovary measures  2.1 x 1.4 x 1.9 cm. Small amount of free fluid.  IMPRESSION:  No evidence for an intrauterine gestational sac.  Original Report Authenticated By: Richarda Overlie, M.D.  MDM  Quant today 64 Consulted with Dr. Jolayne Panther re: plan of care  Assessment and Plan  Pregnancy - rising quant  Plan: Will repeat quant on 02-07-11 Friday and ultrasound and quant in one week on 02-12-11 Wednesday. Ectopic precautions - no sex, nothing in the vagina. Return to MAU sooner with worsening abdominal pain or severe bleeding.   Dazha Kempa 02/05/2011, 10:16 AM

## 2011-02-05 NOTE — Progress Notes (Cosign Needed)
Still cramping no bleeding

## 2011-02-07 ENCOUNTER — Ambulatory Visit (HOSPITAL_COMMUNITY): Payer: Self-pay

## 2011-02-07 ENCOUNTER — Inpatient Hospital Stay (HOSPITAL_COMMUNITY)
Admission: AD | Admit: 2011-02-07 | Discharge: 2011-02-07 | Disposition: A | Discharge status: Home / Self Care | Payer: Medicaid Other | Source: Ambulatory Visit | Attending: Obstetrics & Gynecology | Admitting: Obstetrics & Gynecology

## 2011-02-07 DIAGNOSIS — R109 Unspecified abdominal pain: Secondary | ICD-10-CM | POA: Insufficient documentation

## 2011-02-07 DIAGNOSIS — O209 Hemorrhage in early pregnancy, unspecified: Secondary | ICD-10-CM | POA: Insufficient documentation

## 2011-02-07 LAB — HCG, QUANTITATIVE, PREGNANCY: hCG, Beta Chain, Quant, S: 64 m[IU]/mL — ABNORMAL HIGH (ref <5)

## 2011-02-07 NOTE — Progress Notes (Signed)
Pt to MAU for repeat BHCG. States continues to have a little cramping, no bleeding.

## 2011-02-07 NOTE — ED Provider Notes (Addendum)
History   Pt presents today for a f/u B-quant. She states she is doing well. She continues to have mild cramping but she states her bleeding has stopped. She denies severe pain, fever, or any other sx at this time.  Chief Complaint  Patient presents with  . Follow-up   HPI  OB History    Grav Para Term Preterm Abortions TAB SAB Ect Mult Living   6 2 2  2 2    2       No past medical history on file.  Past Surgical History  Procedure Date  . Cesarean section     No family history on file.  History  Substance Use Topics  . Smoking status: Not on file  . Smokeless tobacco: Not on file  . Alcohol Use: No    Allergies:  Allergies  Allergen Reactions  . Flagyl (Metronidazole Hcl) Hives and Nausea And Vomiting    Prescriptions prior to admission  Medication Sig Dispense Refill  . sulfamethoxazole-trimethoprim (BACTRIM DS,SEPTRA DS) 800-160 MG per tablet Take 1 tablet by mouth 2 (two) times daily.          Review of Systems  Constitutional: Negative for fever and chills.  Cardiovascular: Negative for chest pain.  Gastrointestinal: Positive for abdominal pain. Negative for nausea, vomiting, diarrhea and constipation.  Genitourinary: Negative for dysuria, urgency and frequency.  Neurological: Negative for dizziness and headaches.  Psychiatric/Behavioral: Negative for depression and suicidal ideas.   Physical Exam   Blood pressure 119/76, pulse 72, temperature 98 F (36.7 C), temperature source Oral, resp. rate 16, last menstrual period 01/10/2011, unknown if currently breastfeeding.  Physical Exam  Constitutional: She is oriented to person, place, and time. She appears well-developed and well-nourished. No distress.  HENT:  Head: Normocephalic and atraumatic.  GI: She exhibits no distension. There is no tenderness. There is no rebound and no guarding.  Neurological: She is alert and oriented to person, place, and time.  Skin: Skin is warm and dry. She is not  diaphoretic.  Psychiatric: She has a normal mood and affect. Her behavior is normal. Judgment and thought content normal.    MAU Course  Procedures  Results for orders placed during the hospital encounter of 02/07/11 (from the past 48 hour(s))  HCG, QUANTITATIVE, PREGNANCY     Status: Abnormal   Collection Time   02/07/11  9:33 AM      Component Value Range Comment   hCG, Beta Chain, Quant, S 64 (*) <5 (mIU/mL)      Assessment and Plan  Pregnancy: pt has appropriate rise in her B-quant. She already has another appt scheduled for a repeat B-quant and repeat US next wk. Discussed diet, activity, risks, and precautions. Discussed ectopic precautions. She understood and agreed.  Clinton Gallant. Nicolis Boody III, DrHSc, MPAS, PA-C  02/07/2011, 10:26 AM   Henrietta Hoover, PA 02/14/11 [phone]

## 2011-02-12 ENCOUNTER — Other Ambulatory Visit (HOSPITAL_COMMUNITY): Payer: Self-pay | Admitting: Obstetrics and Gynecology

## 2011-02-12 ENCOUNTER — Inpatient Hospital Stay (HOSPITAL_COMMUNITY)
Admission: AD | Admit: 2011-02-12 | Discharge: 2011-02-12 | Disposition: A | Discharge status: Home / Self Care | Payer: Medicaid Other | Source: Ambulatory Visit | Attending: Obstetrics and Gynecology | Admitting: Obstetrics and Gynecology

## 2011-02-12 ENCOUNTER — Ambulatory Visit (HOSPITAL_COMMUNITY)
Admit: 2011-02-12 | Discharge: 2011-02-12 | Disposition: A | Discharge status: Home / Self Care | Payer: Medicaid Other | Attending: Obstetrics and Gynecology | Admitting: Obstetrics and Gynecology

## 2011-02-12 DIAGNOSIS — O3680X0 Pregnancy with inconclusive fetal viability, not applicable or unspecified: Secondary | ICD-10-CM

## 2011-02-12 DIAGNOSIS — R1032 Left lower quadrant pain: Secondary | ICD-10-CM | POA: Insufficient documentation

## 2011-02-12 DIAGNOSIS — Z3689 Encounter for other specified antenatal screening: Secondary | ICD-10-CM | POA: Insufficient documentation

## 2011-02-12 DIAGNOSIS — Z3201 Encounter for pregnancy test, result positive: Secondary | ICD-10-CM

## 2011-02-12 DIAGNOSIS — O368390 Maternal care for abnormalities of the fetal heart rate or rhythm, unspecified trimester, not applicable or unspecified: Secondary | ICD-10-CM | POA: Insufficient documentation

## 2011-02-12 LAB — HCG, QUANTITATIVE, PREGNANCY: hCG, Beta Chain, Quant, S: 162 m[IU]/mL — ABNORMAL HIGH (ref <5)

## 2011-02-12 IMAGING — US US OB TRANSVAGINAL
1 series · 14 of 23 positions shown · non-contrast
Comparison: none

[Series 1: us ob transvaginal · 14 of 23 slices shown]
[im 1/23]
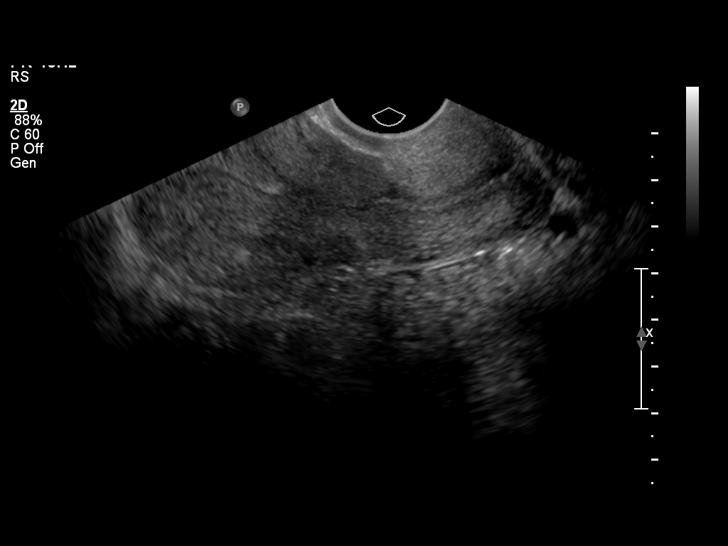
[im 3/23]
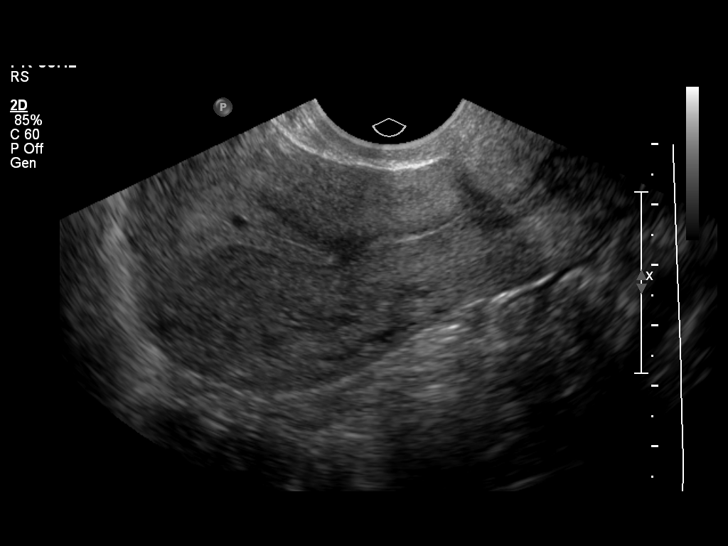
[im 5/23]
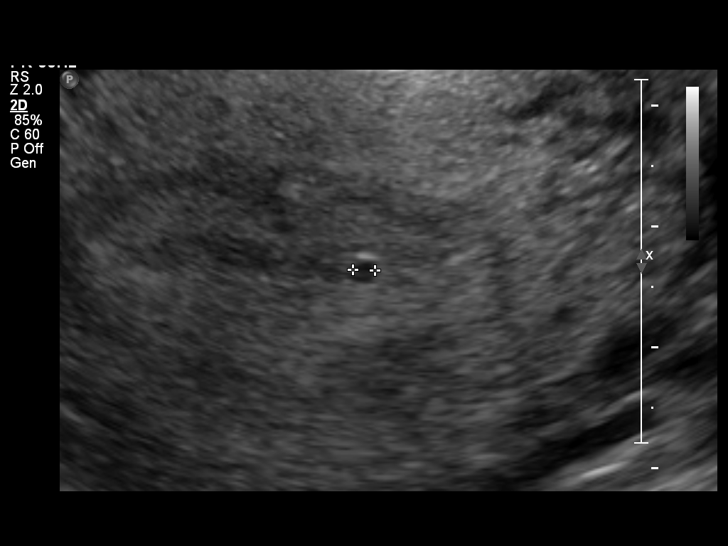
[im 6/23]
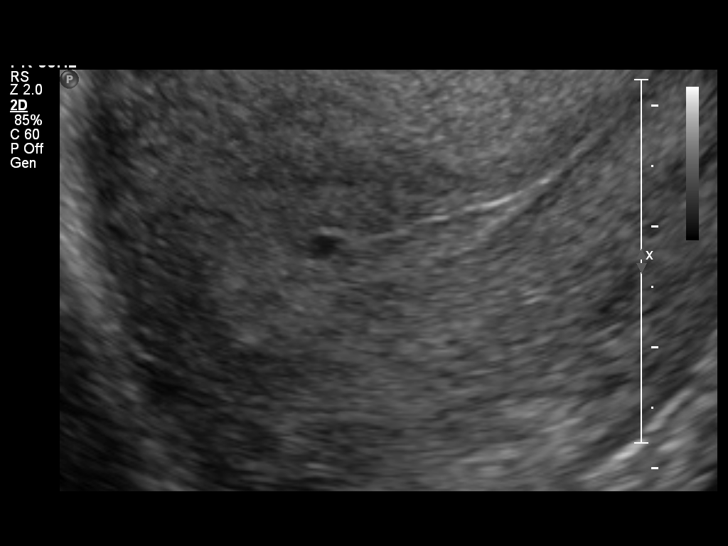
[im 8/23]
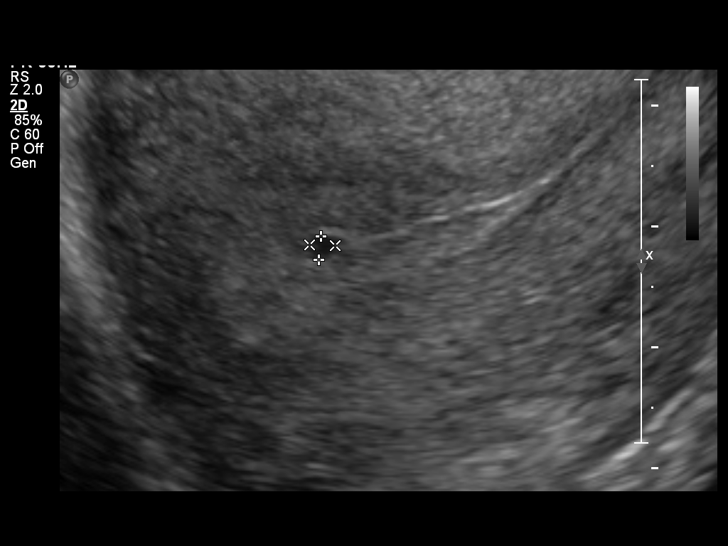
[im 10/23]
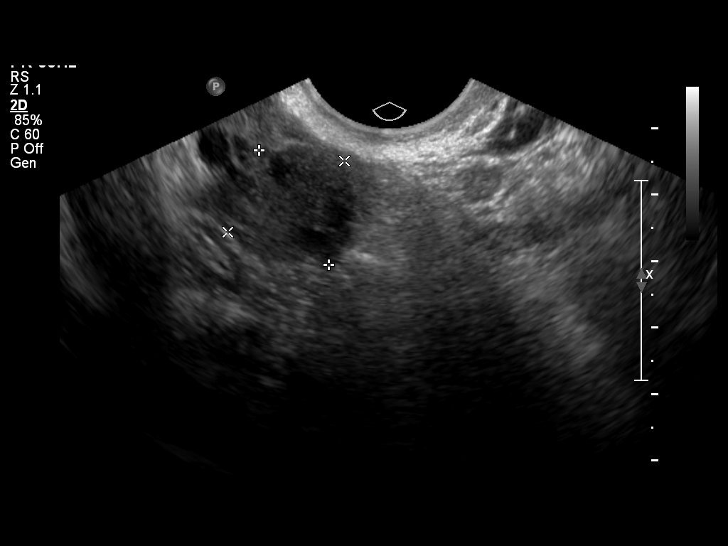
[im 11/23]
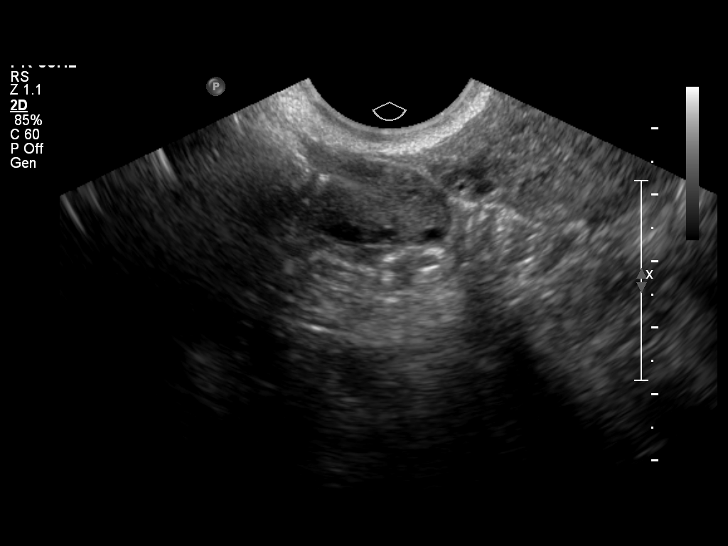
[im 13/23]
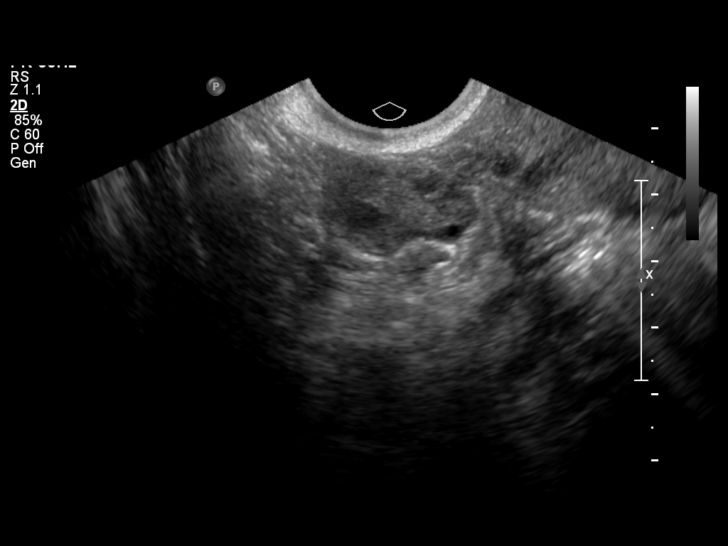
[im 14/23]
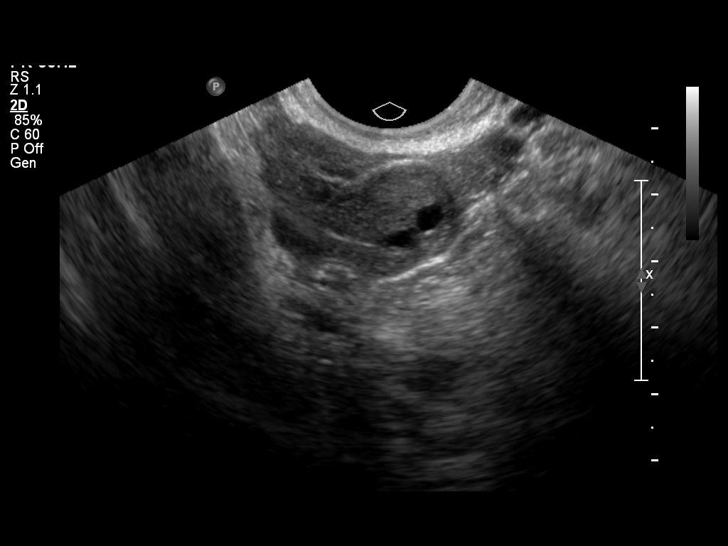
[im 16/23]
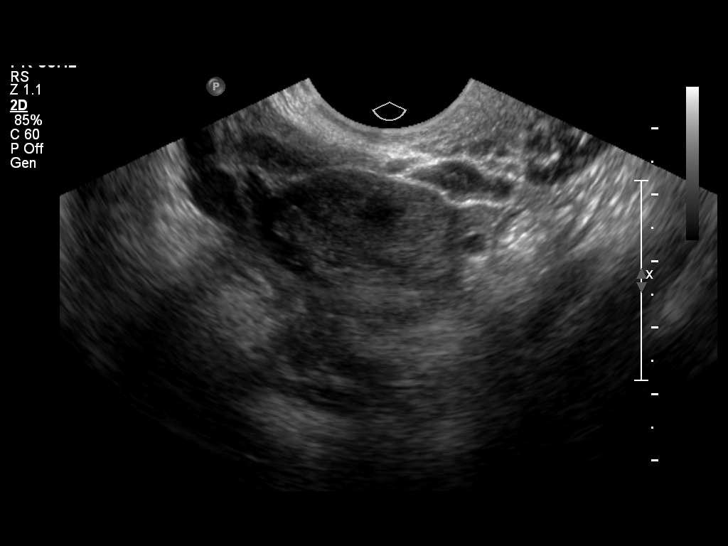
[im 18/23]
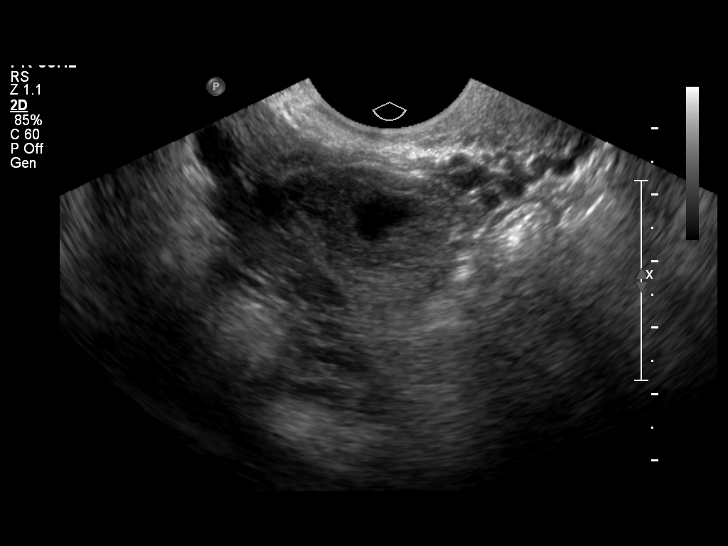
[im 19/23]
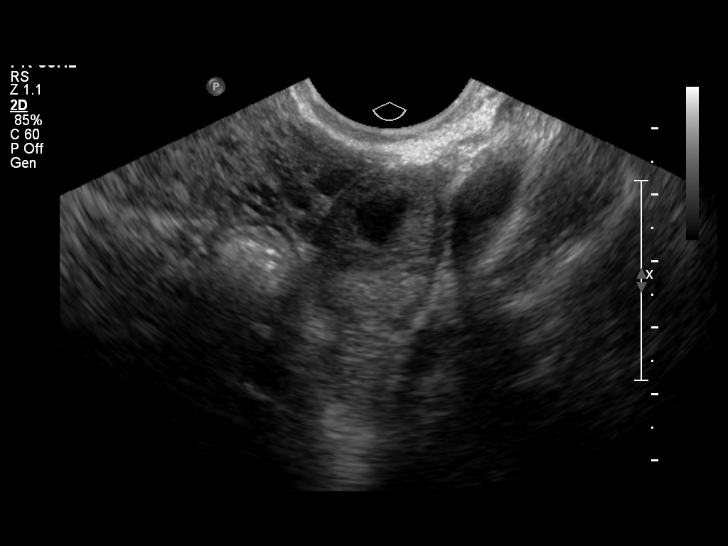
[im 21/23]
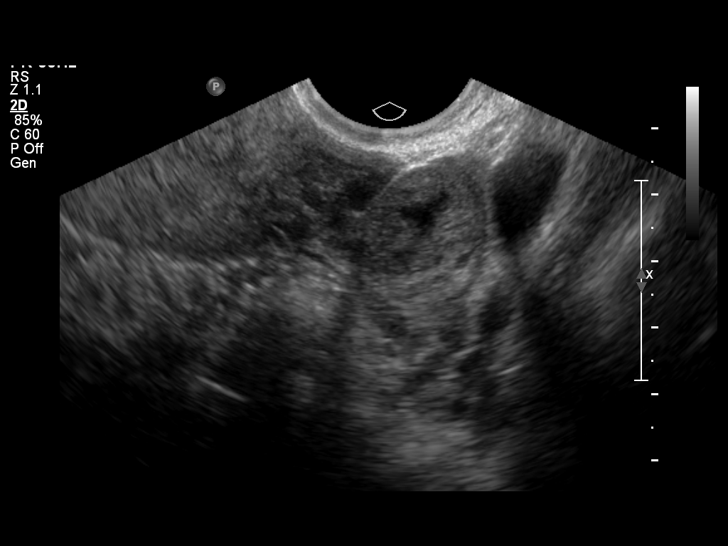
[im 23/23]
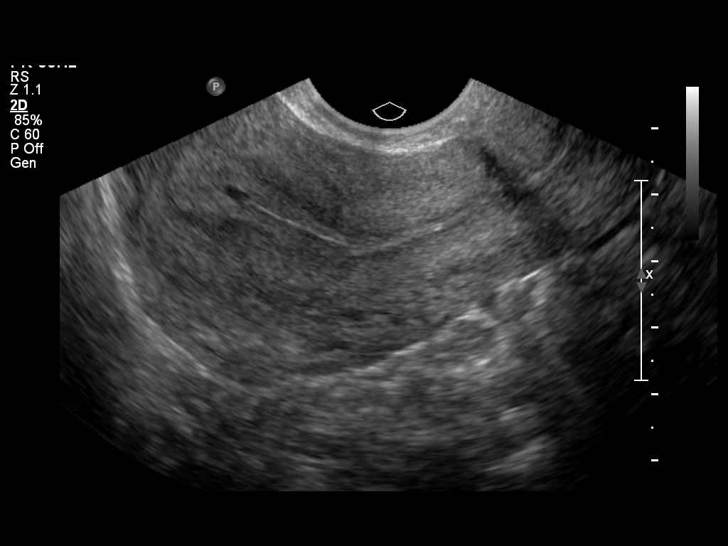

[14 of 23 positions shown; findings below may reference images not displayed]

OBSTETRICS REPORT
                      (Signed Final [DATE] [DATE])

 Order#:         [PHONE_NUMBER]_O
Procedures

 US OB TRANSVAGINAL                                    76817.0
Indications

 No fetal cardiac activity detected;  assess viability
Fetal Evaluation

 Preg. Location:    Intrauterine
 Gest. Sac:         Intrauterine
 Yolk Sac:          Not visualized
 Fetal Pole:        Not visualized
 Cardiac Activity:  No embryo visualized
Biometry

 GS:       1.9  mm     G. Age:  N/A                    EDD:
Gestational Age

 LMP:           4w 5d         Date:  [DATE]                 EDD:   [DATE]
 Best:          4w 5d      Det. By:  LMP  ([DATE])          EDD:   [DATE]
Cervix Uterus Adnexa

 Cervix:       Closed.

 Left Ovary:    Within normal limits. Small corpus luteum noted.
 Right Ovary:   Within normal limits.
 Adnexa:     No abnormality visualized.
Impression

 Probable early 5wk IUGS seen on today's exam.
 No adnexal mass or free fluid identified.
Recommendations

 Close follow-up of quantitative B HCG levels. Follow-up US in
 10 days to assess pregnancy progression/viability.
 questions or concerns.

## 2011-02-12 NOTE — ED Provider Notes (Addendum)
History   Pt presents today for repeat B-quant and [place]. She states she is doing well and has no complaints. She denies vag bleeding and is only having mild abd cramping.  No chief complaint on file.  HPI  OB History    Grav Para Term Preterm Abortions TAB SAB Ect Mult Living   6 2 2  2 2    2       No past medical history on file.  Past Surgical History  Procedure Date  . Cesarean section     No family history on file.  History  Substance Use Topics  . Smoking status: Not on file  . Smokeless tobacco: Not on file  . Alcohol Use: No    Allergies:  Allergies  Allergen Reactions  . Flagyl (Metronidazole Hcl) Hives and Nausea And Vomiting    Prescriptions prior to admission  Medication Sig Dispense Refill  . sulfamethoxazole-trimethoprim (BACTRIM DS,SEPTRA DS) 800-160 MG per tablet Take 1 tablet by mouth 2 (two) times daily.          Review of Systems  Constitutional: Negative for fever and chills.  Cardiovascular: Negative for chest pain.  Gastrointestinal: Negative for nausea, vomiting, abdominal pain and diarrhea.  Genitourinary: Negative for dysuria, urgency, frequency, hematuria and flank pain.  Neurological: Negative for dizziness and headaches.  Psychiatric/Behavioral: Negative for depression and suicidal ideas.   Physical Exam   Last menstrual period 01/10/2011, unknown if currently breastfeeding.  Physical Exam  Constitutional: She is oriented to person, place, and time. She appears well-developed and well-nourished. No distress.  HENT:  Head: Normocephalic and atraumatic.  GI: She exhibits no distension. There is no tenderness. There is no rebound and no guarding.  Neurological: She is alert and oriented to person, place, and time.  Skin: Skin is warm and dry. She is not diaphoretic.  Psychiatric: She has a normal mood and affect. Her behavior is normal. Judgment and thought content normal.    MAU Course  Procedures  Results for orders placed  during the hospital encounter of 02/12/11 (from the past 24 hour(s))  HCG, QUANTITATIVE, PREGNANCY     Status: Abnormal   Collection Time   02/12/11  8:25 AM      Component Value Range   hCG, Beta Chain, Quant, S 162 (*) <5 (mIU/mL)   US Ob Comp Less 14 Wks  02/03/2011  *RADIOLOGY REPORT*  Clinical Data: Positive pregnancy test with left lower quadrant pain.  OBSTETRIC <14 WK [place] AND TRANSVAGINAL OB US  Technique:  Both transabdominal and transvaginal ultrasound examinations were performed for complete evaluation of the gestation as well as the maternal uterus, adnexal regions, and pelvic cul-de-sac.  Transvaginal technique was performed to assess early pregnancy.  Comparison:  05/07/2010  Intrauterine gestational sac:  None Yolk sac: None Embryo: None Cardiac Activity: None  Maternal uterus/adnexae: No evidence for an intrauterine gestational sac. Left ovary measures 2.8 x 2.2 x 2.5 cm.  2 cm cystic structure in the left ovary could represent a corpus luteum cyst.  Right ovary measures 2.1 x 1.4 x 1.9 cm.  Small amount of free fluid.  IMPRESSION: No evidence for an intrauterine gestational sac.  Original Report Authenticated By: Richarda Overlie, M.D.   US Ob Transvaginal  02/12/2011  OBSTETRICAL ULTRASOUND: This exam was performed within a [facility]. The OB US report was generated in the [facility] system, and faxed to the ordering physician.   This report is also available in [employer] and  in the [employer]. See [facility] Obstetric US report.   US Ob Transvaginal  02/03/2011  *RADIOLOGY REPORT*  Clinical Data: Positive pregnancy test with left lower quadrant pain.  OBSTETRIC <14 WK [place] AND TRANSVAGINAL OB US  Technique:  Both transabdominal and transvaginal ultrasound examinations were performed for complete evaluation of the gestation as well as the maternal uterus, adnexal regions, and pelvic cul-de-sac.  Transvaginal technique was performed to assess early pregnancy.   Comparison:  05/07/2010  Intrauterine gestational sac:  None Yolk sac: None Embryo: None Cardiac Activity: None  Maternal uterus/adnexae: No evidence for an intrauterine gestational sac. Left ovary measures 2.8 x 2.2 x 2.5 cm.  2 cm cystic structure in the left ovary could represent a corpus luteum cyst.  Right ovary measures 2.1 x 1.4 x 1.9 cm.  Small amount of free fluid.  IMPRESSION: No evidence for an intrauterine gestational sac.  Original Report Authenticated By: Richarda Overlie, M.D.   02/12/11 US demonstrates probable early gestational sac @ 4.5wks. No adnexal masses noted. Assessment and Plan  1) Pregnancy: will have pt return in 10 days for repeat B-quant and [place]. Discussed diet, activity, risks, and precautions. Discussed signs and sx of ectopic pregnancy.  Clinton Gallant. Gresham Caetano III, DrHSc, MPAS, PA-C  02/12/2011, 9:08 AM

## 2011-02-12 NOTE — Progress Notes (Signed)
Pt to MAU for f/u BHCG and ultrasound. Pt denies any pain or bleeding.

## 2011-02-14 ENCOUNTER — Inpatient Hospital Stay (HOSPITAL_COMMUNITY): Payer: Medicaid Other

## 2011-02-14 ENCOUNTER — Inpatient Hospital Stay (HOSPITAL_COMMUNITY)
Admission: AD | Admit: 2011-02-14 | Discharge: 2011-02-14 | Disposition: A | Discharge status: Home / Self Care | Payer: Medicaid Other | Source: Ambulatory Visit | Attending: Obstetrics & Gynecology | Admitting: Obstetrics & Gynecology

## 2011-02-14 ENCOUNTER — Encounter (HOSPITAL_COMMUNITY): Payer: Self-pay

## 2011-02-14 DIAGNOSIS — R109 Unspecified abdominal pain: Secondary | ICD-10-CM | POA: Insufficient documentation

## 2011-02-14 DIAGNOSIS — O26899 Other specified pregnancy related conditions, unspecified trimester: Secondary | ICD-10-CM

## 2011-02-14 DIAGNOSIS — O9989 Other specified diseases and conditions complicating pregnancy, childbirth and the puerperium: Secondary | ICD-10-CM | POA: Insufficient documentation

## 2011-02-14 HISTORY — DX: Unspecified abnormal cytological findings in specimens from cervix uteri: R87.619

## 2011-02-14 HISTORY — DX: Reserved for concepts with insufficient information to code with codable children: IMO0002

## 2011-02-14 LAB — URINALYSIS, ROUTINE W REFLEX MICROSCOPIC
Bilirubin Urine: NEGATIVE
Glucose, UA: NEGATIVE mg/dL
Hgb urine dipstick: NEGATIVE
Ketones, ur: NEGATIVE mg/dL
Leukocytes, UA: NEGATIVE
Nitrite: NEGATIVE
Protein, ur: NEGATIVE mg/dL
Specific Gravity, Urine: 1.025 (ref 1.005–1.030)
Urobilinogen, UA: 0.2 mg/dL (ref 0.0–1.0)
pH: 6 (ref 5.0–8.0)

## 2011-02-14 LAB — CBC
HCT: 37.6 % (ref 36.0–46.0)
Hemoglobin: 12.7 g/dL (ref 12.0–15.0)
MCH: 31.7 pg (ref 26.0–34.0)
MCHC: 33.8 g/dL (ref 30.0–36.0)
MCV: 93.8 fL (ref 78.0–100.0)
Platelets: 290 10*3/uL (ref 150–400)
RBC: 4.01 MIL/uL (ref 3.87–5.11)
RDW: 12.1 % (ref 11.5–15.5)
WBC: 5.8 10*3/uL (ref 4.0–10.5)

## 2011-02-14 LAB — HCG, QUANTITATIVE, PREGNANCY: hCG, Beta Chain, Quant, S: 177 m[IU]/mL — ABNORMAL HIGH (ref <5)

## 2011-02-14 IMAGING — US US OB TRANSVAGINAL
1 series · 13 of 28 positions shown · non-contrast
Comparison: none

[Series 1: us ob transvaginal · 13 of 34 slices shown]
[im 2/34]
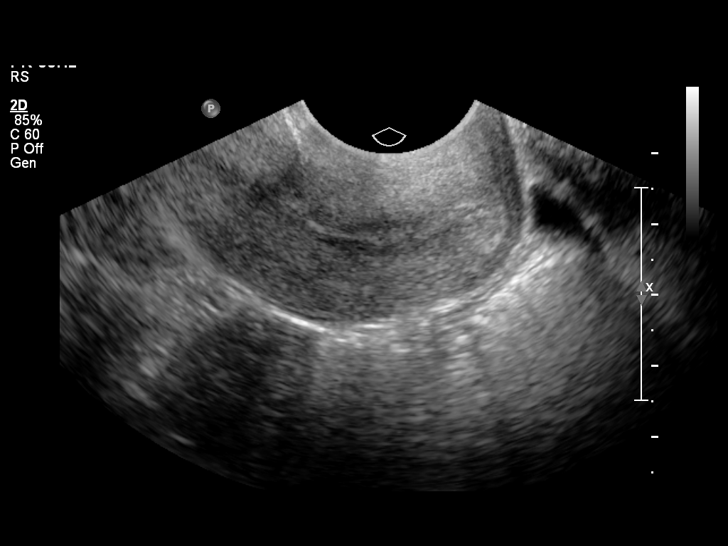
[im 4/34]
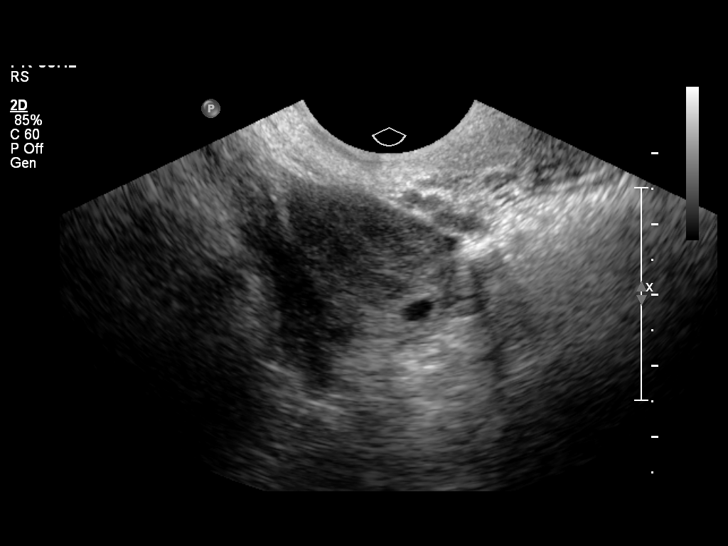
[im 7/34]
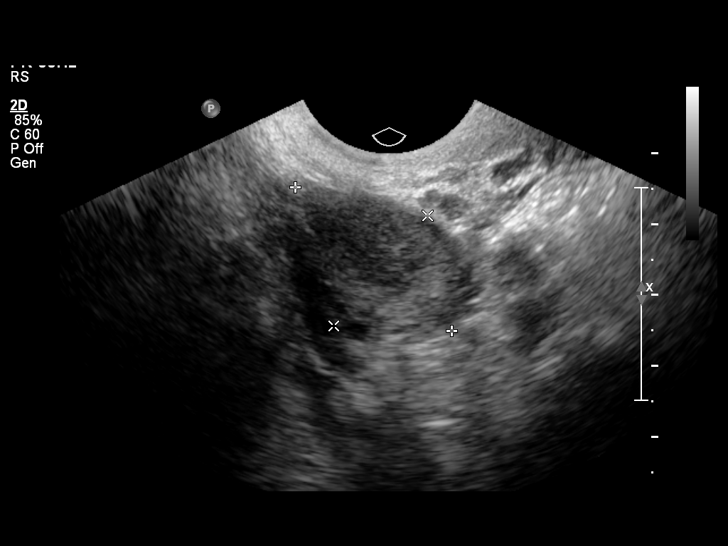
[im 9/34]
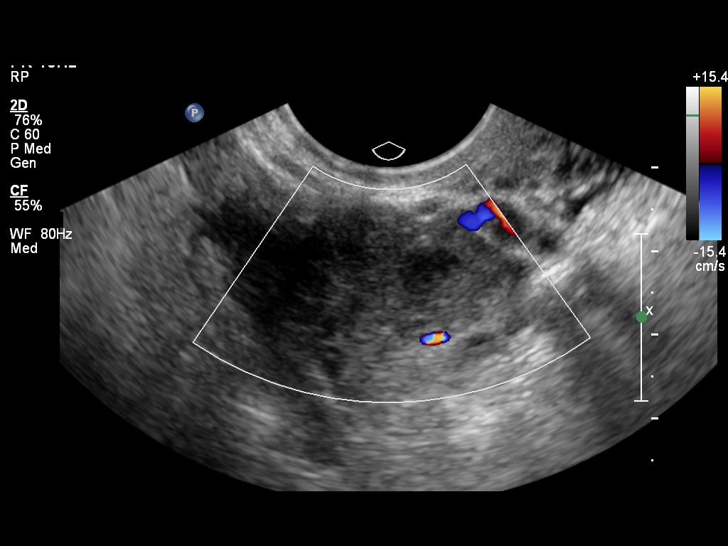
[im 12/34]
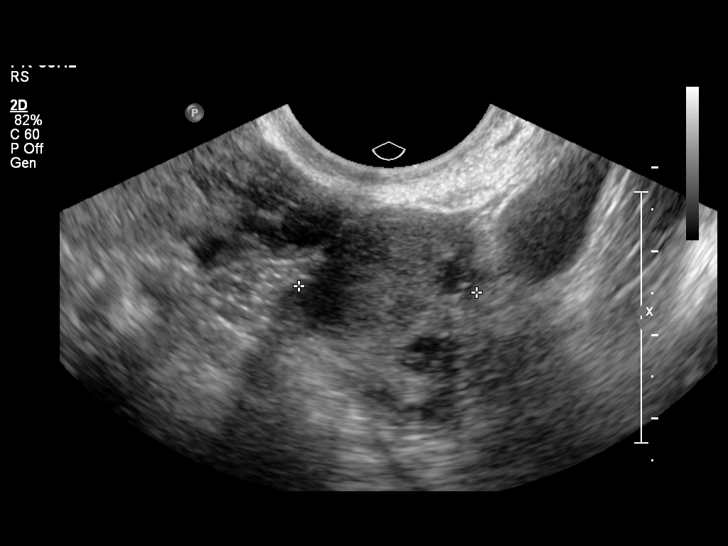
[im 14/34]
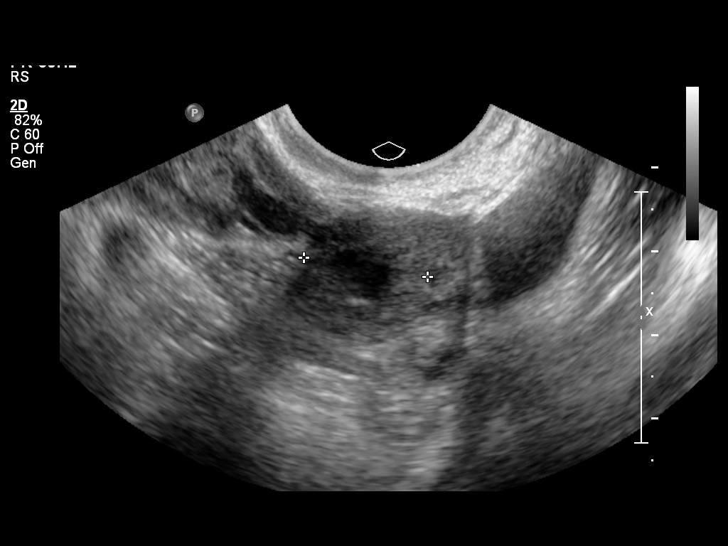
[im 18/34]
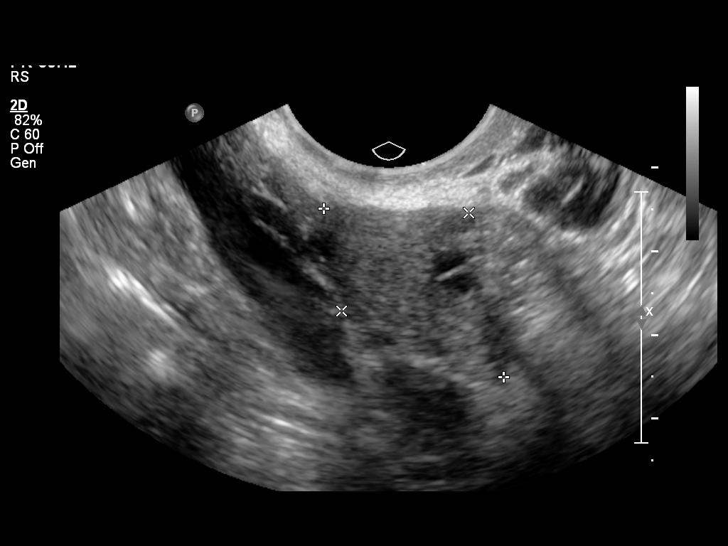
[im 20/34]
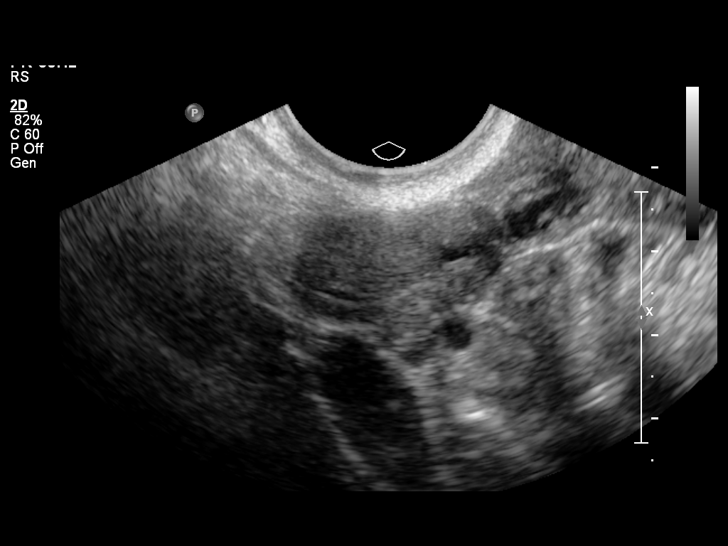
[im 23/34]
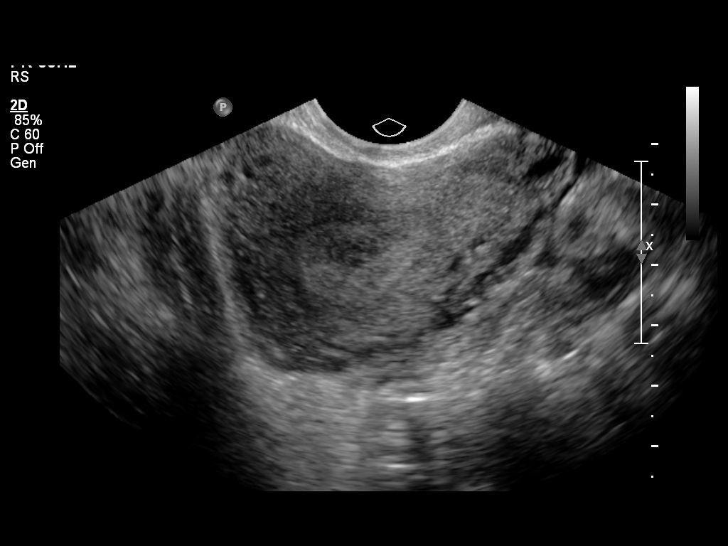
[im 25/34]
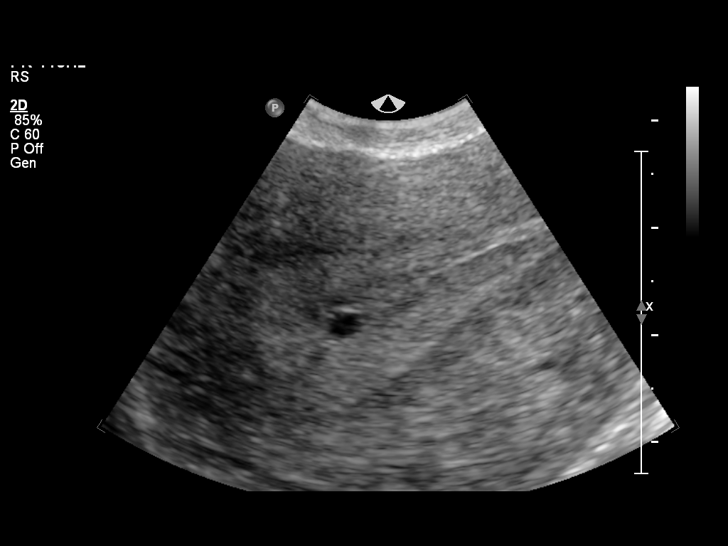
[im 27/34]
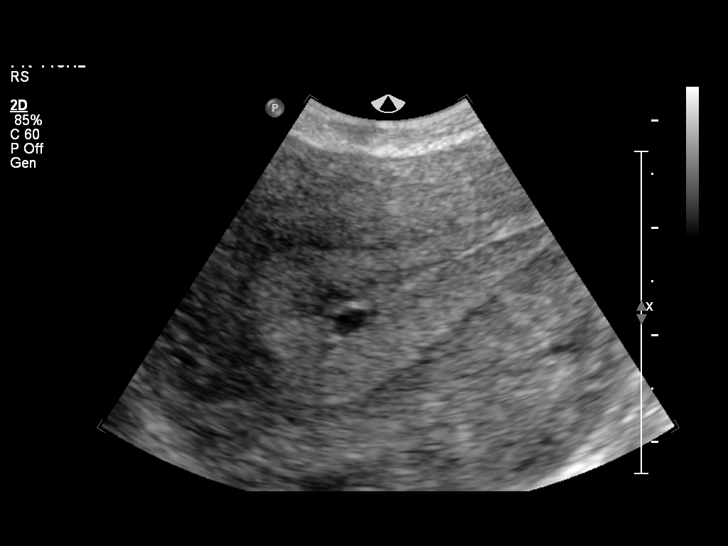
[im 30/34]
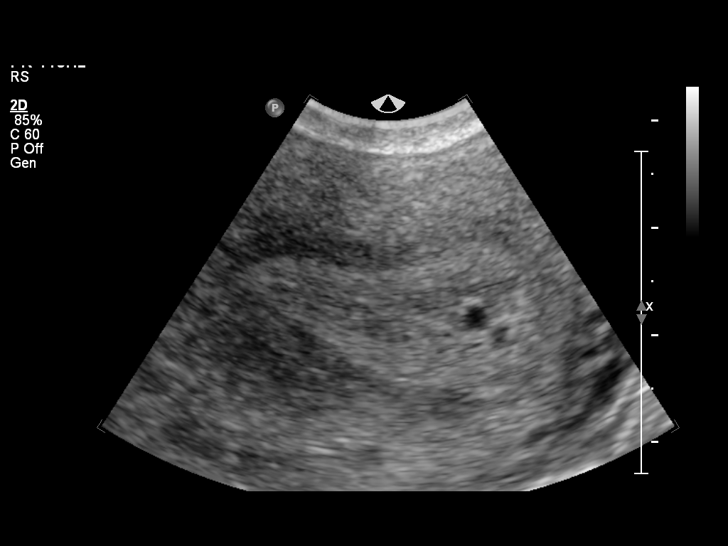
[im 32/34]
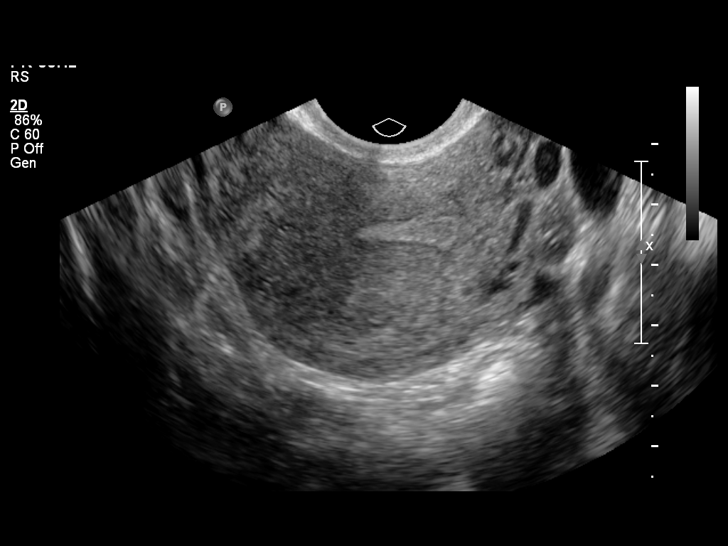

[13 of 28 positions shown; findings below may reference images not displayed]

OBSTETRICS REPORT
                      (Signed Final [DATE] [DATE])

Procedures

 US OB TRANSVAGINAL                                    76817.0
Indications

 Pain - Abdominal/Pelvic
 Size less than dates (Small for gestational [AGE]
 FGR)
 Bhcg NOT RISING
Fetal Evaluation

 Preg. Location:    Intrauterine ?
 Gest. Sac:         ? early IUGS vs.
                    pseudosac
 Yolk Sac:          Not visualized
 Fetal Pole:        Not visualized
 Cardiac Activity:  No embryo visualized
Biometry

 GS:         3  mm     G. Age:  4w 4d                  EDD:    [DATE]
Gestational Age

 LMP:           5w 0d         Date:  [DATE]                 EDD:   [DATE]
 Best:          5w 0d      Det. By:  LMP  ([DATE])          EDD:   [DATE]
Cervix Uterus Adnexa

 Left Ovary:    Size(cm) L: 3 x W: 2.13 x H: 2.04  Volume(cc): 6.8.
                Small corpus luteum noted.
 Right Ovary:   Size(cm) L: 2.95 x W: 2.38 x H: 1.92  Volume(cc):

 Comment:    Small amount of simple free fluid in cul de sac.
Impression

 Single intrauterine gestational sac demonstrating an
 intradecidual sign and appropriate growth since recent prior
 exam. No YS or fetal pole is seen, but not expected at today's
 MSD of 3mm. Given normal growth rates, we would typically
 expect to see a yolk sac in 10 days and fetal pole in 17 days.
 Continued correlation with serial bHCG with follow up
 ultrasound as indicated is recommended.

 Normal ovaries with no complex pelvic fluid or adnexal
 masses seen to suggest concurrent ectopic gestation
 sonographically today.

 questions or concerns.

## 2011-02-14 NOTE — Progress Notes (Signed)
Pain really bad last night, tylenol knocked off the edge.

## 2011-02-14 NOTE — ED Provider Notes (Signed)
History   Pt presents today c/o lower abd pain that worsened following intercourse yesterday. She denies vag dc or bleeding. She denies fever. She is currently being followed for abnormally rising B-quants.  Chief Complaint  Patient presents with  . Abdominal Cramping   HPI  OB History    Grav Para Term Preterm Abortions TAB SAB Ect Mult Living   5 2 2  2 2    2       Past Medical History  Diagnosis Date  . Abnormal Pap smear     repeat WNL    Past Surgical History  Procedure Date  . Cesarean section   . Wisdom tooth extraction 2004    Family History  Problem Relation Age of Onset  . Hypertension Maternal Grandmother   . Stroke Maternal Grandmother   . Cancer Paternal Grandmother     brain, lung  . Diabetes Paternal Grandmother   . Heart disease Paternal Grandmother   . Hypertension Paternal Grandmother     History  Substance Use Topics  . Smoking status: Never Smoker   . Smokeless tobacco: Never Used  . Alcohol Use: No    Allergies:  Allergies  Allergen Reactions  . Flagyl (Metronidazole Hcl) Hives and Nausea And Vomiting    Prescriptions prior to admission  Medication Sig Dispense Refill  . acetaminophen (TYLENOL) 500 MG tablet Take 500 mg by mouth 3 (three) times daily as needed. For pain       . prenatal vitamin w/FE, FA (PRENATAL 1 + 1) 27-1 MG TABS Take 1 tablet by mouth daily.        Marland Kitchen sulfamethoxazole-trimethoprim (BACTRIM DS,SEPTRA DS) 800-160 MG per tablet Take 1 tablet by mouth 2 (two) times daily. Pt finished this med 2 weeks ago (01/31/11)        Review of Systems  Constitutional: Negative for fever.  Gastrointestinal: Positive for abdominal pain. Negative for nausea, vomiting, diarrhea and constipation.  Genitourinary: Negative for dysuria, urgency, frequency, hematuria and flank pain.  Neurological: Negative for dizziness and headaches.  Psychiatric/Behavioral: Negative for depression and suicidal ideas.   Physical Exam   Blood pressure  108/73, pulse 90, temperature 98.6 F (37 C), temperature source Oral, resp. rate 20, height 5' 2.5" (1.588 m), weight 119 lb 9.6 oz (54.25 kg), last menstrual period 01/10/2011, unknown if currently breastfeeding.  Physical Exam  Constitutional: She is oriented to person, place, and time. She appears well-developed and well-nourished. No distress.  HENT:  Head: Normocephalic and atraumatic.  Eyes: EOM are normal. Pupils are equal, round, and reactive to light.  GI: Soft. She exhibits no distension and no mass. There is tenderness. There is no rebound and no guarding.  Neurological: She is alert and oriented to person, place, and time.  Skin: Skin is warm and dry. She is not diaphoretic.  Psychiatric: She has a normal mood and affect. Her behavior is normal. Judgment and thought content normal.    MAU Course  Procedures  Results for orders placed during the hospital encounter of 02/14/11 (from the past 24 hour(s))  HCG, QUANTITATIVE, PREGNANCY     Status: Abnormal   Collection Time   02/14/11  9:22 AM      Component Value Range   hCG, Beta Chain, Quant, S 177 (*) <5 (mIU/mL)  URINALYSIS, ROUTINE W REFLEX MICROSCOPIC     Status: Normal   Collection Time   02/14/11  9:50 AM      Component Value Range   Color, Urine YELLOW  YELLOW    Appearance CLEAR  CLEAR    Specific Gravity, Urine 1.025  1.005 - 1.030    pH 6.0  5.0 - 8.0    Glucose, UA NEGATIVE  NEGATIVE (mg/dL)   Hgb urine dipstick NEGATIVE  NEGATIVE    Bilirubin Urine NEGATIVE  NEGATIVE    Ketones, ur NEGATIVE  NEGATIVE (mg/dL)   Protein, ur NEGATIVE  NEGATIVE (mg/dL)   Urobilinogen, UA 0.2  0.0 - 1.0 (mg/dL)   Nitrite NEGATIVE  NEGATIVE    Leukocytes, UA NEGATIVE  NEGATIVE   CBC     Status: Normal   Collection Time   02/14/11 10:30 AM      Component Value Range   WBC 5.8  4.0 - 10.5 (K/uL)   RBC 4.01  3.87 - 5.11 (MIL/uL)   Hemoglobin 12.7  12.0 - 15.0 (g/dL)   HCT 19.1  47.8 - 29.5 (%)   MCV 93.8  78.0 - 100.0 (fL)    MCH 31.7  26.0 - 34.0 (pg)   MCHC 33.8  30.0 - 36.0 (g/dL)   RDW 62.1  30.8 - 65.7 (%)   Platelets 290  150 - 400 (K/uL)    [place] today continues to show probable early 5wk IUGS. No adnexal masses or free fluid noted.  Discussed this pt with Dr. Debroah Loop. Will have her return in 2 days for repeat B-quant. Assessment and Plan  Abd pain in preg: pt will return in 2 days. Discussed diet, activity, risks, and precautions. Pt is aware of ectopic precautions.  Clinton Gallant. Donjuan Robison III, DrHSc, MPAS, PA-C  02/14/2011, 11:21 AM   Henrietta Hoover, PA 02/14/11 1128

## 2011-02-16 ENCOUNTER — Inpatient Hospital Stay (HOSPITAL_COMMUNITY)
Admission: AD | Admit: 2011-02-16 | Discharge: 2011-02-16 | Disposition: A | Discharge status: Home / Self Care | Payer: Medicaid Other | Source: Ambulatory Visit | Attending: Obstetrics & Gynecology | Admitting: Obstetrics & Gynecology

## 2011-02-16 DIAGNOSIS — O200 Threatened abortion: Secondary | ICD-10-CM

## 2011-02-16 LAB — HCG, QUANTITATIVE, PREGNANCY: hCG, Beta Chain, Quant, S: 197 m[IU]/mL — ABNORMAL HIGH (ref <5)

## 2011-02-16 NOTE — Progress Notes (Signed)
Pt here for follow up labs denies pain or bleeding

## 2011-02-16 NOTE — Progress Notes (Signed)
Notified of BHCG results . CNM talked with pt and repeat  bhcg and u/s scheduled for Friday.

## 2011-02-16 NOTE — ED Provider Notes (Signed)
History     No chief complaint on file.  HPI Presents for repeat Quant HCG.  Has been followed for inappropriately rising HCG levels. Last U/S showed very small IUGS with no pole or yolk sac. Has appt for Quant and U/S Friday.     Past Medical History  Diagnosis Date  . Abnormal Pap smear     repeat WNL    Past Surgical History  Procedure Date  . Cesarean section   . Wisdom tooth extraction 2004    Family History  Problem Relation Age of Onset  . Hypertension Maternal Grandmother   . Stroke Maternal Grandmother   . Cancer Paternal Grandmother     brain, lung  . Diabetes Paternal Grandmother   . Heart disease Paternal Grandmother   . Hypertension Paternal Grandmother     History  Substance Use Topics  . Smoking status: Never Smoker   . Smokeless tobacco: Never Used  . Alcohol Use: No    Allergies:  Allergies  Allergen Reactions  . Flagyl (Metronidazole Hcl) Hives and Nausea And Vomiting    Prescriptions prior to admission  Medication Sig Dispense Refill  . acetaminophen (TYLENOL) 500 MG tablet Take 500 mg by mouth 3 (three) times daily as needed. For pain       . prenatal vitamin w/FE, FA (PRENATAL 1 + 1) 27-1 MG TABS Take 1 tablet by mouth daily.        Marland Kitchen sulfamethoxazole-trimethoprim (BACTRIM DS,SEPTRA DS) 800-160 MG per tablet Take 1 tablet by mouth 2 (two) times daily. Pt finished this med 2 weeks ago (01/31/11)        ROS Physical Exam   Last menstrual period 01/10/2011, unknown if currently breastfeeding.  Physical Exam Exam Deferred  MAU Course  Procedures   Assessment and Plan  Discussed inappropriate rise in [facility].  Discussed that we need to rule out Ectopic and that likelihood of normal IUP is very small at this point. Pt has appt for This Friday for HCG and [place].   Precautions reviewed.  [facility] 02/16/2011, 10:42 AM

## 2011-02-17 NOTE — ED Provider Notes (Signed)
Agree with above note.  Andrea Dean 02/17/2011 3:03 PM

## 2011-02-17 NOTE — ED Provider Notes (Signed)
Agree with above note.  Andrea Dean 02/17/2011 3:21 PM

## 2011-02-18 ENCOUNTER — Encounter (HOSPITAL_COMMUNITY): Payer: Self-pay | Admitting: *Deleted

## 2011-02-18 ENCOUNTER — Inpatient Hospital Stay (HOSPITAL_COMMUNITY)
Admission: AD | Admit: 2011-02-18 | Discharge: 2011-02-18 | Disposition: A | Discharge status: Home / Self Care | Payer: Medicaid Other | Source: Ambulatory Visit | Attending: Family Medicine | Admitting: Family Medicine

## 2011-02-18 DIAGNOSIS — O200 Threatened abortion: Secondary | ICD-10-CM | POA: Insufficient documentation

## 2011-02-18 LAB — CBC
HCT: 39.5 % (ref 36.0–46.0)
Hemoglobin: 13.2 g/dL (ref 12.0–15.0)
MCH: 31.7 pg (ref 26.0–34.0)
MCHC: 33.4 g/dL (ref 30.0–36.0)
MCV: 94.7 fL (ref 78.0–100.0)
Platelets: 305 10*3/uL (ref 150–400)
RBC: 4.17 MIL/uL (ref 3.87–5.11)
RDW: 12.2 % (ref 11.5–15.5)
WBC: 7.2 10*3/uL (ref 4.0–10.5)

## 2011-02-18 LAB — HCG, QUANTITATIVE, PREGNANCY: hCG, Beta Chain, Quant, S: 206 m[IU]/mL — ABNORMAL HIGH (ref <5)

## 2011-02-18 NOTE — Progress Notes (Signed)
Started spotting @ 1615 today and has gradually gotten heavier; pt's Hcg has not been doubling; pt is 5 week 6 days today;

## 2011-02-18 NOTE — ED Provider Notes (Signed)
History   The pt is a 25 year-old  G5P2022 at 5.4 weeks by LMP who has been followed for inappropriately rising quants w/ an [place] on 02/16/11 showing ?small intrauterine GS vs pseudosac. She was schedul;ed to return to [facility] on 02/22/11 for F/U US and quant, but came today for new onset of light VB. She denies abd pain.   No chief complaint on file.  HPI  Past Medical History  Diagnosis Date  . Abnormal Pap smear     repeat WNL  . No pertinent past medical history     Past Surgical History  Procedure Date  . Cesarean section   . Wisdom tooth extraction 2004  . Tooth extraction     Family History  Problem Relation Age of Onset  . Hypertension Maternal Grandmother   . Stroke Maternal Grandmother   . Cancer Paternal Grandmother     brain, lung  . Diabetes Paternal Grandmother   . Heart disease Paternal Grandmother   . Hypertension Paternal Grandmother     History  Substance Use Topics  . Smoking status: Never Smoker   . Smokeless tobacco: Never Used  . Alcohol Use: No    OB History    Grav Para Term Preterm Abortions TAB SAB Ect Mult Living   5 2 2  2 2    2       Review of Systems othewise neg Physical Exam  BP 139/72  Pulse 79  Temp(Src) 98.8 F (37.1 C) (Oral)  Resp 18  Ht 5\' 3"  (1.6 m)  Wt 54.432 kg (120 lb)  BMI 21.26 kg/m2  LMP 01/10/2011 Results for orders placed during the hospital encounter of 02/18/11 (from the past 24 hour(s))  HCG, QUANTITATIVE, PREGNANCY     Status: Abnormal   Collection Time   02/18/11  6:39 PM      Component Value Range   hCG, Beta Chain, Quant, S 206 (*) <5 (mIU/mL)  CBC     Status: Normal   Collection Time   02/18/11  6:58 PM      Component Value Range   WBC 7.2  4.0 - 10.5 (K/uL)   RBC 4.17  3.87 - 5.11 (MIL/uL)   Hemoglobin 13.2  12.0 - 15.0 (g/dL)   HCT 16.1  09.6 - 04.5 (%)   MCV 94.7  78.0 - 100.0 (fL)   MCH 31.7  26.0 - 34.0 (pg)   MCHC 33.4  30.0 - 36.0 (g/dL)   RDW 40.9  81.1 - 91.4 (%)   Platelets 305  150 - 400  (K/uL)    Physical Exam  ED Course  Procedures Assessment: 1. Consulted w/ Dr. Shawnie Pons RE: quants and labs. Still unable to determine IUP SAB in progress vs ectopic.   Plan: 1. Offered expectant management vs Methotrexate. Pt elects expectant management 2. D/C home w/ ectopic precautions. 3. Support given 4. F/U 02/21/11 for quant and [place]

## 2011-02-21 ENCOUNTER — Ambulatory Visit (HOSPITAL_COMMUNITY): Admit: 2011-02-21 | Payer: Self-pay

## 2011-02-21 ENCOUNTER — Inpatient Hospital Stay (HOSPITAL_COMMUNITY)
Admission: AD | Admit: 2011-02-21 | Discharge: 2011-02-21 | Disposition: A | Discharge status: Home / Self Care | Payer: Self-pay | Source: Ambulatory Visit | Attending: Obstetrics and Gynecology | Admitting: Obstetrics and Gynecology

## 2011-02-21 ENCOUNTER — Inpatient Hospital Stay (HOSPITAL_COMMUNITY): Payer: Self-pay

## 2011-02-21 ENCOUNTER — Inpatient Hospital Stay (HOSPITAL_COMMUNITY): Admit: 2011-02-21 | Payer: Self-pay

## 2011-02-21 DIAGNOSIS — O039 Complete or unspecified spontaneous abortion without complication: Secondary | ICD-10-CM | POA: Insufficient documentation

## 2011-02-21 LAB — HCG, QUANTITATIVE, PREGNANCY: hCG, Beta Chain, Quant, S: 34 m[IU]/mL — ABNORMAL HIGH (ref <5)

## 2011-02-21 IMAGING — US US OB TRANSVAGINAL
1 series · 13 of 28 positions shown · non-contrast
Comparison: none

[Series 1: us ob comp less 14 wks · 13 of 29 slices shown]
[im 2/29]
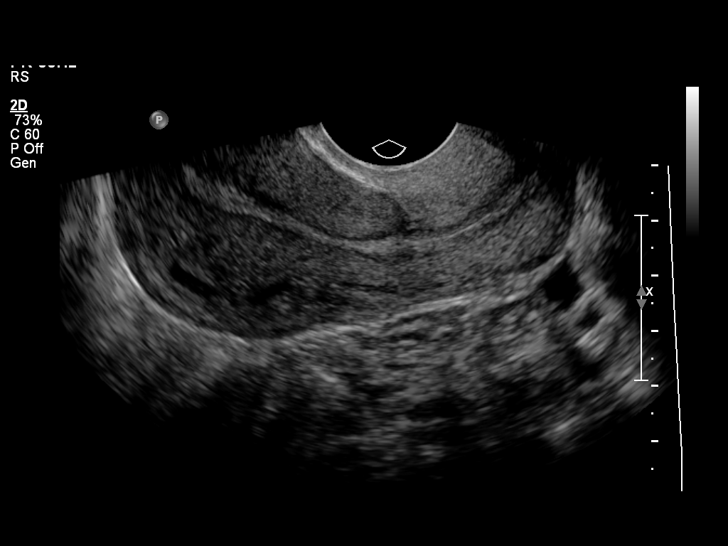
[im 4/29]
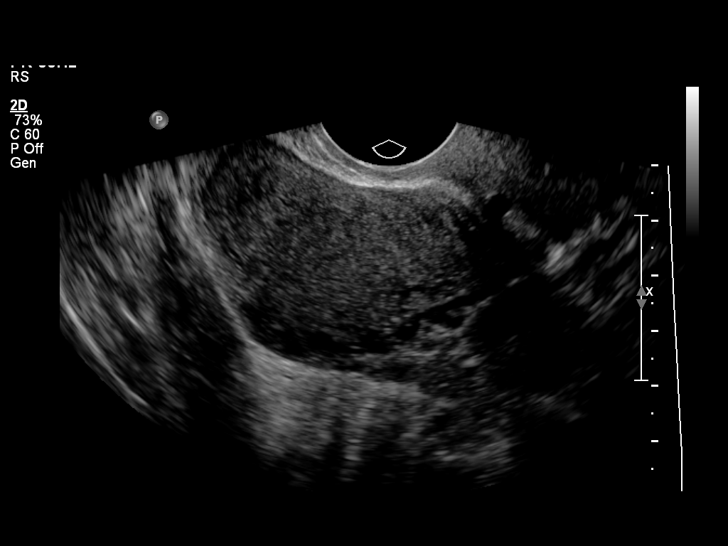
[im 6/29]
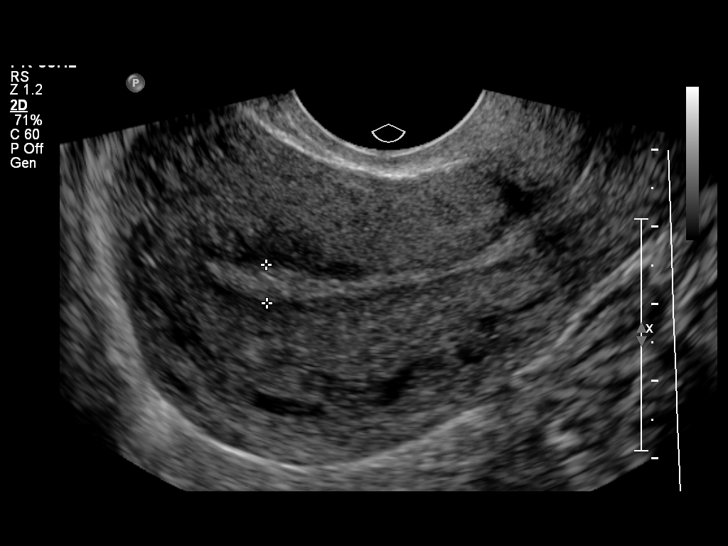
[im 8/29]
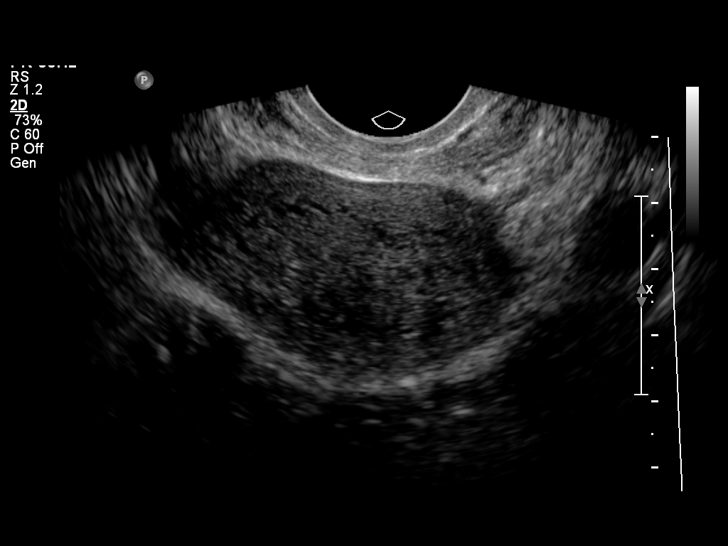
[im 10/29]
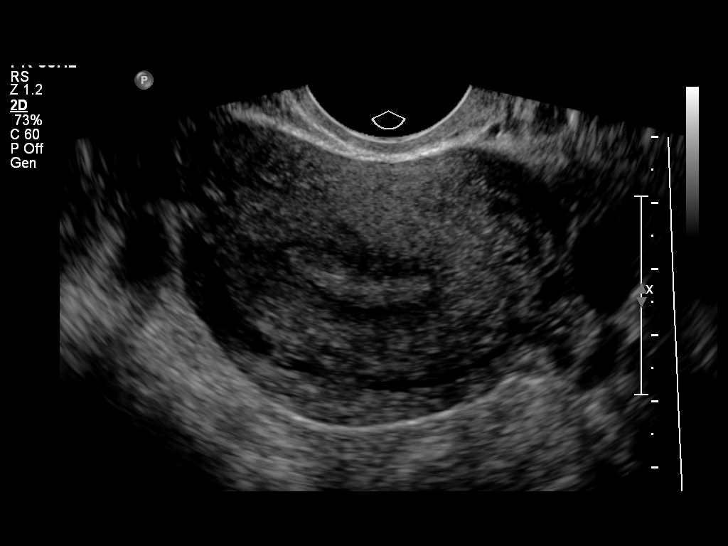
[im 12/29]
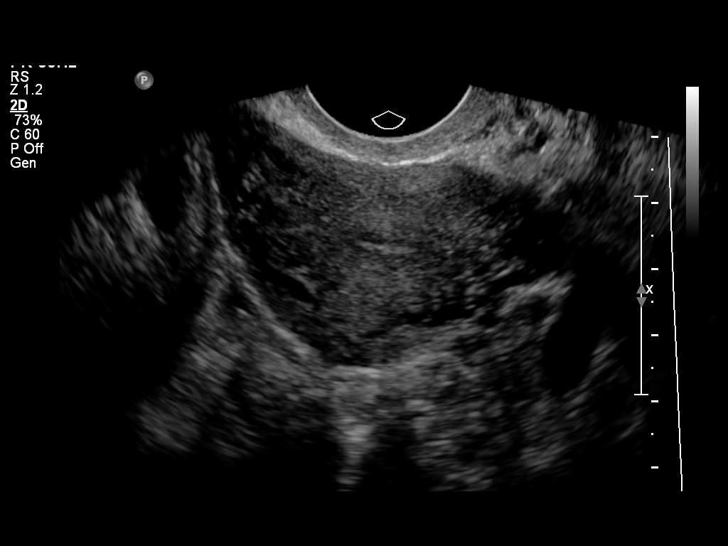
[im 15/29]
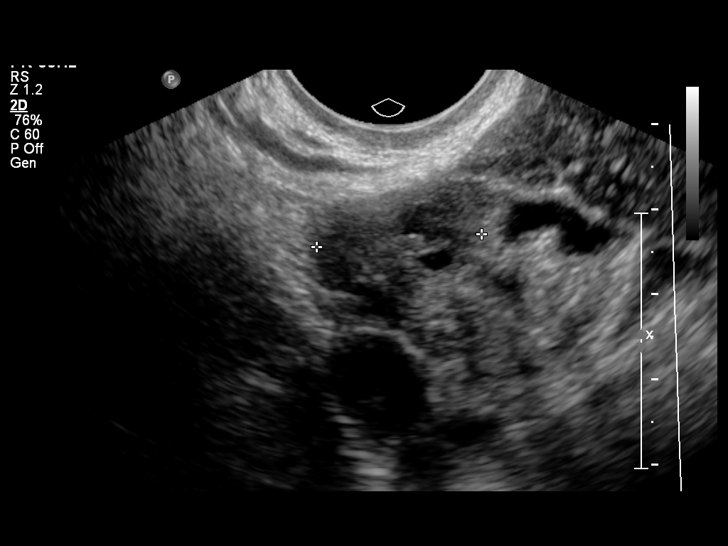
[im 17/29]
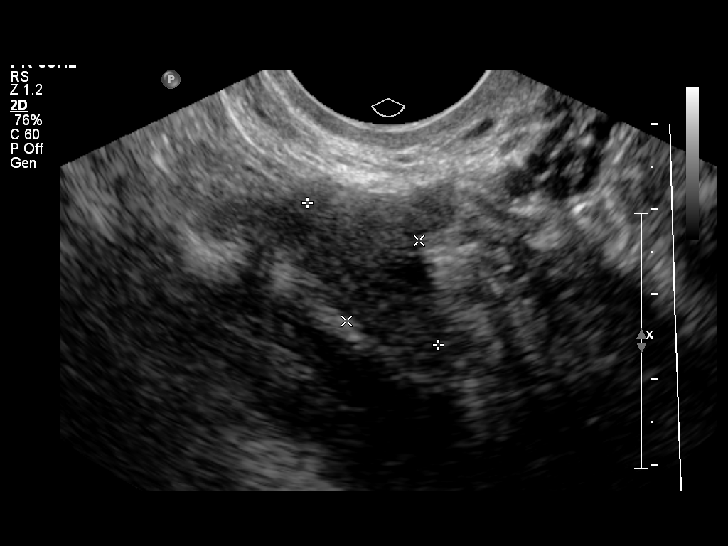
[im 19/29]
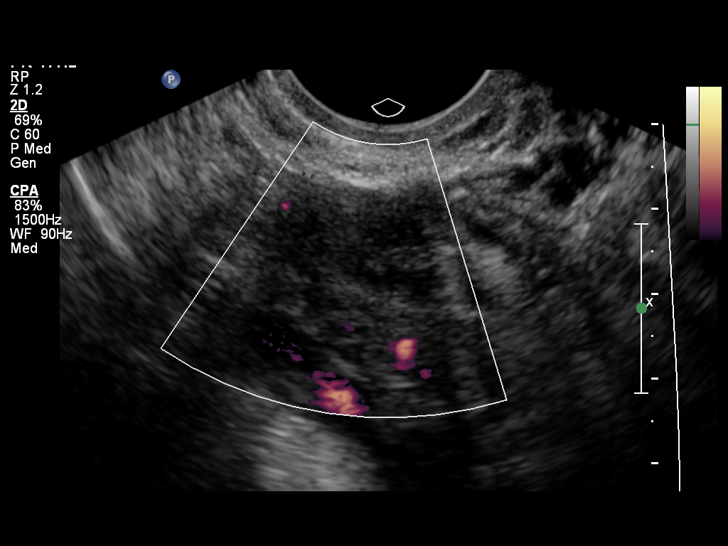
[im 21/29]
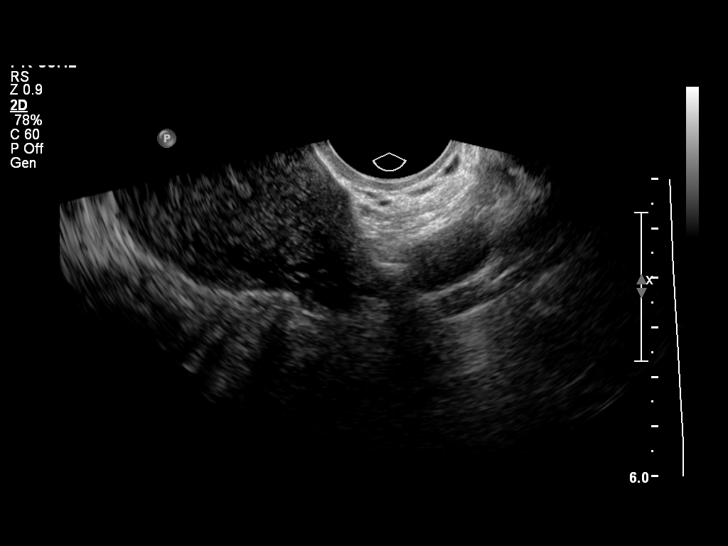
[im 23/29]
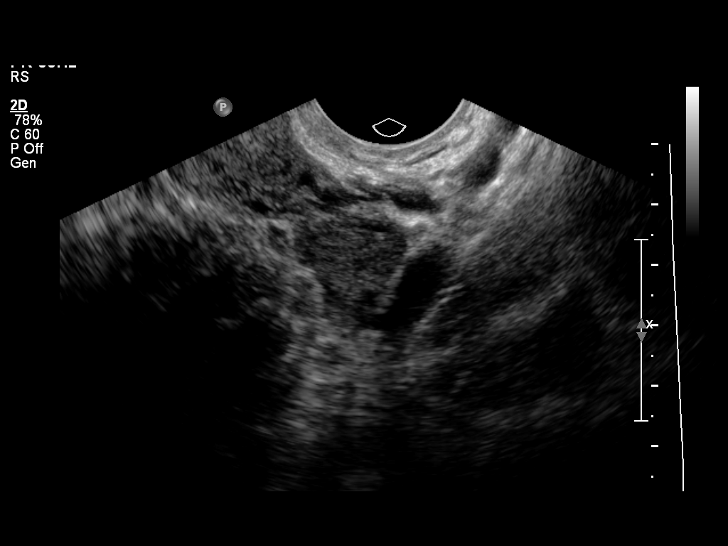
[im 25/29]
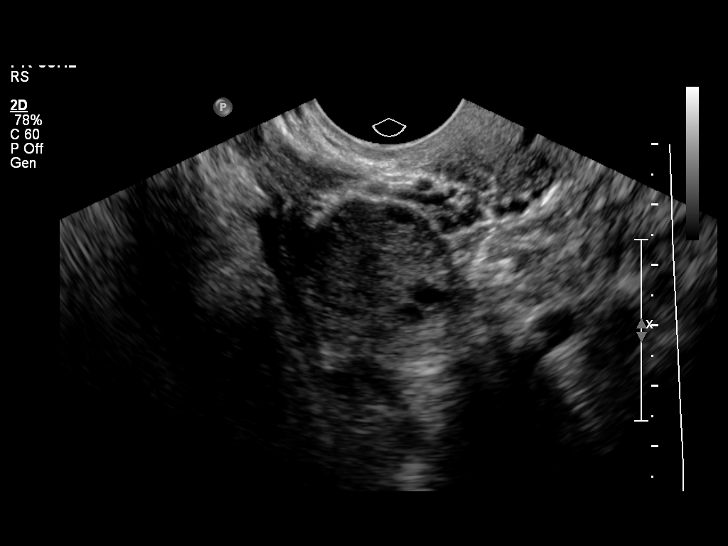
[im 27/29]
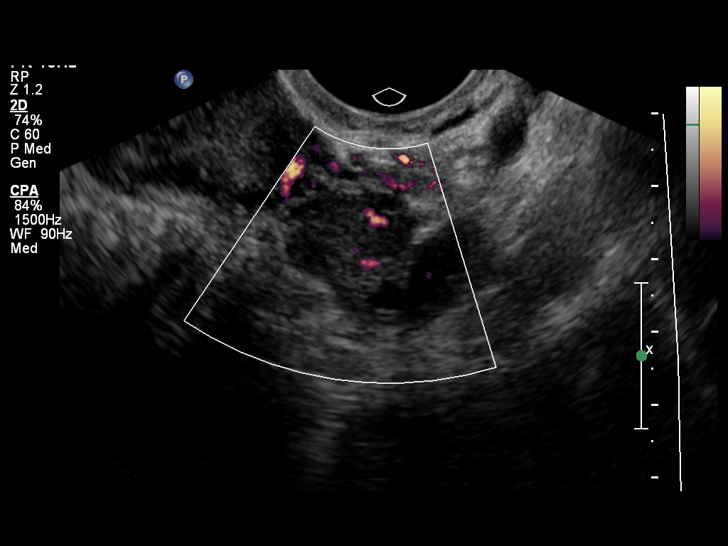

[13 of 28 positions shown; findings below may reference images not displayed]

OBSTETRICS REPORT
                      (Signed Final [DATE] [DATE])

Procedures

 US OB TRANSVAGINAL                                    76817.0
Indications

 Vaginal bleeding, unknown etiology
Fetal Evaluation

 Gest. Sac:         None seen
 Yolk Sac:          Not visualized
 Fetal Pole:        Not visualized
 Cardiac Activity:  No embryo visualized

 Comment:    Endo stripe measures 5mm.
Gestational Age

 LMP:           6w 0d         Date:  [DATE]                 EDD:   [DATE]
 Best:          6w 0d      Det. By:  LMP  ([DATE])          EDD:   [DATE]
Cervix Uterus Adnexa

 Cervix:       Normal appearance by transvaginal scan
 Cul De Sac:   No free fluid seen.

 Left Ovary:    Within normal limits. Small corpus luteum noted.
 Right Ovary:   Within normal limits.
 Adnexa:     No abnormality visualized.
Comments

 No Bhcg level for correlation.
Impression

 There is no evidence of intrauterine or ectopic pregnancy at
 this time.  Differential diagnosis includes early,
 radiographically occult pregnancy (intrauterine or ectopic) vs.
 spontaneous abortion.   Please continue to follow with serial
 Bhcg levels and ultrasound, as clinically indicated.

 questions or concerns.

## 2011-02-21 NOTE — Progress Notes (Signed)
Here for follow up BHCG and [place], sent to [place] for 0845 appts and will return to [facility]

## 2011-02-21 NOTE — ED Provider Notes (Signed)
History     No chief complaint on file.  HPI Pt returns for repeat HCG.  She was initially seen on 7/29 with bleeding in pregnancy and had HCG of 197.  Her follow up HCG on 7/31 was 206 and was diagnosed with spontaneous ab.  Today she returns for f/u HCG.  She is having minimal bleeding and no pain.  Her HCG is 34 today.  Pt is aware that she had a spontaneous ab.   Past Medical History  Diagnosis Date  . Abnormal Pap smear     repeat WNL  . No pertinent past medical history     Past Surgical History  Procedure Date  . Cesarean section   . Wisdom tooth extraction 2004  . Tooth extraction     Family History  Problem Relation Age of Onset  . Hypertension Maternal Grandmother   . Stroke Maternal Grandmother   . Cancer Paternal Grandmother     brain, lung  . Diabetes Paternal Grandmother   . Heart disease Paternal Grandmother   . Hypertension Paternal Grandmother     History  Substance Use Topics  . Smoking status: Never Smoker   . Smokeless tobacco: Never Used  . Alcohol Use: No    Allergies:  Allergies  Allergen Reactions  . Flagyl (Metronidazole Hcl) Hives and Nausea And Vomiting    Prescriptions prior to admission  Medication Sig Dispense Refill  . acetaminophen (TYLENOL) 500 MG tablet Take 500 mg by mouth 3 (three) times daily as needed. For pain      . prenatal vitamin w/FE, FA (PRENATAL 1 + 1) 27-1 MG TABS Take 1 tablet by mouth daily.         ROS Physical Exam   Blood pressure 116/74, pulse 74, temperature 98.2 F (36.8 C), resp. rate 18, last menstrual period 01/10/2011, SpO2 98.00%.  Physical Exam Pt is alert and oriented in no acute distress with vital signs stable.  MAU Course  Procedures HCG repeated - 34 MDM   Assessment and Plan  Spontaneous ab F/u in GYN clinic in 1 week for repeat HCG Return if increase in pain or bleeding  Prudence Heiny 02/21/2011, 10:34 AM

## 2011-02-26 NOTE — ED Provider Notes (Signed)
Agree with above note.  Brittinee Risk 02/26/2011 4:39 PM   

## 2011-02-28 ENCOUNTER — Other Ambulatory Visit: Payer: Self-pay

## 2011-02-28 DIAGNOSIS — O039 Complete or unspecified spontaneous abortion without complication: Secondary | ICD-10-CM

## 2011-03-01 LAB — HCG, QUANTITATIVE, PREGNANCY: hCG, Beta Chain, Quant, S: <2 m[IU]/mL

## 2011-03-12 ENCOUNTER — Inpatient Hospital Stay (INDEPENDENT_AMBULATORY_CARE_PROVIDER_SITE_OTHER)
Admission: RE | Admit: 2011-03-12 | Discharge: 2011-03-12 | Disposition: A | Discharge status: Home / Self Care | Payer: Self-pay | Source: Ambulatory Visit | Attending: Emergency Medicine | Admitting: Emergency Medicine

## 2011-03-12 DIAGNOSIS — N760 Acute vaginitis: Secondary | ICD-10-CM

## 2011-03-12 LAB — GLUCOSE, CAPILLARY: Glucose-Capillary: 100 mg/dL — ABNORMAL HIGH (ref 70–99)

## 2011-03-12 LAB — POCT URINALYSIS DIP (DEVICE)
Bilirubin Urine: NEGATIVE
Glucose, UA: NEGATIVE mg/dL
Hgb urine dipstick: NEGATIVE
Ketones, ur: 15 mg/dL — AB
Nitrite: NEGATIVE
Protein, ur: NEGATIVE mg/dL
Specific Gravity, Urine: 1.025 (ref 1.005–1.030)
Urobilinogen, UA: 0.2 mg/dL (ref 0.0–1.0)
pH: 6 (ref 5.0–8.0)

## 2011-03-12 LAB — POCT PREGNANCY, URINE: Preg Test, Ur: NEGATIVE

## 2011-03-12 LAB — WET PREP, GENITAL
Clue Cells Wet Prep HPF POC: NONE SEEN
Trich, Wet Prep: NONE SEEN

## 2011-03-13 LAB — GC/CHLAMYDIA PROBE AMP, GENITAL
Chlamydia, DNA Probe: NEGATIVE
GC Probe Amp, Genital: NEGATIVE

## 2011-04-02 ENCOUNTER — Ambulatory Visit: Payer: Self-pay | Admitting: Obstetrics and Gynecology

## 2011-04-09 LAB — URINALYSIS, ROUTINE W REFLEX MICROSCOPIC
Bilirubin Urine: NEGATIVE
Glucose, UA: NEGATIVE
Hgb urine dipstick: NEGATIVE
Ketones, ur: NEGATIVE
Nitrite: NEGATIVE
Protein, ur: NEGATIVE
Specific Gravity, Urine: 1.02
Urobilinogen, UA: 0.2
pH: 6.5

## 2011-04-14 LAB — URINE CULTURE: Colony Count: 15000

## 2011-04-14 LAB — URINALYSIS, ROUTINE W REFLEX MICROSCOPIC
Bilirubin Urine: NEGATIVE
Glucose, UA: NEGATIVE
Hgb urine dipstick: NEGATIVE
Ketones, ur: NEGATIVE
Nitrite: NEGATIVE
Protein, ur: NEGATIVE
Specific Gravity, Urine: >1.03 — ABNORMAL HIGH
Urobilinogen, UA: 0.2
pH: 6

## 2011-04-15 LAB — CBC
HCT: 35.5 — ABNORMAL LOW
Hemoglobin: 12.3
MCHC: 34.6
MCV: 95.8
Platelets: 274
RBC: 3.71 — ABNORMAL LOW
RDW: 12.1
WBC: 13.3 — ABNORMAL HIGH

## 2011-04-15 LAB — URINE CULTURE: Colony Count: 75000

## 2011-04-15 LAB — URINE MICROSCOPIC-ADD ON

## 2011-04-15 LAB — WET PREP, GENITAL
Clue Cells Wet Prep HPF POC: NONE SEEN
Trich, Wet Prep: NONE SEEN
Yeast Wet Prep HPF POC: NONE SEEN

## 2011-04-15 LAB — STREP B DNA PROBE: Strep Group B Ag: NEGATIVE

## 2011-04-15 LAB — HEPATIC FUNCTION PANEL
ALT: 9
AST: 17
Albumin: 2.8 — ABNORMAL LOW
Alkaline Phosphatase: 76
Bilirubin, Direct: 0.1
Indirect Bilirubin: 0.5
Total Bilirubin: 0.6
Total Protein: 6.7

## 2011-04-15 LAB — URINALYSIS, ROUTINE W REFLEX MICROSCOPIC
Bilirubin Urine: NEGATIVE
Glucose, UA: NEGATIVE
Ketones, ur: 15 — AB
Leukocytes, UA: NEGATIVE
Nitrite: NEGATIVE
Protein, ur: 100 — AB
Specific Gravity, Urine: 1.025
Urobilinogen, UA: 1
pH: 7

## 2011-04-15 LAB — BASIC METABOLIC PANEL
BUN: 5 — ABNORMAL LOW
CO2: 25
Calcium: 9
Chloride: 104
Creatinine, Ser: 0.55
GFR calc Af Amer: >60
GFR calc non Af Amer: >60
Glucose, Bld: 77
Potassium: 3.7
Sodium: 136

## 2011-04-17 LAB — CBC
HCT: 27.8 — ABNORMAL LOW
HCT: 31.1 — ABNORMAL LOW
Hemoglobin: 10.8 — ABNORMAL LOW
Hemoglobin: 9.3 — ABNORMAL LOW
MCHC: 33.5
MCHC: 34.8
MCV: 89.4
MCV: 91.2
Platelets: 232
Platelets: 288
RBC: 3.04 — ABNORMAL LOW
RBC: 3.47 — ABNORMAL LOW
RDW: 13.6
RDW: 14
WBC: 12.3 — ABNORMAL HIGH
WBC: 13.7 — ABNORMAL HIGH

## 2011-04-17 LAB — RPR: RPR Ser Ql: NONREACTIVE

## 2011-04-29 LAB — URINALYSIS, ROUTINE W REFLEX MICROSCOPIC
Bilirubin Urine: NEGATIVE
Glucose, UA: NEGATIVE
Hgb urine dipstick: NEGATIVE
Ketones, ur: NEGATIVE
Nitrite: NEGATIVE
Protein, ur: NEGATIVE
Specific Gravity, Urine: 1.02
Urobilinogen, UA: 0.2
pH: 7

## 2011-04-29 LAB — WET PREP, GENITAL
Clue Cells Wet Prep HPF POC: NONE SEEN
Trich, Wet Prep: NONE SEEN
Yeast Wet Prep HPF POC: NONE SEEN

## 2011-04-29 LAB — GC/CHLAMYDIA PROBE AMP, GENITAL
Chlamydia, DNA Probe: NEGATIVE
GC Probe Amp, Genital: NEGATIVE

## 2011-05-05 ENCOUNTER — Inpatient Hospital Stay (INDEPENDENT_AMBULATORY_CARE_PROVIDER_SITE_OTHER)
Admission: RE | Admit: 2011-05-05 | Discharge: 2011-05-05 | Disposition: A | Discharge status: Home / Self Care | Payer: Medicaid Other | Source: Ambulatory Visit | Attending: Family Medicine | Admitting: Family Medicine

## 2011-05-05 DIAGNOSIS — A6000 Herpesviral infection of urogenital system, unspecified: Secondary | ICD-10-CM

## 2011-05-05 LAB — POCT URINALYSIS DIP (DEVICE)
Bilirubin Urine: NEGATIVE
Glucose, UA: NEGATIVE mg/dL
Hgb urine dipstick: NEGATIVE
Ketones, ur: NEGATIVE mg/dL
Nitrite: NEGATIVE
Protein, ur: NEGATIVE mg/dL
Specific Gravity, Urine: >=1.03 (ref 1.005–1.030)
Urobilinogen, UA: 0.2 mg/dL (ref 0.0–1.0)
pH: 6 (ref 5.0–8.0)

## 2011-05-05 LAB — WET PREP, GENITAL
Clue Cells Wet Prep HPF POC: NONE SEEN
Trich, Wet Prep: NONE SEEN
Yeast Wet Prep HPF POC: NONE SEEN

## 2011-05-05 LAB — POCT PREGNANCY, URINE: Preg Test, Ur: NEGATIVE

## 2011-05-06 ENCOUNTER — Inpatient Hospital Stay (INDEPENDENT_AMBULATORY_CARE_PROVIDER_SITE_OTHER)
Admission: RE | Admit: 2011-05-06 | Discharge: 2011-05-06 | Disposition: A | Discharge status: Home / Self Care | Payer: Medicaid Other | Source: Ambulatory Visit | Attending: Family Medicine | Admitting: Family Medicine

## 2011-05-06 DIAGNOSIS — A6000 Herpesviral infection of urogenital system, unspecified: Secondary | ICD-10-CM

## 2011-05-06 LAB — GC/CHLAMYDIA PROBE AMP, GENITAL
Chlamydia, DNA Probe: NEGATIVE
GC Probe Amp, Genital: NEGATIVE

## 2011-05-07 LAB — HSV 1 ANTIBODY, IGG: HSV 1 Glycoprotein G Ab, IgG: <0.1 IV

## 2011-05-07 LAB — HSV 2 ANTIBODY, IGG: HSV 2 Glycoprotein G Ab, IgG: 0.15 IV

## 2011-05-08 LAB — HERPES SIMPLEX VIRUS CULTURE: Culture: DETECTED

## 2011-05-22 NOTE — ED Provider Notes (Signed)
Agree with above note.  Andrea Dean 05/22/2011 12:41 PM

## 2011-05-28 ENCOUNTER — Emergency Department (INDEPENDENT_AMBULATORY_CARE_PROVIDER_SITE_OTHER)
Admission: EM | Admit: 2011-05-28 | Discharge: 2011-05-28 | Disposition: A | Discharge status: Home / Self Care | Payer: Self-pay | Source: Home / Self Care | Attending: Family Medicine | Admitting: Family Medicine

## 2011-05-28 ENCOUNTER — Encounter (HOSPITAL_COMMUNITY): Payer: Self-pay | Admitting: *Deleted

## 2011-05-28 DIAGNOSIS — N760 Acute vaginitis: Secondary | ICD-10-CM

## 2011-05-28 LAB — WET PREP, GENITAL
Clue Cells Wet Prep HPF POC: NONE SEEN
Trich, Wet Prep: NONE SEEN

## 2011-05-28 LAB — POCT PREGNANCY, URINE: Preg Test, Ur: NEGATIVE

## 2011-05-28 MED ORDER — TERCONAZOLE 0.4 % VA CREA: 1.0000 | TOPICAL_CREAM | Freq: Every day | VAGINAL | Status: AC

## 2011-05-28 NOTE — ED Notes (Signed)
Patient called, requesting a medication that would be on the $4 list at [facility]; spoke w dr Lorenza Chick, who authorized Difulcan 150 mg PO, QOD x 5 ; called in to [facility], [address] at pt request

## 2011-05-28 NOTE — ED Provider Notes (Signed)
History     CSN: [id number] Arrival date & time: 05/28/2011  3:45 PM   First MD Initiated Contact with Patient 05/28/11 1509      Chief Complaint  Patient presents with  . Vaginal Discharge    pt with onset of vaginal discharge x 4 days per pt frequent yeast infections    (Consider location/radiation/quality/duration/timing/severity/associated sxs/prior treatment) Patient is a 25 y.o. female presenting with vaginal itching.  Vaginal Itching This is a recurrent problem.  the patient reports she is actually having a recurrent vaginal yeast infection. States she took an antibiotic 2 wks ago. Followed this with 5 diflucan tablets. Now with thick discharge again. States she was checked for diabetes on last visit Past Medical History  Diagnosis Date  . Abnormal Pap smear     repeat WNL  . No pertinent past medical history     Past Surgical History  Procedure Date  . Cesarean section   . Wisdom tooth extraction 2004  . Tooth extraction     Family History  Problem Relation Age of Onset  . Hypertension Maternal Grandmother   . Stroke Maternal Grandmother   . Cancer Paternal Grandmother     brain, lung  . Diabetes Paternal Grandmother   . Heart disease Paternal Grandmother   . Hypertension Paternal Grandmother     History  Substance Use Topics  . Smoking status: Never Smoker   . Smokeless tobacco: Never Used  . Alcohol Use: Yes    OB History    Grav Para Term Preterm Abortions TAB SAB Ect Mult Living   5 2 2  2 2    2       Review of Systems  Constitutional: Negative.   HENT: Negative.   Respiratory: Negative.   Cardiovascular: Negative.   Gastrointestinal: Negative.   Genitourinary: Negative for dysuria, frequency, flank pain and genital sores.    Allergies  Flagyl  Home Medications   Current Outpatient Rx  Name Route Sig Dispense Refill  . ACETAMINOPHEN 500 MG PO TABS Oral Take 500 mg by mouth 3 (three) times daily as needed. For pain    . PRENATAL  PLUS 27-1 MG PO TABS Oral Take 1 tablet by mouth daily.     . TERCONAZOLE 0.4 % VA CREA Vaginal Place 1 applicator vaginally at bedtime. 45 g 0    BP 109/74  Pulse 83  Temp(Src) 98.7 F (37.1 C) (Oral)  Resp 20  SpO2 100%  LMP 05/13/2011  Breastfeeding? No  Physical Exam  Constitutional: She appears well-developed and well-nourished. No distress.  Cardiovascular: Normal rate and regular rhythm.   Pulmonary/Chest: Effort normal and breath sounds normal.  Abdominal: Soft. There is no tenderness. There is no rebound and no guarding.  Genitourinary:       Pelvic exam with female nursing personal(Bonnie) assisting reveals no skin or vulvar lesions. Thick discharge with green tint noted. No cmt. Samples collected.     ED Course  Procedures (including critical care time)  Labs Reviewed  WET PREP, GENITAL - Abnormal; Notable for the following:    Yeast, Wet Prep MANY (*)    WBC, Wet Prep HPF POC TOO NUMEROUS TO COUNT (*)    All other components within normal limits  POCT PREGNANCY, URINE  POCT PREGNANCY, URINE  GC/CHLAMYDIA PROBE AMP, GENITAL   No results found.   1. Vaginitis       MDM          Randa Spike, MD 05/28/11 [phone]

## 2011-05-29 LAB — GC/CHLAMYDIA PROBE AMP, GENITAL
Chlamydia, DNA Probe: NEGATIVE
GC Probe Amp, Genital: NEGATIVE

## 2011-05-30 NOTE — ED Notes (Signed)
Chart and labs reviewed. Pt. Adequately treated with Terazol vaginal cream.

## 2011-08-07 ENCOUNTER — Emergency Department (HOSPITAL_COMMUNITY)
Admission: EM | Admit: 2011-08-07 | Discharge: 2011-08-07 | Disposition: A | Discharge status: Home / Self Care | Payer: Self-pay | Attending: Emergency Medicine | Admitting: Emergency Medicine

## 2011-08-07 ENCOUNTER — Encounter (HOSPITAL_COMMUNITY): Payer: Self-pay | Admitting: *Deleted

## 2011-08-07 DIAGNOSIS — R112 Nausea with vomiting, unspecified: Secondary | ICD-10-CM | POA: Insufficient documentation

## 2011-08-07 DIAGNOSIS — Z3202 Encounter for pregnancy test, result negative: Secondary | ICD-10-CM | POA: Insufficient documentation

## 2011-08-07 DIAGNOSIS — R109 Unspecified abdominal pain: Secondary | ICD-10-CM | POA: Insufficient documentation

## 2011-08-07 LAB — URINE MICROSCOPIC-ADD ON

## 2011-08-07 LAB — URINALYSIS, ROUTINE W REFLEX MICROSCOPIC
Bilirubin Urine: NEGATIVE
Glucose, UA: NEGATIVE mg/dL
Hgb urine dipstick: NEGATIVE
Ketones, ur: NEGATIVE mg/dL
Nitrite: NEGATIVE
Protein, ur: NEGATIVE mg/dL
Specific Gravity, Urine: 1.028 (ref 1.005–1.030)
Urobilinogen, UA: 0.2 mg/dL (ref 0.0–1.0)
pH: 6 (ref 5.0–8.0)

## 2011-08-07 LAB — POCT PREGNANCY, URINE: Preg Test, Ur: NEGATIVE

## 2011-08-07 MED ORDER — ONDANSETRON 4 MG PO TBDP: 8.0000 mg | ORAL_TABLET | Freq: Once | ORAL | Status: DC

## 2011-08-07 NOTE — ED Provider Notes (Signed)
History     CSN: [id number]  Arrival date & time 08/07/11  1251   First MD Initiated Contact with Patient 08/07/11 1335      Chief Complaint  Patient presents with  . Emesis  . Abdominal Pain    lower    (Consider location/radiation/quality/duration/timing/severity/associated sxs/prior treatment) HPI Comments: Patient presents complaining of some mild abdominal cramping since Sunday.  She had some mild nausea and vomiting yesterday as well.  She states to the pregnancy test it was unclear if it was positive or negative so she came here to obtain a repeat pregnancy test to determine a she's pregnant.  She has had a history of a C-section before.  She denies any diarrhea.  She currently has mild nausea but has not had any emesis today.  Denies any URI symptoms.  Her last menstrual period was December 21 and normal for her.  Patient is a 26 y.o. female presenting with vomiting and abdominal pain. The history is provided by the patient. No language interpreter was used.  Emesis  This is a new problem. The emesis has an appearance of stomach contents. There has been no fever. Associated symptoms include abdominal pain. Pertinent negatives include no chills, no cough, no diarrhea, no fever and no headaches.  Abdominal Pain The primary symptoms of the illness include abdominal pain, nausea and vomiting. The primary symptoms of the illness do not include fever, shortness of breath, diarrhea, dysuria or vaginal discharge.  Symptoms associated with the illness do not include chills or back pain.    Past Medical History  Diagnosis Date  . Abnormal Pap smear     repeat WNL  . No pertinent past medical history     Past Surgical History  Procedure Date  . Cesarean section   . Wisdom tooth extraction 2004  . Tooth extraction     Family History  Problem Relation Age of Onset  . Hypertension Maternal Grandmother   . Stroke Maternal Grandmother   . Cancer Paternal Grandmother     brain,  lung  . Diabetes Paternal Grandmother   . Heart disease Paternal Grandmother   . Hypertension Paternal Grandmother     History  Substance Use Topics  . Smoking status: Former Games developer  . Smokeless tobacco: Never Used  . Alcohol Use: Yes     occ    OB History    Grav Para Term Preterm Abortions TAB SAB Ect Mult Living   5 2 2  2 2    2       Review of Systems  Constitutional: Negative.  Negative for fever and chills.  HENT: Negative.   Eyes: Negative.  Negative for discharge and redness.  Respiratory: Negative.  Negative for cough and shortness of breath.   Cardiovascular: Negative.  Negative for chest pain.  Gastrointestinal: Positive for nausea, vomiting and abdominal pain. Negative for diarrhea.  Genitourinary: Negative.  Negative for dysuria and vaginal discharge.  Musculoskeletal: Negative.  Negative for back pain.  Skin: Negative.  Negative for color change and rash.  Neurological: Negative.  Negative for syncope and headaches.  Hematological: Negative.  Negative for adenopathy.  Psychiatric/Behavioral: Negative.  Negative for confusion.  All other systems reviewed and are negative.    Allergies  Flagyl and Latex  Home Medications   Current Outpatient Rx  Name Route Sig Dispense Refill  . ACETAMINOPHEN 500 MG PO TABS Oral Take 500 mg by mouth 3 (three) times daily as needed. For pain  BP 119/75  Pulse 77  Temp(Src) 97.7 F (36.5 C) (Oral)  Resp 16  SpO2 100%  LMP 01/10/2011  Physical Exam  Nursing note and vitals reviewed. Constitutional: She is oriented to person, place, and time. She appears well-developed and well-nourished.  Non-toxic appearance. She does not have a sickly appearance.  HENT:  Head: Normocephalic and atraumatic.  Eyes: Conjunctivae, EOM and lids are normal. Pupils are equal, round, and reactive to light. No scleral icterus.  Neck: Trachea normal and normal range of motion. Neck supple.  Cardiovascular: Normal rate, regular rhythm  and normal heart sounds.   Pulmonary/Chest: Effort normal and breath sounds normal.  Abdominal: Soft. Normal appearance. She exhibits no distension. There is no tenderness. There is no rebound, no guarding and no CVA tenderness.  Musculoskeletal: Normal range of motion.  Neurological: She is alert and oriented to person, place, and time. She has normal strength.  Skin: Skin is warm, dry and intact. No rash noted.  Psychiatric: She has a normal mood and affect. Her behavior is normal. Judgment and thought content normal.    ED Course  Procedures (including critical care time)  Results for orders placed during the hospital encounter of 08/07/11  URINALYSIS, ROUTINE W REFLEX MICROSCOPIC      Component Value Range   Color, Urine YELLOW  YELLOW    APPearance CLOUDY (*) CLEAR    Specific Gravity, Urine 1.028  1.005 - 1.030    pH 6.0  5.0 - 8.0    Glucose, UA NEGATIVE  NEGATIVE (mg/dL)   Hgb urine dipstick NEGATIVE  NEGATIVE    Bilirubin Urine NEGATIVE  NEGATIVE    Ketones, ur NEGATIVE  NEGATIVE (mg/dL)   Protein, ur NEGATIVE  NEGATIVE (mg/dL)   Urobilinogen, UA 0.2  0.0 - 1.0 (mg/dL)   Nitrite NEGATIVE  NEGATIVE    Leukocytes, UA MODERATE (*) NEGATIVE   POCT PREGNANCY, URINE      Component Value Range   Preg Test, Ur NEGATIVE    URINE MICROSCOPIC-ADD ON      Component Value Range   Squamous Epithelial / LPF MANY (*) RARE    WBC, UA 3-6  <3 (WBC/hpf)   RBC / HPF 0-2  <3 (RBC/hpf)   Bacteria, UA RARE  RARE    Urine-Other MUCOUS PRESENT          MDM  Patient's pregnancy test here is negative.  She has a soft benign abdomen and so is unlikely she is an acute abdominal process at this time.  Patient admits that she is primarily concerned about whether or not she is pregnant.  I've advised her that as she would be very early given the last menstrual period date December 21 that she could repeat a pregnancy test in the next one to 2 weeks with her first morning urine as the best  sample.  She understands this at time of discharge.       Nat Christen, MD 08/07/11 [phone]

## 2011-08-07 NOTE — ED Notes (Signed)
NV and lower abd pain that is lower abdomen.  NV started yesterday and abdominal pain started Sunday.  Pt sts sometimes she has this prior to menses.  Pt took pregnancy test and not sure reading.  LMP--07/11/11.  No vaginal bleeding or discharge and no urinary s/s.  No fever

## 2011-08-10 ENCOUNTER — Inpatient Hospital Stay (HOSPITAL_COMMUNITY)
Admission: AD | Admit: 2011-08-10 | Discharge: 2011-08-10 | Disposition: A | Discharge status: Home / Self Care | Payer: Self-pay | Source: Ambulatory Visit | Attending: Obstetrics & Gynecology | Admitting: Obstetrics & Gynecology

## 2011-08-10 ENCOUNTER — Inpatient Hospital Stay (HOSPITAL_COMMUNITY): Payer: Self-pay

## 2011-08-10 ENCOUNTER — Encounter (HOSPITAL_COMMUNITY): Payer: Self-pay

## 2011-08-10 DIAGNOSIS — O209 Hemorrhage in early pregnancy, unspecified: Secondary | ICD-10-CM

## 2011-08-10 DIAGNOSIS — O210 Mild hyperemesis gravidarum: Secondary | ICD-10-CM | POA: Insufficient documentation

## 2011-08-10 DIAGNOSIS — O219 Vomiting of pregnancy, unspecified: Secondary | ICD-10-CM

## 2011-08-10 HISTORY — DX: Chlamydial infection, unspecified: A74.9

## 2011-08-10 LAB — WET PREP, GENITAL
Clue Cells Wet Prep HPF POC: NONE SEEN
Trich, Wet Prep: NONE SEEN
Yeast Wet Prep HPF POC: NONE SEEN

## 2011-08-10 LAB — CBC
HCT: 37.6 % (ref 36.0–46.0)
Hemoglobin: 12.7 g/dL (ref 12.0–15.0)
MCH: 31.8 pg (ref 26.0–34.0)
MCHC: 33.8 g/dL (ref 30.0–36.0)
MCV: 94.2 fL (ref 78.0–100.0)
Platelets: 267 10*3/uL (ref 150–400)
RBC: 3.99 MIL/uL (ref 3.87–5.11)
RDW: 12.9 % (ref 11.5–15.5)
WBC: 7.5 10*3/uL (ref 4.0–10.5)

## 2011-08-10 LAB — URINALYSIS, ROUTINE W REFLEX MICROSCOPIC
Bilirubin Urine: NEGATIVE
Glucose, UA: NEGATIVE mg/dL
Hgb urine dipstick: NEGATIVE
Ketones, ur: NEGATIVE mg/dL
Leukocytes, UA: NEGATIVE
Nitrite: NEGATIVE
Protein, ur: NEGATIVE mg/dL
Specific Gravity, Urine: 1.015 (ref 1.005–1.030)
Urobilinogen, UA: 0.2 mg/dL (ref 0.0–1.0)
pH: 7.5 (ref 5.0–8.0)

## 2011-08-10 LAB — HCG, QUANTITATIVE, PREGNANCY: hCG, Beta Chain, Quant, S: 71 m[IU]/mL — ABNORMAL HIGH (ref <5)

## 2011-08-10 LAB — POCT PREGNANCY, URINE: Preg Test, Ur: POSITIVE

## 2011-08-10 LAB — ABO/RH: ABO/RH(D): O POS

## 2011-08-10 IMAGING — US US OB COMP LESS 14 WK
1 series · 14 of 28 positions shown · non-contrast
Comparison: none

[Series 1: us ob comp less 14 wks · 14 of 40 slices shown]
[im 2/40]
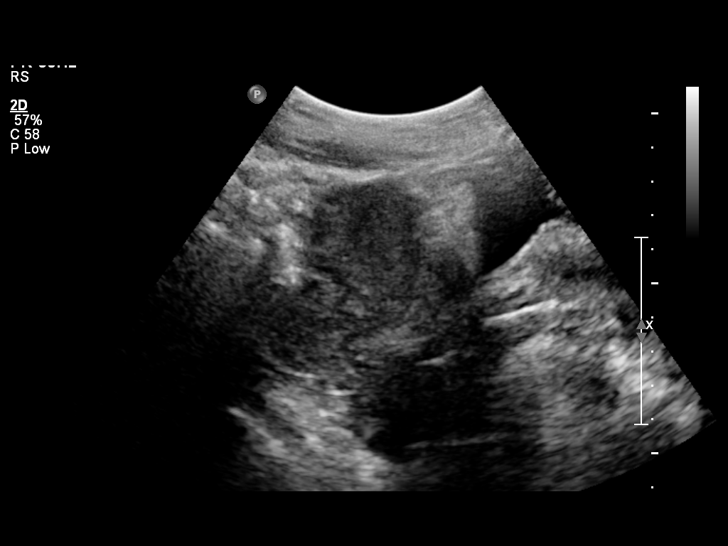
[im 5/40]
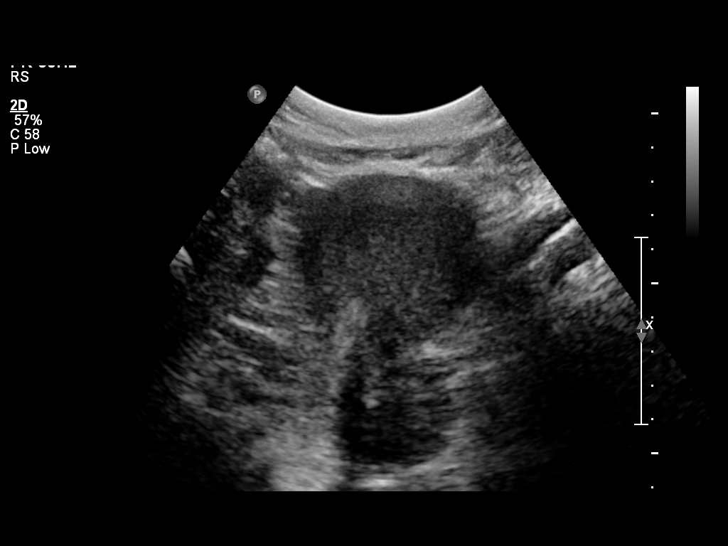
[im 8/40]
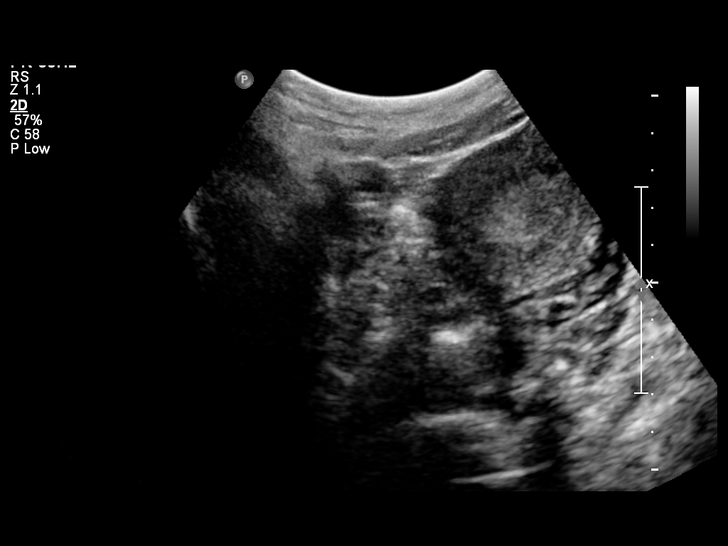
[im 11/40]
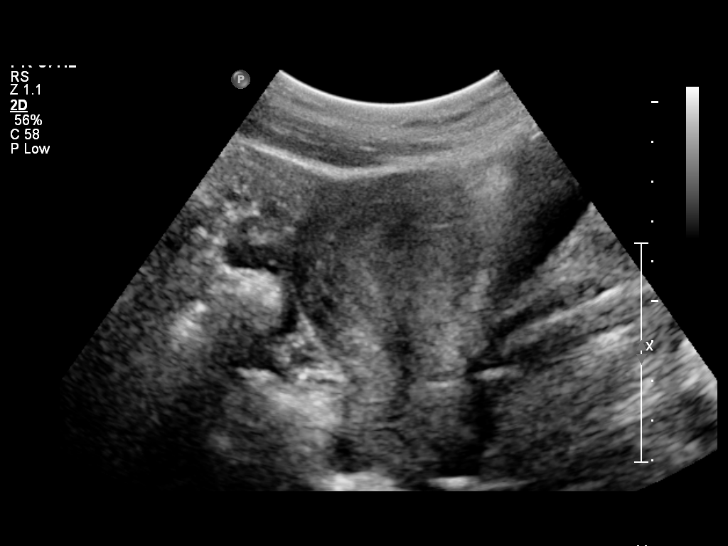
[im 14/40]
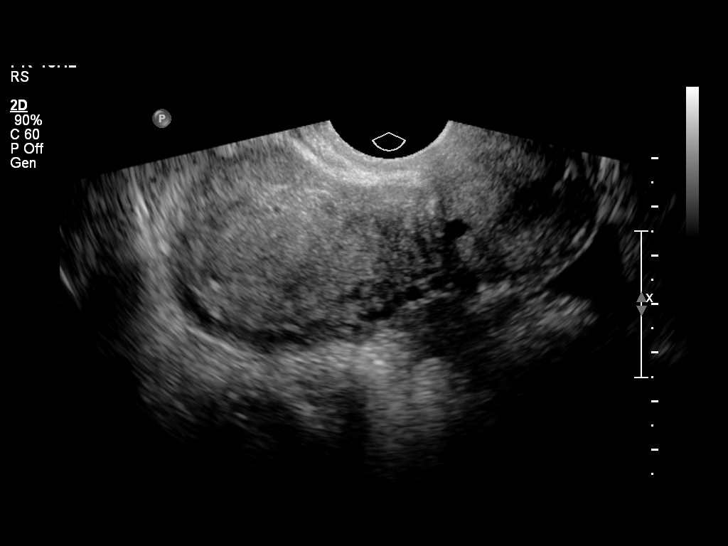
[im 16/40]
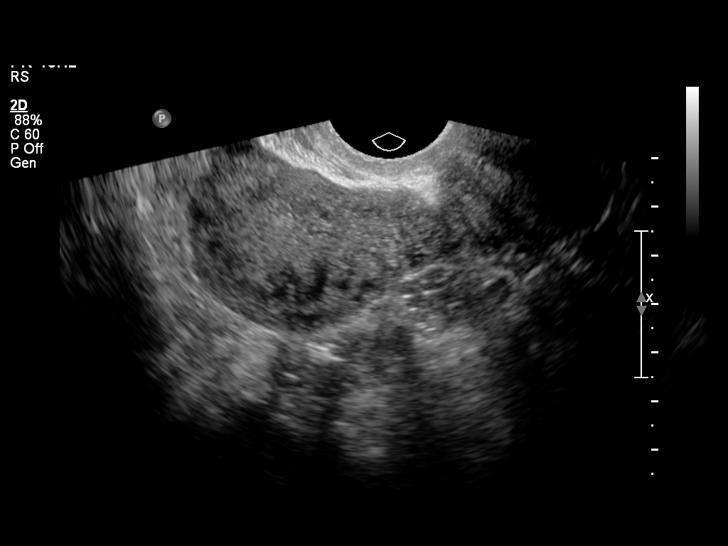
[im 19/40]
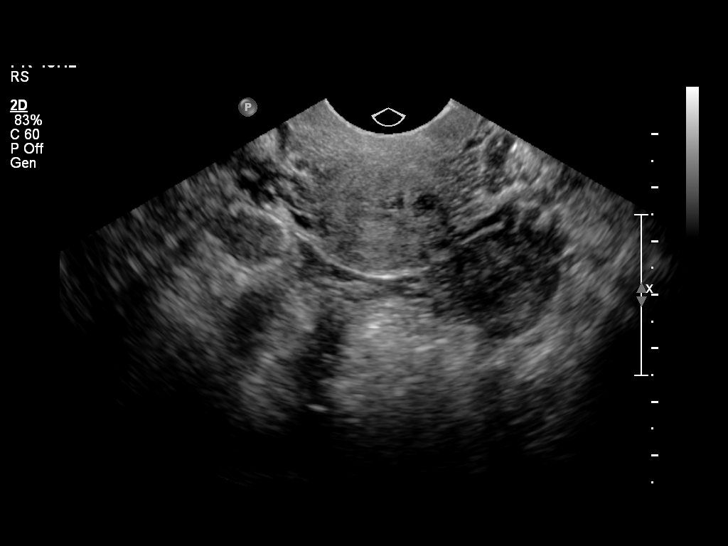
[im 22/40]
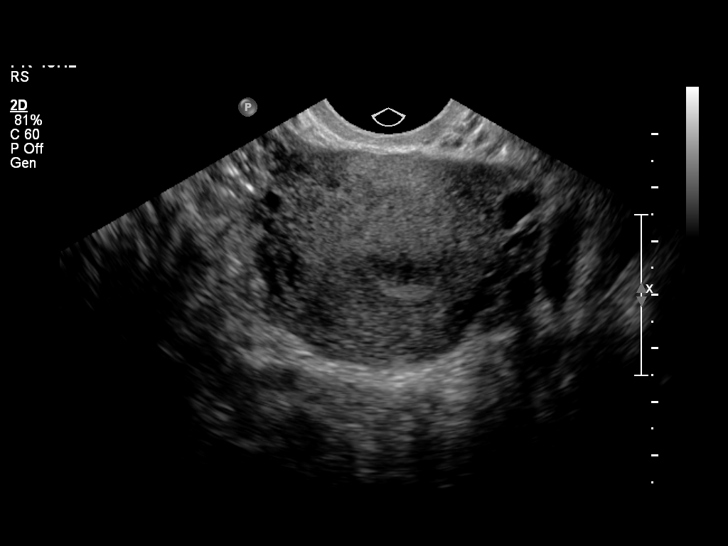
[im 25/40]
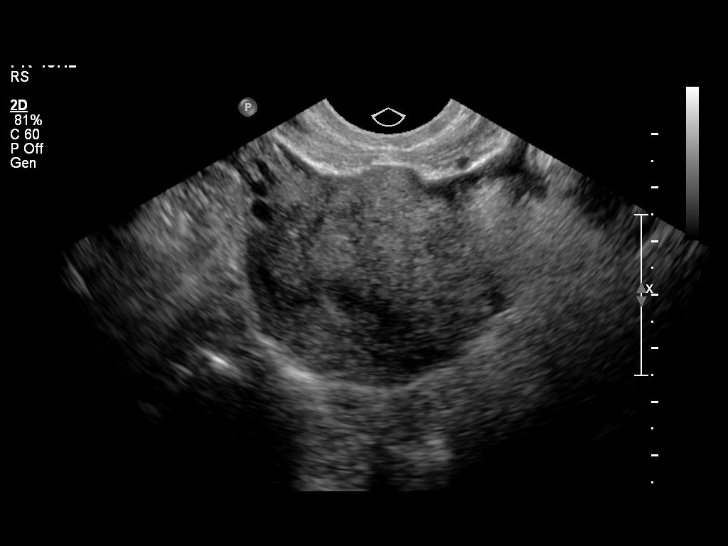
[im 28/40]
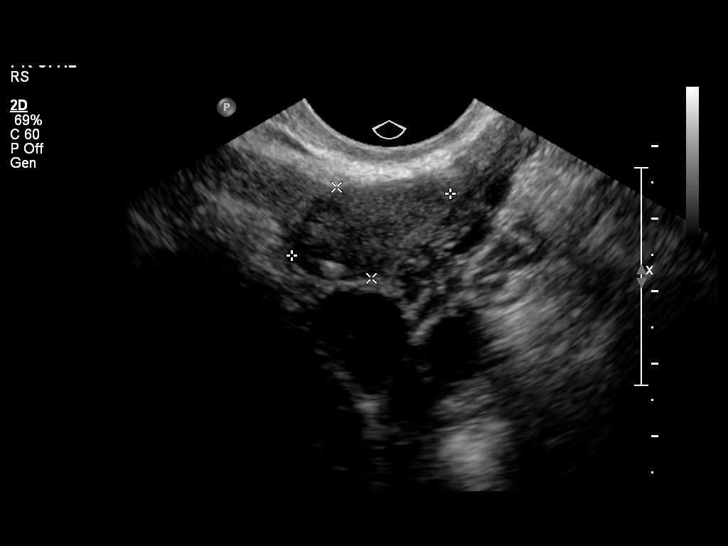
[im 31/40]
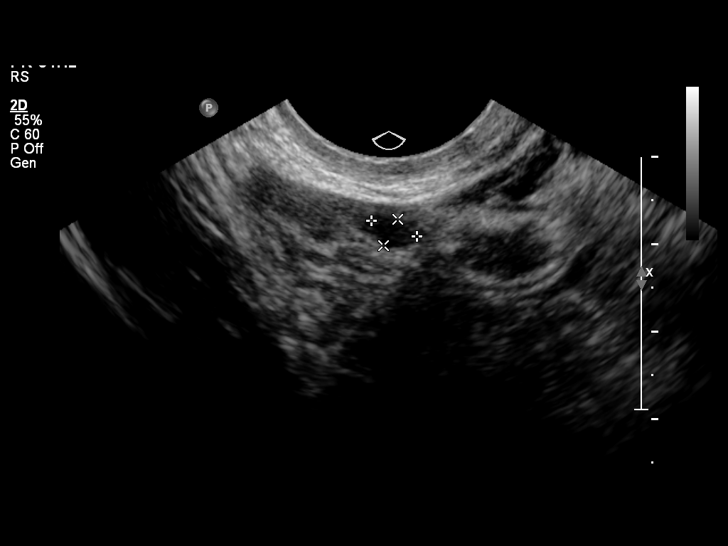
[im 34/40]
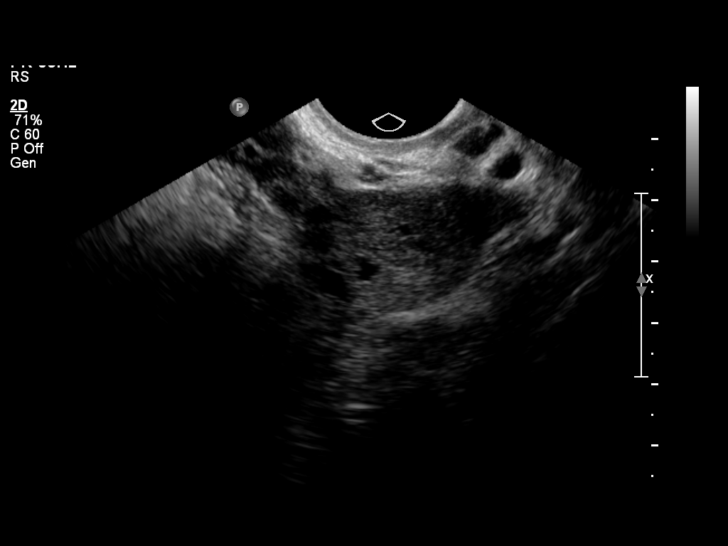
[im 37/40]
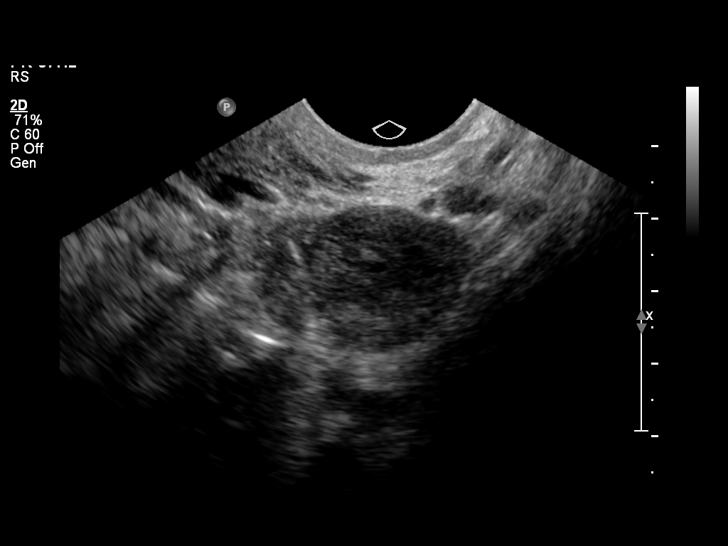
[im 40/40]
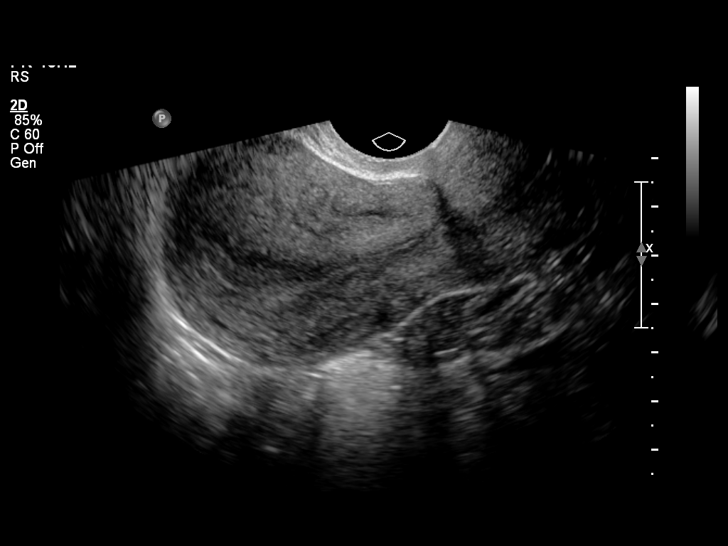

[14 of 28 positions shown; findings below may reference images not displayed]

OBSTETRICS REPORT
                      (Signed Final [DATE] [DATE])

                 11_E
Procedures

 US OB COMP LESS 14 WKS                                76801.0
 US OB TRANSVAGINAL                                    76817.0
Indications

 Pain - Abdominal/Pelvic
Fetal Evaluation

 Gest. Sac:         None seen
 Yolk Sac:          Not visualized
 Fetal Pole:        Not visualized
 Cardiac Activity:  No embryo visualized
Cervix Uterus Adnexa

 Cervix:       Normal appearance by transvaginal scan

 Left Ovary:   Within normal limits. Small corpus luteum noted.
 Right Ovary:  Within normal limits.
 Adnexa:     No abnormality visualized.
Impression

 No intrauterine gestational sac, yolk sac, fetal pole, or cardiac
 activity noted.  Differential considerations include IUP too
 early to visualize, missed abortion, or ectopic pregnancy.
 Recommend follow up US in 10 days and correlation with
 serial quantitative bHCG.

 questions or concerns.

## 2011-08-10 MED ORDER — PROMETHAZINE HCL 25 MG PO TABS: 25.0000 mg | ORAL_TABLET | Freq: Four times a day (QID) | ORAL | Status: DC | PRN

## 2011-08-10 NOTE — Progress Notes (Signed)
Onset of vaginal discharge, with nausea, back and abdominal pain x 1 week.

## 2011-08-10 NOTE — ED Provider Notes (Signed)
History     Chief Complaint  Patient presents with  . Nausea  . Abdominal Cramping  . Vaginal Discharge   HPI Andrea Dean 25 y.o. 4w 2d gestation by LMP. Was seen at [facility] on 08-07-11 but pregnancy test was negative.  MAU pregnancy test is positive today.  Having some nausea and vomiting.  Concerned as her last pregnancy ended as a miscarriage.  Came for evaluation today.   OB History    Grav Para Term Preterm Abortions TAB SAB Ect Mult Living   6 2 2  3 2 1   2       Past Medical History  Diagnosis Date  . Abnormal Pap smear     repeat WNL  . No pertinent past medical history   . Chlamydia     Past Surgical History  Procedure Date  . Cesarean section   . Wisdom tooth extraction 2004  . Tooth extraction     Family History  Problem Relation Age of Onset  . Hypertension Maternal Grandmother   . Stroke Maternal Grandmother   . Cancer Paternal Grandmother     brain, lung  . Diabetes Paternal Grandmother   . Heart disease Paternal Grandmother   . Hypertension Paternal Grandmother     History  Substance Use Topics  . Smoking status: Never Smoker   . Smokeless tobacco: Never Used  . Alcohol Use: Yes     occ    Allergies:  Allergies  Allergen Reactions  . Flagyl (Metronidazole Hcl) Hives and Nausea And Vomiting  . Latex     sensitive    Prescriptions prior to admission  Medication Sig Dispense Refill  . acetaminophen (TYLENOL) 500 MG tablet Take 500 mg by mouth 3 (three) times daily as needed. For pain        ROS Physical Exam   Blood pressure 116/69, pulse 88, temperature 98.8 F (37.1 C), temperature source Oral, resp. rate 16, height 5\' 3"  (1.6 m), weight 121 lb (54.885 kg), last menstrual period 07/11/2011, unknown if currently breastfeeding.  Physical Exam  Nursing note and vitals reviewed. Constitutional: She is oriented to person, place, and time. She appears well-developed and well-nourished.  HENT:  Head: Normocephalic.  Eyes: EOM are  normal.  Neck: Neck supple.  GI: Soft. There is no tenderness.  Genitourinary:       Speculum exam: Vagina - Small amount of creamy discharge, no odor Cervix - No contact bleeding Bimanual exam: Cervix closed Uterus 4-5 weeks in size Adnexa non tender, no masses bilaterally GC/Chlam, wet prep done Chaperone present for exam.  Musculoskeletal: Normal range of motion.  Neurological: She is alert and oriented to person, place, and time.  Skin: Skin is warm and dry.  Psychiatric: She has a normal mood and affect.    MAU Course  Procedures Results for orders placed during the hospital encounter of 08/10/11 (from the past 24 hour(s))  URINALYSIS, ROUTINE W REFLEX MICROSCOPIC     Status: Normal   Collection Time   08/10/11  4:40 PM      Component Value Range   Color, Urine YELLOW  YELLOW    APPearance CLEAR  CLEAR    Specific Gravity, Urine 1.015  1.005 - 1.030    pH 7.5  5.0 - 8.0    Glucose, UA NEGATIVE  NEGATIVE (mg/dL)   Hgb urine dipstick NEGATIVE  NEGATIVE    Bilirubin Urine NEGATIVE  NEGATIVE    Ketones, ur NEGATIVE  NEGATIVE (mg/dL)   Protein,  ur NEGATIVE  NEGATIVE (mg/dL)   Urobilinogen, UA 0.2  0.0 - 1.0 (mg/dL)   Nitrite NEGATIVE  NEGATIVE    Leukocytes, UA NEGATIVE  NEGATIVE   POCT PREGNANCY, URINE     Status: Normal   Collection Time   08/10/11  4:46 PM      Component Value Range   Preg Test, Ur POSITIVE    WET PREP, GENITAL     Status: Abnormal   Collection Time   08/10/11  5:30 PM      Component Value Range   Yeast, Wet Prep NONE SEEN  NONE SEEN    Trich, Wet Prep NONE SEEN  NONE SEEN    Clue Cells, Wet Prep NONE SEEN  NONE SEEN    WBC, Wet Prep HPF POC FEW (*) NONE SEEN   HCG, QUANTITATIVE, PREGNANCY     Status: Abnormal   Collection Time   08/10/11  5:30 PM      Component Value Range   hCG, Beta Chain, Quant, S 71 (*) <5 (mIU/mL)  CBC     Status: Normal   Collection Time   08/10/11  5:30 PM      Component Value Range   WBC 7.5  4.0 - 10.5 (K/uL)   RBC  3.99  3.87 - 5.11 (MIL/uL)   Hemoglobin 12.7  12.0 - 15.0 (g/dL)   HCT 16.1  09.6 - 04.5 (%)   MCV 94.2  78.0 - 100.0 (fL)   MCH 31.8  26.0 - 34.0 (pg)   MCHC 33.8  30.0 - 36.0 (g/dL)   RDW 40.9  81.1 - 91.4 (%)   Platelets 267  150 - 400 (K/uL)  ABO/RH     Status: Normal   Collection Time   08/10/11  5:30 PM      Component Value Range   ABO/RH(D) O POS      MDM Ultrasound - no evidence of IUP - no sac, no yolk sac, ovaries are normal  Assessment and Plan  Nausea and vomiting in early pregnancy  Plan Repeat quant on Wednesday. Pelvic rest rx phenergan for vomiting   [name] 08/10/2011, 6:45 PM   Nolene Bernheim, NP 08/10/11 1851

## 2011-08-11 LAB — GC/CHLAMYDIA PROBE AMP, GENITAL
Chlamydia, DNA Probe: NEGATIVE
GC Probe Amp, Genital: NEGATIVE

## 2011-08-13 ENCOUNTER — Inpatient Hospital Stay (HOSPITAL_COMMUNITY)
Admission: AD | Admit: 2011-08-13 | Discharge: 2011-08-13 | Disposition: A | Discharge status: Home / Self Care | Payer: Self-pay | Source: Ambulatory Visit | Attending: Obstetrics & Gynecology | Admitting: Obstetrics & Gynecology

## 2011-08-13 DIAGNOSIS — N9489 Other specified conditions associated with female genital organs and menstrual cycle: Secondary | ICD-10-CM

## 2011-08-13 DIAGNOSIS — O209 Hemorrhage in early pregnancy, unspecified: Secondary | ICD-10-CM | POA: Insufficient documentation

## 2011-08-13 LAB — HCG, QUANTITATIVE, PREGNANCY: hCG, Beta Chain, Quant, S: 156 m[IU]/mL — ABNORMAL HIGH (ref <5)

## 2011-08-13 NOTE — ED Provider Notes (Signed)
History   Pt presents today for repeat B-quant. She states she is doing well and has no complaints at this time. She denies abd pain, heavy vag bleeding, fever, or any other sx at this time.  Chief Complaint  Patient presents with  . Follow-up   HPI  OB History    Grav Para Term Preterm Abortions TAB SAB Ect Mult Living   6 2 2  3 2 1   2       Past Medical History  Diagnosis Date  . Abnormal Pap smear     repeat WNL  . No pertinent past medical history   . Chlamydia     Past Surgical History  Procedure Date  . Cesarean section   . Wisdom tooth extraction 2004  . Tooth extraction     Family History  Problem Relation Age of Onset  . Hypertension Maternal Grandmother   . Stroke Maternal Grandmother   . Cancer Paternal Grandmother     brain, lung  . Diabetes Paternal Grandmother   . Heart disease Paternal Grandmother   . Hypertension Paternal Grandmother     History  Substance Use Topics  . Smoking status: Never Smoker   . Smokeless tobacco: Never Used  . Alcohol Use: Yes     occ    Allergies:  Allergies  Allergen Reactions  . Flagyl (Metronidazole Hcl) Hives and Nausea And Vomiting  . Latex     sensitive    Prescriptions prior to admission  Medication Sig Dispense Refill  . acetaminophen (TYLENOL) 500 MG tablet Take 500 mg by mouth daily as needed. For pain      . Prenatal Vit-Fe Fumarate-FA (PRENATAL MULTIVITAMIN) TABS Take 1 tablet by mouth daily.      . promethazine (PHENERGAN) 25 MG tablet Take 25 mg by mouth every 6 (six) hours as needed. Take 1/2 tablet for milder symptoms.  Sedation precautions.  As of 08/13/11 pt has  Not started this medication yet.      Marland Kitchen DISCONTD: promethazine (PHENERGAN) 25 MG tablet Take 1 tablet (25 mg total) by mouth every 6 (six) hours as needed for nausea. Take 1/2 tablet for milder symptoms.  Sedation precautions.  30 tablet  0    Review of Systems  Constitutional: Negative for fever and chills.  Eyes: Negative for  blurred vision.  Cardiovascular: Negative for chest pain.  Gastrointestinal: Negative for nausea, vomiting, abdominal pain, diarrhea and constipation.  Genitourinary: Negative for dysuria, urgency, frequency and hematuria.  Neurological: Negative for dizziness and headaches.  Psychiatric/Behavioral: Negative for depression and suicidal ideas.   Physical Exam   Blood pressure 120/73, pulse 86, temperature 98.8 F (37.1 C), temperature source Oral, resp. rate 16, last menstrual period 07/11/2011, SpO2 99.00%, unknown if currently breastfeeding.  Physical Exam  Nursing note and vitals reviewed. Constitutional: She is oriented to person, place, and time. She appears well-developed and well-nourished. No distress.  HENT:  Head: Normocephalic and atraumatic.  Eyes: EOM are normal. Pupils are equal, round, and reactive to light.  GI: Soft. She exhibits no distension. There is no tenderness. There is no rebound and no guarding.  Neurological: She is alert and oriented to person, place, and time.  Skin: Skin is warm and dry. She is not diaphoretic.  Psychiatric: She has a normal mood and affect. Her behavior is normal. Judgment and thought content normal.    MAU Course  Procedures  Results for orders placed during the hospital encounter of 08/13/11 (from the past 72 hour(s))  HCG, QUANTITATIVE, PREGNANCY     Status: Abnormal   Collection Time   08/13/11  8:00 AM      Component Value Range Comment   hCG, Beta Chain, Quant, S 156 (*) <5 (mIU/mL)      Assessment and Plan  Pregnancy: discussed with pt at length. She has had an appropriate rise in her B-quant. She will return in 1wk for repeat B-quant or prn. Discussed diet, activity, risks, and precautions. Discussed signs and sx of ectopic pregnancy.  Clinton Gallant. Jadee Golebiewski III, DrHSc, MPAS, PA-C  08/13/2011, 8:47 AM   Henrietta Hoover, PA 08/13/11 [phone]

## 2011-08-13 NOTE — Progress Notes (Signed)
Patient to MAU for repeat BHCG . Patient denies any bleeding but did have a spot of brown blood at 0300.

## 2011-08-13 NOTE — ED Provider Notes (Signed)
Attestation of Attending Supervision of Advanced Practitioner: Evaluation and management procedures were performed by the PA/NP/CNM/OB Fellow under my supervision/collaboration. Chart reviewed, and agree with management and plan.  Maanasa Aderhold, M.D. 08/13/2011 4:11 PM   

## 2011-08-17 ENCOUNTER — Inpatient Hospital Stay (HOSPITAL_COMMUNITY)
Admission: AD | Admit: 2011-08-17 | Discharge: 2011-08-17 | Disposition: A | Discharge status: Home / Self Care | Payer: Self-pay | Source: Ambulatory Visit | Attending: Obstetrics & Gynecology | Admitting: Obstetrics & Gynecology

## 2011-08-17 ENCOUNTER — Inpatient Hospital Stay (HOSPITAL_COMMUNITY): Payer: Self-pay

## 2011-08-17 ENCOUNTER — Encounter (HOSPITAL_COMMUNITY): Payer: Self-pay | Admitting: *Deleted

## 2011-08-17 DIAGNOSIS — O00109 Unspecified tubal pregnancy without intrauterine pregnancy: Secondary | ICD-10-CM | POA: Insufficient documentation

## 2011-08-17 DIAGNOSIS — Z09 Encounter for follow-up examination after completed treatment for conditions other than malignant neoplasm: Secondary | ICD-10-CM

## 2011-08-17 DIAGNOSIS — N9489 Other specified conditions associated with female genital organs and menstrual cycle: Secondary | ICD-10-CM | POA: Insufficient documentation

## 2011-08-17 LAB — HCG, QUANTITATIVE, PREGNANCY: hCG, Beta Chain, Quant, S: 34 m[IU]/mL — ABNORMAL HIGH (ref <5)

## 2011-08-17 IMAGING — US US OB TRANSVAGINAL
1 series · 13 of 27 positions shown · non-contrast
Comparison: [DATE]

CLINICAL DATA: Spotting.  Estimated gestational age by LMP is 5
weeks 2 days.  Declining quantitative beta HCG of 34.

TRANSVAGINAL OB ULTRASOUND
TECHNIQUE: Transvaginal ultrasound was performed for evaluation of
the gestation as well as the maternal uterus and adnexal regions.

[Series 1: us ob transvaginal · 27 acquisitions, 13 frames shown]
[im 2/27]
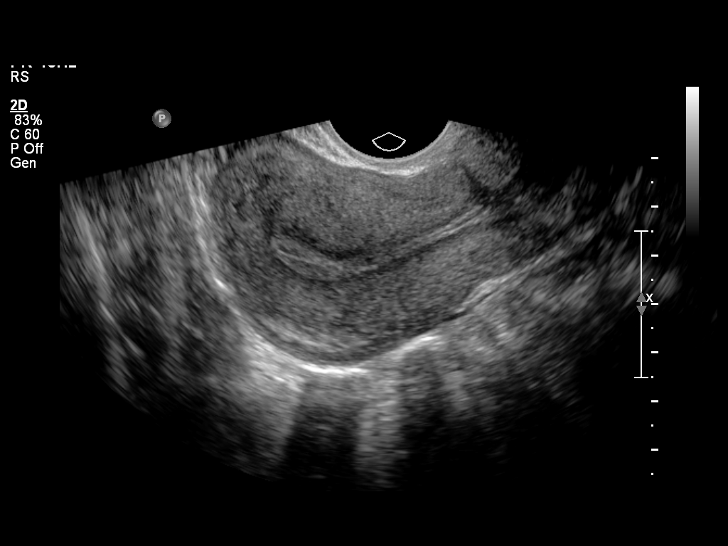
[im 4/27]
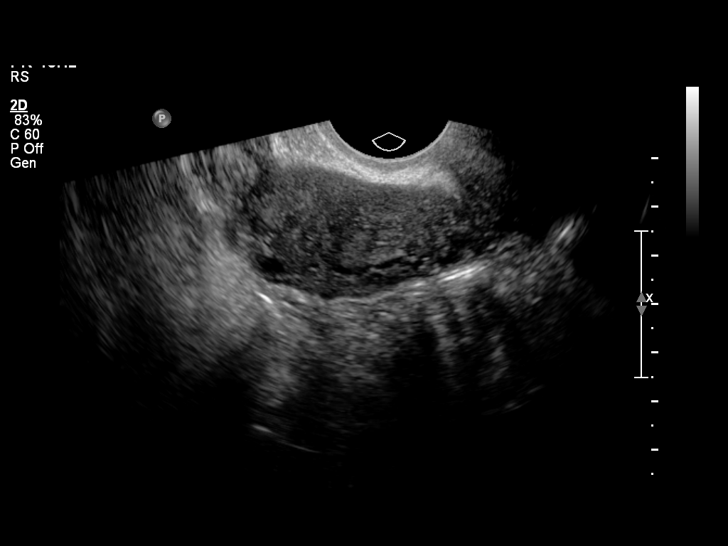
[im 6/27]
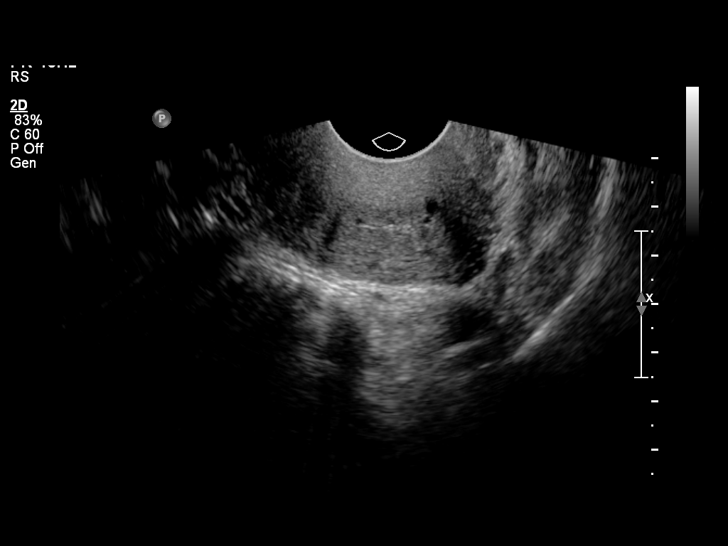
[im 8/27]
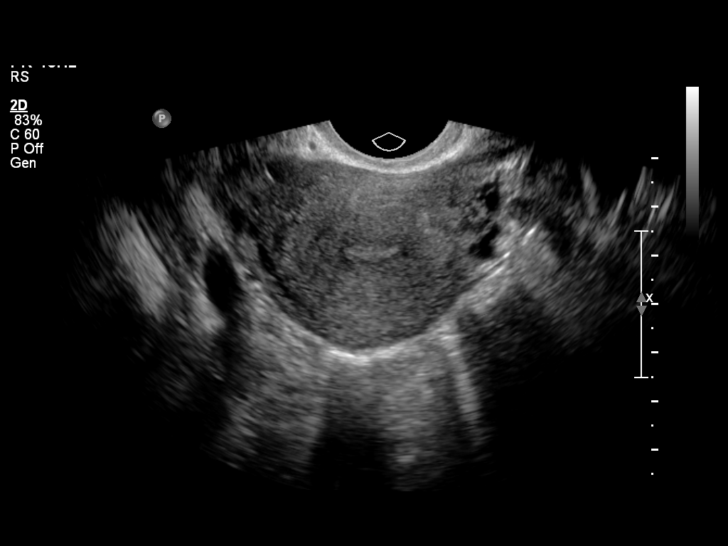
[im 10/27]
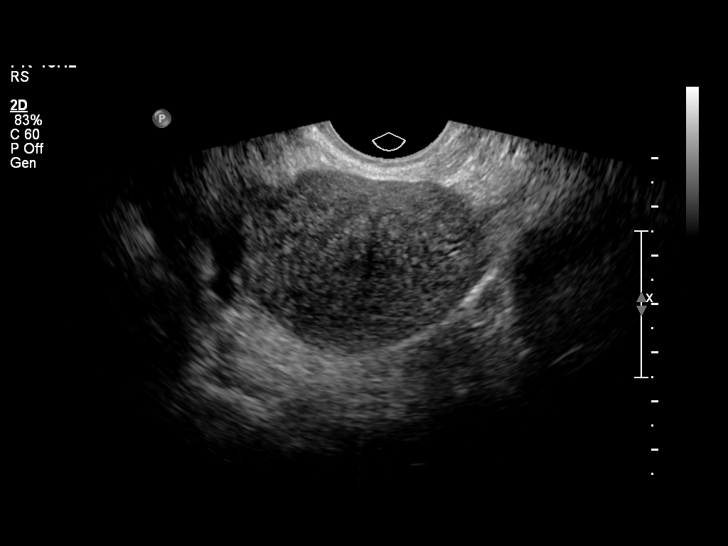
[im 12/27]
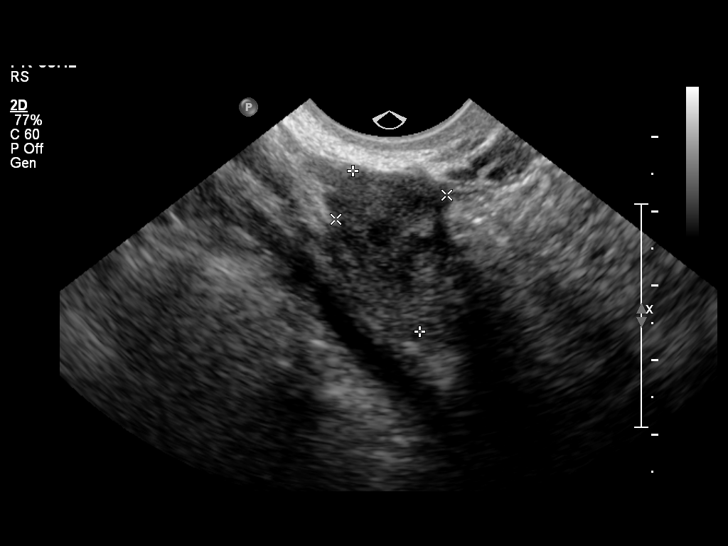
[im 14/27]
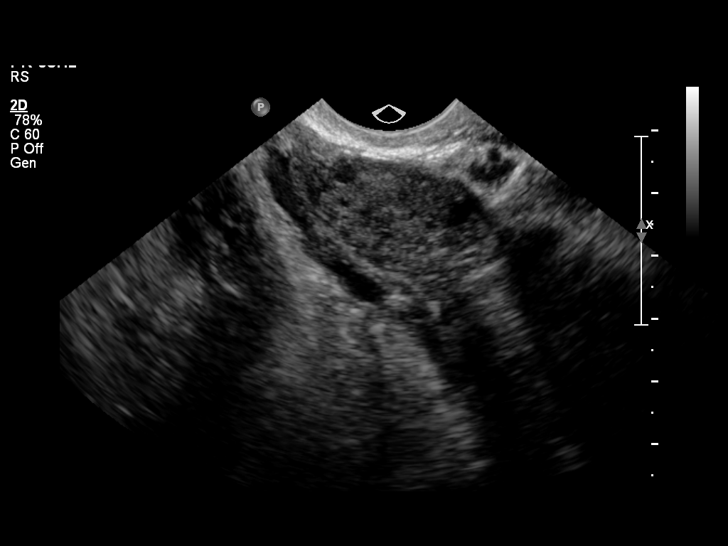
[im 16/27]
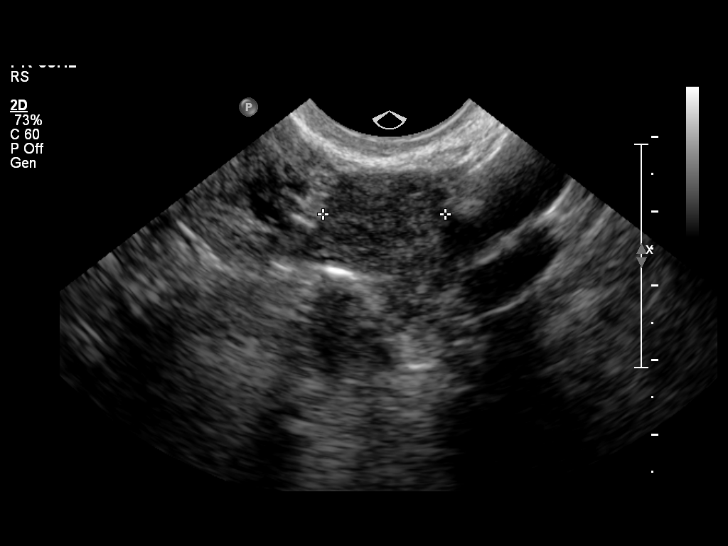
[im 18/27]
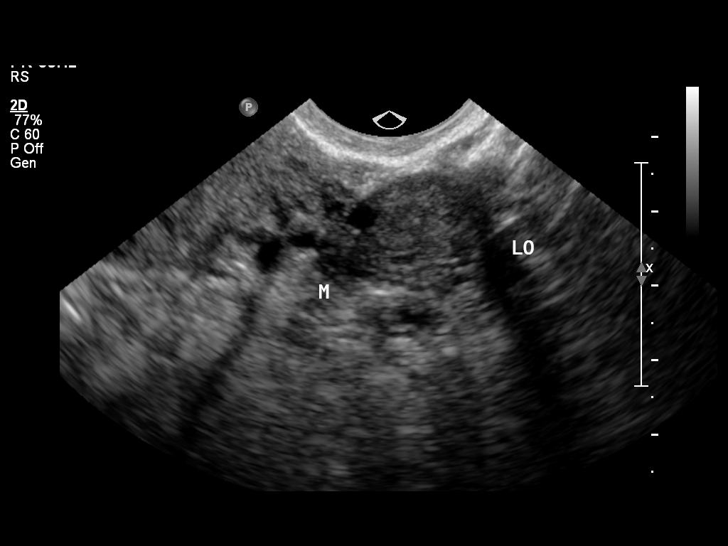
[im 20/27]
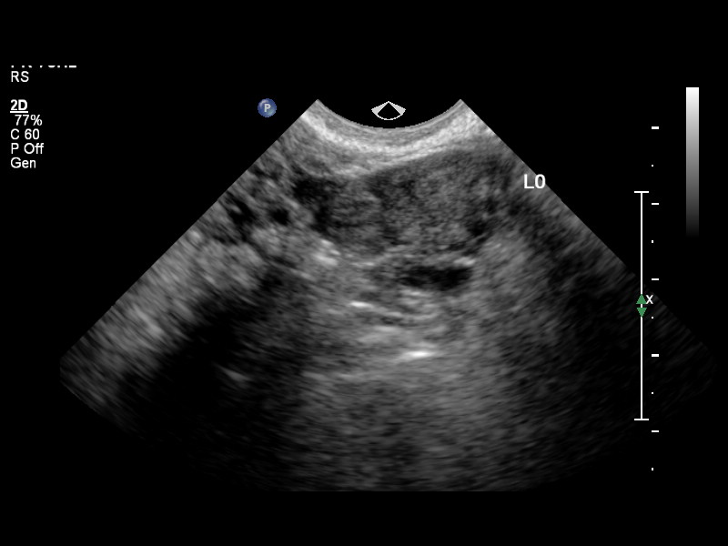
[im 22/27]
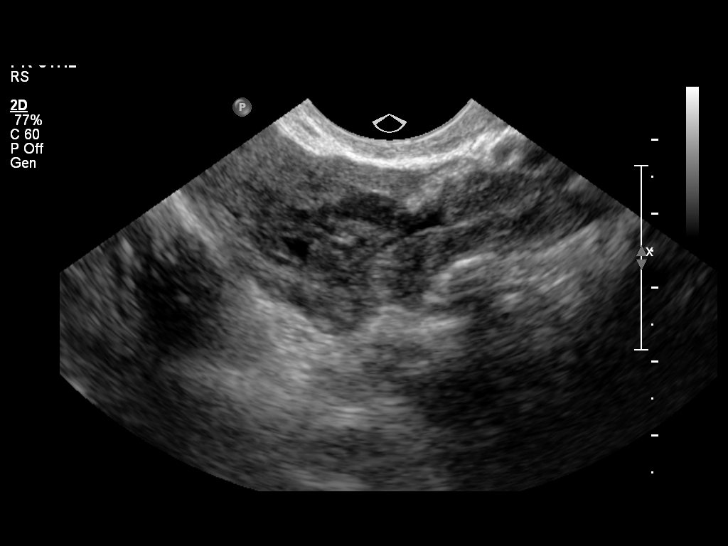
[im 24/27]
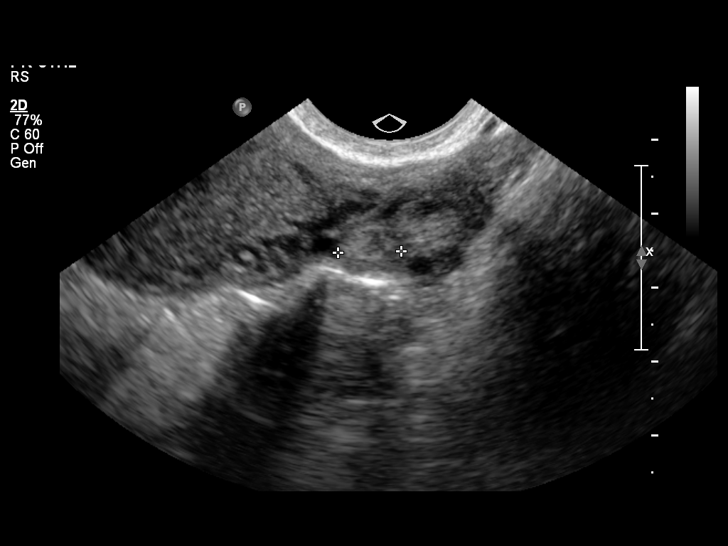
[im 26/27]
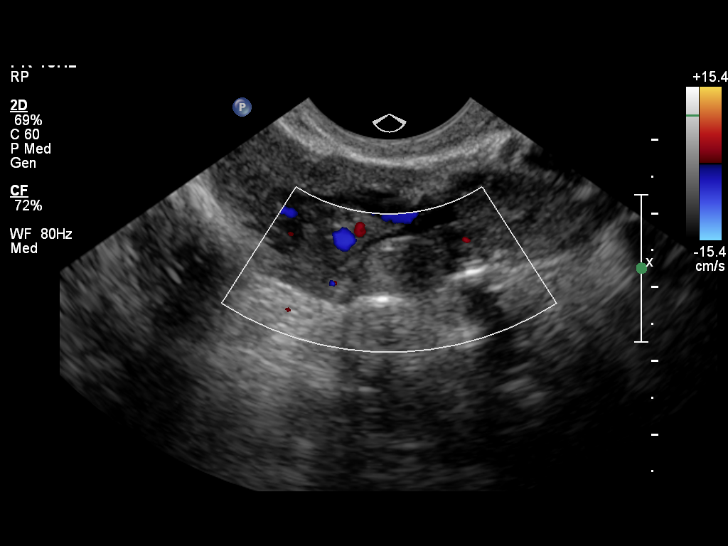

[13 of 27 positions shown; findings below may reference images not displayed]

FINDINGS: No intrauterine pregnancy is demonstrated.  Endometrial
stripe thickness is normal, measuring about 8 mm double wall.  No
abnormal endometrial fluid collections.  Small Nabothian cysts in
the cervix.  Myometrium is homogeneous without focal mass lesions.
No free pelvic fluid collections.  The right ovary measures 1.6 x
1.5 x 2.3 cm and is normal follicular changes.  Flow is
demonstrated within the right and left ovary on color flow Doppler
imaging.  The left ovary measures 1.6 x 1.8 x 2.7 cm.  In the left
adnexa, there is a small solid appearing nodular lesion measuring
about 9 mm in diameter.  Minimal flow is demonstrated in this area
on color flow Doppler imaging.  This may represent a small left
adnexal ectopic pregnancy. This was not demonstrated on the
previous study.  No fetal pole or cardiac activity are visualized.
IMPRESSION: Solid appearing 9 mm lesion in the left adnexal region which was
not visualized previously.  This may represent a small left adnexal
ectopic pregnancy.  No intrauterine pregnancy is visualized.

Results were telephoned to [place] at the time of dictation, [9A]
hours on [DATE].

## 2011-08-17 NOTE — Progress Notes (Signed)
G6P2 at 5wks 2days. Seen last Sunday MAU due to cramping and nausea. BHCG 71. Repeat BHCG 156 on Weds. Saw some spotting that day which has come and gone all wk. THis am at 0100 awoke and had small amt bright blood which continues

## 2011-08-17 NOTE — ED Notes (Signed)
[address] CNM in to see pt

## 2011-08-17 NOTE — Progress Notes (Signed)
Threasa Heads CNM in to see pt. Spec exam done and no specimens obtained.

## 2011-08-17 NOTE — ED Provider Notes (Signed)
Attestation of Attending Supervision of Advanced Practitioner: Evaluation and management procedures were performed by the PA/NP/CNM/OB Fellow under my supervision/collaboration. Chart reviewed, and agree with management and plan.  Aaron Boeh, M.D. 08/17/2011 7:36 AM   

## 2011-08-17 NOTE — Progress Notes (Signed)
Threasa Heads CNM in to discuss u/s results and d/c plan and f/u plan with pt. Understanding voiced.

## 2011-08-17 NOTE — ED Provider Notes (Signed)
History     Chief Complaint  Patient presents with  . Vaginal Bleeding   HPI  G6P2 at 5wks 2 days seen last Sunday in MAU due to cramping and nausea, BHCG 71. Repeat BHCG 156 on Weds. Ultrasound showed a small ? Gestational sac with recommendation for repeat in 10 days.  Reports intermittent spotting for past week.   Woke up at 0100 to small amt bright blood.   Past Medical History  Diagnosis Date  . Abnormal Pap smear     repeat WNL  . No pertinent past medical history   . Chlamydia     Past Surgical History  Procedure Date  . Cesarean section   . Wisdom tooth extraction 2004  . Tooth extraction     Family History  Problem Relation Age of Onset  . Hypertension Maternal Grandmother   . Stroke Maternal Grandmother   . Cancer Paternal Grandmother     brain, lung  . Diabetes Paternal Grandmother   . Heart disease Paternal Grandmother   . Hypertension Paternal Grandmother   . Anesthesia problems Neg Hx   . Hypotension Neg Hx   . Malignant hyperthermia Neg Hx   . Pseudochol deficiency Neg Hx     History  Substance Use Topics  . Smoking status: Never Smoker   . Smokeless tobacco: Never Used  . Alcohol Use: Yes     occ    Allergies:  Allergies  Allergen Reactions  . Flagyl (Metronidazole Hcl) Hives and Nausea And Vomiting  . Latex     sensitive    Prescriptions prior to admission  Medication Sig Dispense Refill  . acetaminophen (TYLENOL) 500 MG tablet Take 500 mg by mouth daily as needed. For pain      . Prenatal Vit-Fe Fumarate-FA (PRENATAL MULTIVITAMIN) TABS Take 1 tablet by mouth daily.      . promethazine (PHENERGAN) 25 MG tablet Take 25 mg by mouth every 6 (six) hours as needed. Take 1/2 tablet for milder symptoms.  Sedation precautions.  As of 08/13/11 pt has  Not started this medication yet.        Review of Systems  Gastrointestinal: Positive for abdominal pain ( cramping).  Genitourinary:       Vaginal bleeding  All other systems reviewed and are  negative.   Physical Exam   Blood pressure 133/77, pulse 117, temperature 98.8 F (37.1 C), temperature source Oral, resp. rate 20, height 5\' 3"  (1.6 m), weight 54.885 kg (121 lb), last menstrual period 07/11/2011, SpO2 97.00%, unknown if currently breastfeeding.  Physical Exam  Constitutional: She is oriented to person, place, and time. She appears well-developed and well-nourished. No distress.  HENT:  Head: Normocephalic.  Neck: Normal range of motion. Neck supple.  Cardiovascular: Normal rate, regular rhythm and normal heart sounds.  Exam reveals no gallop and no friction rub.   No murmur heard. Respiratory: Effort normal and breath sounds normal. No respiratory distress.  GI: Soft. She exhibits no mass. There is tenderness (suprapubic). There is no rebound and no guarding.  Genitourinary: There is bleeding (moderate; negative clots) around the vagina.  Neurological: She is alert and oriented to person, place, and time.  Skin: Skin is warm and dry.    MAU Course  Procedures Results for orders placed during the hospital encounter of 08/17/11 (from the past 24 hour(s))  HCG, QUANTITATIVE, PREGNANCY     Status: Abnormal   Collection Time   08/17/11  5:27 AM      Component Value  Range   hCG, Beta Chain, Quant, S 34 (*) <5 (mIU/mL)     IMPRESSION:  Solid appearing 9 mm lesion in the left adnexal region which was  not visualized previously. This may represent a small left adnexal  ectopic pregnancy. No intrauterine pregnancy is visualized.  Consulted with Dr. Shirline Frees HPI/exam/US>offer MTX or return for repeat BHCG in 48 hrs since BHCG is dropping  Assessment and Plan  Left Adnexal Mass - Stable  Plan: DC to home Ectopic precautions F/U in 48 hours for repeat BHCG  [facility] 08/17/2011, 5:24 AM

## 2011-08-17 NOTE — Progress Notes (Signed)
Written and verbal d/c instructions given and understanding voiced. To return Tues (gets out of class at 1515). To return for pain in lower abd, increased bleeding, light-headed &/or fainting. Understanding vocied.

## 2011-08-19 ENCOUNTER — Inpatient Hospital Stay (HOSPITAL_COMMUNITY)
Admission: AD | Admit: 2011-08-19 | Discharge: 2011-08-19 | Disposition: A | Discharge status: Home / Self Care | Payer: Self-pay | Source: Ambulatory Visit | Attending: Obstetrics and Gynecology | Admitting: Obstetrics and Gynecology

## 2011-08-19 ENCOUNTER — Encounter (HOSPITAL_COMMUNITY): Payer: Self-pay

## 2011-08-19 DIAGNOSIS — O009 Unspecified ectopic pregnancy without intrauterine pregnancy: Secondary | ICD-10-CM

## 2011-08-19 DIAGNOSIS — O00109 Unspecified tubal pregnancy without intrauterine pregnancy: Secondary | ICD-10-CM | POA: Insufficient documentation

## 2011-08-19 HISTORY — DX: Unspecified ovarian cyst, unspecified side: N83.209

## 2011-08-19 HISTORY — DX: Urinary tract infection, site not specified: N39.0

## 2011-08-19 HISTORY — DX: Carrier or suspected carrier of methicillin resistant Staphylococcus aureus: Z22.322

## 2011-08-19 HISTORY — DX: Unspecified fracture of shaft of unspecified fibula, initial encounter for closed fracture: S82.409A

## 2011-08-19 LAB — DIFFERENTIAL
Basophils Absolute: 0 10*3/uL (ref 0.0–0.1)
Basophils Relative: 0 % (ref 0–1)
Eosinophils Absolute: 0.1 10*3/uL (ref 0.0–0.7)
Eosinophils Relative: 0 % (ref 0–5)
Lymphocytes Relative: 18 % (ref 12–46)
Lymphs Abs: 2 10*3/uL (ref 0.7–4.0)
Monocytes Absolute: 0.9 10*3/uL (ref 0.1–1.0)
Monocytes Relative: 8 % (ref 3–12)
Neutro Abs: 8.3 10*3/uL — ABNORMAL HIGH (ref 1.7–7.7)
Neutrophils Relative %: 74 % (ref 43–77)

## 2011-08-19 LAB — CBC
HCT: 38.8 % (ref 36.0–46.0)
Hemoglobin: 12.5 g/dL (ref 12.0–15.0)
MCH: 31.2 pg (ref 26.0–34.0)
MCHC: 32.2 g/dL (ref 30.0–36.0)
MCV: 96.8 fL (ref 78.0–100.0)
Platelets: 305 10*3/uL (ref 150–400)
RBC: 4.01 MIL/uL (ref 3.87–5.11)
RDW: 12.9 % (ref 11.5–15.5)
WBC: 11.2 10*3/uL — ABNORMAL HIGH (ref 4.0–10.5)

## 2011-08-19 LAB — CREATININE, SERUM
Creatinine, Ser: 0.77 mg/dL (ref 0.50–1.10)
GFR calc Af Amer: >90 mL/min (ref >90)
GFR calc non Af Amer: >90 mL/min (ref >90)

## 2011-08-19 LAB — AST: AST: 12 U/L (ref 0–37)

## 2011-08-19 LAB — BUN: BUN: 8 mg/dL (ref 6–23)

## 2011-08-19 LAB — HCG, QUANTITATIVE, PREGNANCY: hCG, Beta Chain, Quant, S: 32 m[IU]/mL — ABNORMAL HIGH (ref <5)

## 2011-08-19 MED ORDER — METHOTREXATE INJECTION FOR WOMEN'S HOSPITAL
50.0000 mg/m2 | Freq: Once | INTRAMUSCULAR | Status: AC
  Administered 2011-08-19: 80 mg via INTRAMUSCULAR
  Filled 2011-08-19: qty 1.6

## 2011-08-19 NOTE — ED Notes (Signed)
?  boil started spontaneously draining when pulled back buttock to assess.  Draining pus and dk blood, some odor.  Pt reports hx of MRSA in urine.

## 2011-08-19 NOTE — ED Provider Notes (Signed)
Agree with above note.  Andrea Dean 08/19/2011 4:56 PM   

## 2011-08-19 NOTE — Progress Notes (Signed)
Came in on Sunday with cramping and bleeding, knew she was preg.     This is her 4th visit.  Her BHCG level on Sunday had dropped from 156 to 34.  Bleeding has continues, is getting lighter, never passed any clots.  Noted ? Boil or ingrown hair on left buttock last wk.  Has been doing soaks and applying heat to area.

## 2011-08-19 NOTE — ED Provider Notes (Signed)
History     Chief Complaint  Patient presents with  . Ectopic Pregnancy   HPI Andrea Dean is 26 y.o. [phone] [redacted]w[redacted]d weeks presents for follow up BHCG.  She was originally seen 1/20 at [redacted]w[redacted]d with nausea and concern for miscarriage.  BHCG 71, u/s no evidence of IUP, no YS, nl ovaries.  She returned 1/23 with no complaints--BHCG 156.  Repeat BHCG on 1/27 reported intermittent bleeding==BHCG 34, u/s showed a solid 9mm lesion left adnexal not previously seen.  MTX was offered but declined by the patient, fear of "chemotherapy and I wanted to pass it on my own".  Blood type, previous record is O positive. Today bleeding continues light flow.  Left sided and left back pain.  Has a boil on her left buttocks that has popped.  Hx of MRSA in urine 2011.   Past Medical History  Diagnosis Date  . Abnormal Pap smear     repeat WNL  . No pertinent past medical history   . Chlamydia     Past Surgical History  Procedure Date  . Cesarean section   . Wisdom tooth extraction 2004  . Tooth extraction     Family History  Problem Relation Age of Onset  . Hypertension Maternal Grandmother   . Stroke Maternal Grandmother   . Cancer Paternal Grandmother     brain, lung  . Diabetes Paternal Grandmother   . Heart disease Paternal Grandmother   . Hypertension Paternal Grandmother   . Anesthesia problems Neg Hx   . Hypotension Neg Hx   . Malignant hyperthermia Neg Hx   . Pseudochol deficiency Neg Hx     History  Substance Use Topics  . Smoking status: Never Smoker   . Smokeless tobacco: Never Used  . Alcohol Use: Yes     occ    Allergies:  Allergies  Allergen Reactions  . Flagyl (Metronidazole Hcl) Hives and Nausea And Vomiting  . Latex     sensitive     (Not in a hospital admission)  Review of Systems  Constitutional: Negative.   HENT: Negative.   Respiratory: Negative.   Cardiovascular: Negative.   Gastrointestinal: Positive for abdominal pain (left lower abdomen).  Genitourinary:        Light vaginal bleeding  Musculoskeletal: Positive for back pain (left lower back).  Psychiatric/Behavioral: The patient is nervous/anxious (states she is nervous ).    Physical Exam   Last menstrual period 07/11/2011, unknown if currently breastfeeding.  Physical Exam  Constitutional: She is oriented to person, place, and time. She appears well-developed and well-nourished. No distress.       Anxious   HENT:  Head: Normocephalic.  Cardiovascular: Normal rate.   Respiratory: Effort normal.  GI: Soft. There is tenderness (left lower abdominal tenderness ).  Genitourinary: Uterus is not tender. Right adnexum displays no tenderness. Left adnexum displays tenderness (mild). There is bleeding around the vagina.       Moderate amount of bright red blood with 1 clot.   Neurological: She is alert and oriented to person, place, and time.  Skin: Skin is warm and dry.      Results for orders placed during the hospital encounter of 08/19/11 (from the past 24 hour(s))  HCG, QUANTITATIVE, PREGNANCY     Status: Abnormal   Collection Time   08/19/11  7:57 AM      Component Value Range   hCG, Beta Chain, Quant, S 32 (*) <5 (mIU/mL)   MAU Course  Procedures  MDM Because of patient's fear of MTX, Dr. Jolayne Panther in to discuss findings with the patient and the plan of care.  She did agree to take MTX.  Risks and benefits were explained in detail to the patient as well as necessary followup.   Assessment and Plan  A:  Ectopic pregnancy with MTX  P:  Patient will return on Day 4 for repeat BHCG.  Will return if increased pain and heavy bleeding prior to that date.  [name],[name] 08/19/2011, 8:11 AM   [name], NP 08/19/11 1539

## 2011-08-19 NOTE — Progress Notes (Signed)
Pt here for repeat bhcg, told she has left ectopic pregnancy. Has left sided pain/pulling, dull cramp noted with different positions. Vaginal bleeding scant amount. Also notes ? Ingrown hair on her bottom. Denies pain/redness/fever.

## 2011-08-22 ENCOUNTER — Encounter (HOSPITAL_COMMUNITY): Payer: Self-pay | Admitting: *Deleted

## 2011-08-22 ENCOUNTER — Inpatient Hospital Stay (HOSPITAL_COMMUNITY)
Admission: AD | Admit: 2011-08-22 | Discharge: 2011-08-22 | Disposition: A | Discharge status: Home / Self Care | Payer: Self-pay | Source: Ambulatory Visit | Attending: Obstetrics & Gynecology | Admitting: Obstetrics & Gynecology

## 2011-08-22 DIAGNOSIS — Z09 Encounter for follow-up examination after completed treatment for conditions other than malignant neoplasm: Secondary | ICD-10-CM

## 2011-08-22 DIAGNOSIS — O00109 Unspecified tubal pregnancy without intrauterine pregnancy: Secondary | ICD-10-CM | POA: Insufficient documentation

## 2011-08-22 LAB — HCG, QUANTITATIVE, PREGNANCY: hCG, Beta Chain, Quant, S: 23 m[IU]/mL — ABNORMAL HIGH (ref <5)

## 2011-08-22 NOTE — ED Provider Notes (Signed)
History     CSN: [id number]  Arrival date & time 08/22/11  4098   None     No chief complaint on file.   HPI Andrea Dean is a 26 y.o. female @ [redacted]w[redacted]d gestation who returns to MAU for repeat Bhcg. She was seen initially on 08/10/11 and at that time her Bhcg wa 71. She returned and it increased to 156. On 1/29 she had an ultrasound that showed no IUP and a left adnexal mass. She opted to get MTX. and the Bhcg was 32 and today (day 4 s/p MTX) it is 23. She has less cramping and rates her pain 2/10.   Past Medical History  Diagnosis Date  . Abnormal Pap smear     repeat WNL  . Chlamydia   . Preterm labor   . Urinary tract infection   . MRSA (methicillin resistant staph aureus) culture positive   . Fracture closed, fibula, shaft 2007    left, no surgical repair  . Ovarian cyst     Past Surgical History  Procedure Date  . Cesarean section   . Wisdom tooth extraction 2004  . Tooth extraction     Family History  Problem Relation Age of Onset  . Hypertension Maternal Grandmother   . Stroke Maternal Grandmother   . Cancer Paternal Grandmother     brain, lung  . Diabetes Paternal Grandmother   . Heart disease Paternal Grandmother   . Hypertension Paternal Grandmother   . Anesthesia problems Neg Hx   . Hypotension Neg Hx   . Malignant hyperthermia Neg Hx   . Pseudochol deficiency Neg Hx     History  Substance Use Topics  . Smoking status: Never Smoker   . Smokeless tobacco: Never Used  . Alcohol Use: Yes     occ    OB History    Grav Para Term Preterm Abortions TAB SAB Ect Mult Living   6 2 2  3 2 1   2       Review of Systems  Constitutional: Negative for fever and chills.  Gastrointestinal: Abdominal pain: mild cramping.  Genitourinary: Vaginal bleeding: spotting.    Allergies  Flagyl and Latex  Home Medications  No current outpatient prescriptions on file.  BP 113/71  Pulse 72  Temp(Src) 98.8 F (37.1 C) (Oral)  Resp 18  LMP 07/11/2011  Physical  Exam  Constitutional: She appears well-developed and well-nourished. No distress.  HENT:  Head: Normocephalic.  Neck: Neck supple.  Cardiovascular: Normal rate.   Pulmonary/Chest: Effort normal.  Musculoskeletal: Normal range of motion.  Psychiatric: She has a normal mood and affect. Her behavior is normal. Judgment and thought content normal.    ED Course  Procedures  Labs Reviewed  HCG, QUANTITATIVE, PREGNANCY - Abnormal; Notable for the following:    hCG, Beta Chain, Quant, S 23 (*)    All other components within normal limits   Assessment: Follow up s/p MTX  Plan:  Return day 7 for follow up Bhcg   Continue strict ectopic precautions MDM          Kerrie Buffalo, NP 08/22/11 [phone]

## 2011-08-22 NOTE — Progress Notes (Signed)
Received methotrexate on Tues.  occ twinges on left side.  Minimal bleeding, never really passed anything.

## 2011-08-22 NOTE — ED Provider Notes (Signed)
Attestation of Attending Supervision of Advanced Practitioner: Evaluation and management procedures were performed by the PA/NP/CNM/OB Fellow under my supervision/collaboration. Chart reviewed, and agree with management and plan.  Jaynie Collins, M.D. 08/22/2011 10:31 AM

## 2011-08-26 ENCOUNTER — Inpatient Hospital Stay (HOSPITAL_COMMUNITY)
Admission: AD | Admit: 2011-08-26 | Discharge: 2011-08-26 | Disposition: A | Discharge status: Home / Self Care | Payer: Self-pay | Source: Ambulatory Visit | Attending: Obstetrics & Gynecology | Admitting: Obstetrics & Gynecology

## 2011-08-26 DIAGNOSIS — O00109 Unspecified tubal pregnancy without intrauterine pregnancy: Secondary | ICD-10-CM | POA: Insufficient documentation

## 2011-08-26 DIAGNOSIS — O009 Unspecified ectopic pregnancy without intrauterine pregnancy: Secondary | ICD-10-CM

## 2011-08-26 LAB — HCG, QUANTITATIVE, PREGNANCY: hCG, Beta Chain, Quant, S: 15 m[IU]/mL — ABNORMAL HIGH (ref <5)

## 2011-08-26 MED ORDER — FLUCONAZOLE 150 MG PO TABS: 150.0000 mg | ORAL_TABLET | Freq: Once | ORAL | Status: AC

## 2011-08-26 NOTE — ED Provider Notes (Signed)
Attestation of Attending Supervision of Advanced Practitioner: Evaluation and management procedures were performed by the PA/NP/CNM/OB Fellow under my supervision/collaboration. Chart reviewed, and agree with management and plan.  Zayven Powe, M.D. 08/26/2011 11:26 AM   

## 2011-08-26 NOTE — ED Provider Notes (Signed)
History   Pt presents today for repeat B-quant on day 7 s/p methotrexate for ectopic preg. She states she is doing well and is now only having occ pain. She denies vag bleeding, fever, or any other sx at this time.  Chief Complaint  Patient presents with  . Follow-up   HPI  OB History    Grav Para Term Preterm Abortions TAB SAB Ect Mult Living   6 2 2  3 2 1   2       Past Medical History  Diagnosis Date  . Abnormal Pap smear     repeat WNL  . Chlamydia   . Preterm labor   . Urinary tract infection   . MRSA (methicillin resistant staph aureus) culture positive   . Fracture closed, fibula, shaft 2007    left, no surgical repair  . Ovarian cyst     Past Surgical History  Procedure Date  . Cesarean section   . Wisdom tooth extraction 2004  . Tooth extraction     Family History  Problem Relation Age of Onset  . Hypertension Maternal Grandmother   . Stroke Maternal Grandmother   . Cancer Paternal Grandmother     brain, lung  . Diabetes Paternal Grandmother   . Heart disease Paternal Grandmother   . Hypertension Paternal Grandmother   . Anesthesia problems Neg Hx   . Hypotension Neg Hx   . Malignant hyperthermia Neg Hx   . Pseudochol deficiency Neg Hx     History  Substance Use Topics  . Smoking status: Never Smoker   . Smokeless tobacco: Never Used  . Alcohol Use: Yes     occ    Allergies:  Allergies  Allergen Reactions  . Flagyl (Metronidazole Hcl) Hives and Nausea And Vomiting  . Latex     sensitive    No prescriptions prior to admission    Review of Systems  Constitutional: Negative for fever.  Eyes: Negative for blurred vision and double vision.  Cardiovascular: Negative for chest pain and palpitations.  Gastrointestinal: Negative for nausea, vomiting, abdominal pain, diarrhea and constipation.  Genitourinary: Negative for dysuria, urgency, frequency and hematuria.  Neurological: Negative for dizziness and headaches.  Psychiatric/Behavioral:  Negative for depression and suicidal ideas.   Physical Exam   Blood pressure 116/67, pulse 69, temperature 98.3 F (36.8 C), temperature source Oral, resp. rate 16, last menstrual period 07/11/2011, SpO2 99.00%.  Physical Exam  Nursing note and vitals reviewed. Constitutional: She is oriented to person, place, and time. She appears well-developed and well-nourished. No distress.  HENT:  Head: Normocephalic and atraumatic.  Eyes: EOM are normal. Pupils are equal, round, and reactive to light.  GI: Soft. She exhibits no distension and no mass. There is no tenderness. There is no rebound and no guarding.  Neurological: She is alert and oriented to person, place, and time.  Skin: Skin is warm and dry. She is not diaphoretic.  Psychiatric: She has a normal mood and affect. Her behavior is normal. Judgment and thought content normal.    MAU Course  Procedures  Results for orders placed during the hospital encounter of 08/26/11 (from the past 24 hour(s))  HCG, QUANTITATIVE, PREGNANCY     Status: Abnormal   Collection Time   08/26/11  7:43 AM      Component Value Range   hCG, Beta Chain, Quant, S 15 (*) <5 (mIU/mL)     Assessment and Plan  Ectopic preg: discussed with pt at length. She has had a  50% drop in her B-quant. She will return in 1wk for repeat B-quant. Discussed diet, activity, risks, and precautions. Pt also c/o yeast infection. Gave Rx for diflucan.  Clinton Gallant. Marielle Mantione III, DrHSc, MPAS, PA-C  08/26/2011, 9:41 AM   Henrietta Hoover, PA 08/26/11 [phone]

## 2011-08-26 NOTE — Progress Notes (Signed)
Patient to MAU for day 7 BHCG s/p MTX. Patient denies any bleeding but does have an occasional pain on left side.

## 2011-09-02 ENCOUNTER — Inpatient Hospital Stay (HOSPITAL_COMMUNITY)
Admission: AD | Admit: 2011-09-02 | Discharge: 2011-09-02 | Disposition: A | Discharge status: Home / Self Care | Payer: Self-pay | Source: Ambulatory Visit | Attending: Obstetrics and Gynecology | Admitting: Obstetrics and Gynecology

## 2011-09-02 ENCOUNTER — Encounter (HOSPITAL_COMMUNITY): Payer: Self-pay

## 2011-09-02 DIAGNOSIS — Z09 Encounter for follow-up examination after completed treatment for conditions other than malignant neoplasm: Secondary | ICD-10-CM

## 2011-09-02 DIAGNOSIS — O00109 Unspecified tubal pregnancy without intrauterine pregnancy: Secondary | ICD-10-CM | POA: Insufficient documentation

## 2011-09-02 LAB — HCG, QUANTITATIVE, PREGNANCY: hCG, Beta Chain, Quant, S: 13 m[IU]/mL — ABNORMAL HIGH (ref <5)

## 2011-09-02 NOTE — ED Provider Notes (Signed)
Andrea Dean is a 26 y.o. female who presents to MAU for follow up Bhcg s/p MTX. She is returning weekly. She denies any pain or bleeding today. On her last visit 08/26/11 her Bhcg was 15 and today is 13. I discussed this minimal drop with Dr. Jolayne Panther and she feels that it is ok for the patient to return in one week for follow up but if there is no drop on the next visit it should be discussed with the attending and decide on plan of care.   Results for orders placed during the hospital encounter of 09/02/11 (from the past 24 hour(s))  HCG, QUANTITATIVE, PREGNANCY     Status: Abnormal   Collection Time   09/02/11  8:03 AM      Component Value Range   hCG, Beta Chain, Quant, S 13 (*) <5 (mIU/mL)    [place], NP 09/02/11 [phone]

## 2011-09-02 NOTE — ED Notes (Signed)
Pt notified of bhcg level at 13, prior bhcg level was 15. Pt verbalized understanding to return in 1 week. Ectopic precautions reviewed.

## 2011-09-02 NOTE — Progress Notes (Signed)
Pt here for f/u repeat bhcg, last bhcg level was 15, denies pain, notes spotting still.

## 2011-09-02 NOTE — ED Notes (Signed)
Okay per [facility], NP for pt to be called with test results. Pt denies pain. Pt voices understanding, cell number obtained.

## 2011-09-05 NOTE — ED Provider Notes (Signed)
Agree with above note.  Andrea Dean 09/05/2011 7:06 AM   

## 2011-09-09 ENCOUNTER — Inpatient Hospital Stay (HOSPITAL_COMMUNITY)
Admission: AD | Admit: 2011-09-09 | Discharge: 2011-09-09 | Disposition: A | Discharge status: Home / Self Care | Payer: Self-pay | Source: Ambulatory Visit | Attending: Obstetrics & Gynecology | Admitting: Obstetrics & Gynecology

## 2011-09-09 DIAGNOSIS — O00109 Unspecified tubal pregnancy without intrauterine pregnancy: Secondary | ICD-10-CM | POA: Insufficient documentation

## 2011-09-09 DIAGNOSIS — Z09 Encounter for follow-up examination after completed treatment for conditions other than malignant neoplasm: Secondary | ICD-10-CM

## 2011-09-09 LAB — HCG, QUANTITATIVE, PREGNANCY: hCG, Beta Chain, Quant, S: 4 m[IU]/mL (ref <5)

## 2011-09-09 NOTE — Progress Notes (Signed)
Patient to MAU for weekly  BHCG s/p MTX. Patient denies any pain but does continue to have some brown spotting.

## 2011-09-09 NOTE — ED Provider Notes (Signed)
Andrea Dean is a 25 y.o. female who presents to MAU for follow up Bhcg after MTX. She is on a weekly return. Last week the Bhcg was down to 13 and today is 4. Patient denies any pain but continues to have spotting. We will have her follow up in the GYN clinic in 2 to 4 weeks. She will return here as needed.  [place], [place] 09/09/11 [phone]

## 2011-10-08 ENCOUNTER — Encounter: Payer: Medicaid Other | Admitting: Obstetrics and Gynecology

## 2012-02-12 ENCOUNTER — Encounter (HOSPITAL_COMMUNITY): Payer: Self-pay | Admitting: *Deleted

## 2012-02-12 ENCOUNTER — Inpatient Hospital Stay (HOSPITAL_COMMUNITY)
Admission: AD | Admit: 2012-02-12 | Discharge: 2012-02-12 | Disposition: A | Discharge status: Home / Self Care | Payer: Self-pay | Source: Ambulatory Visit | Attending: Obstetrics & Gynecology | Admitting: Obstetrics & Gynecology

## 2012-02-12 DIAGNOSIS — O2390 Unspecified genitourinary tract infection in pregnancy, unspecified trimester: Secondary | ICD-10-CM | POA: Insufficient documentation

## 2012-02-12 DIAGNOSIS — N390 Urinary tract infection, site not specified: Secondary | ICD-10-CM | POA: Insufficient documentation

## 2012-02-12 LAB — URINALYSIS, ROUTINE W REFLEX MICROSCOPIC
Bilirubin Urine: NEGATIVE
Glucose, UA: NEGATIVE mg/dL
Ketones, ur: NEGATIVE mg/dL
Nitrite: NEGATIVE
Protein, ur: NEGATIVE mg/dL
Specific Gravity, Urine: 1.015 (ref 1.005–1.030)
Urobilinogen, UA: 0.2 mg/dL (ref 0.0–1.0)
pH: 6 (ref 5.0–8.0)

## 2012-02-12 LAB — URINE MICROSCOPIC-ADD ON

## 2012-02-12 MED ORDER — FLUCONAZOLE 150 MG PO TABS: 150.0000 mg | ORAL_TABLET | Freq: Once | ORAL | Status: AC

## 2012-02-12 MED ORDER — CIPROFLOXACIN HCL 500 MG PO TABS: 500.0000 mg | ORAL_TABLET | Freq: Two times a day (BID) | ORAL | Status: AC

## 2012-02-12 NOTE — MAU Provider Note (Signed)
Marthella Cristopher Peru 1914 @[redacted]w[redacted]d  by LMP Chief Complaint  Patient presents with  . Urinary Tract Infection   First Provider Initiated Contact with Patient 02/12/12 1051    SUBJECTIVE  HPI: S xof UTI x 3 days--frequency, dysuria. Denies fever, chills, flank pain. Has not tried anything for Sx.   Past Medical History  Diagnosis Date  . Abnormal Pap smear     repeat WNL  . Chlamydia   . Preterm labor   . Urinary tract infection   . MRSA (methicillin resistant staph aureus) culture positive   . Fracture closed, fibula, shaft 2007    left, no surgical repair  . Ovarian cyst    Past Surgical History  Procedure Date  . Cesarean section   . Wisdom tooth extraction 2004  . Tooth extraction    History   Social History  . Marital Status: Single    Spouse Name: N/A    Number of Children: N/A  . Years of Education: N/A   Occupational History  . Not on file.   Social History Main Topics  . Smoking status: Never Smoker   . Smokeless tobacco: Never Used  . Alcohol Use: Yes     occ  . Drug Use: No  . Sexually Active: Yes    Birth Control/ Protection: None   Other Topics Concern  . Not on file   Social History Narrative  . No narrative on file   No current facility-administered medications on file prior to encounter.   No current outpatient prescriptions on file prior to encounter.   Allergies  Allergen Reactions  . Flagyl (Metronidazole Hcl) Hives and Nausea And Vomiting  . Latex     sensitive   ROS: Pertinent items in HPI  OBJECTIVE Blood pressure 113/64, pulse 72, temperature 98.5 F (36.9 C), temperature source Oral, resp. rate 16, height 5' 3.5" (1.613 m), weight 52.98 kg (116 lb 12.8 oz), last menstrual period 02/06/2012, SpO2 100.00%.  GENERAL: Well-developed, well-nourished female in no acute distress.  HEENT: Normocephalic, good dentition HEART: normal rate RESP: normal effort ABDOMEN: Soft, non-tender. No CVAT EXTREMITIES: Nontender, no  edema NEURO: Alert and oriented SPECULUM EXAM: deferred  LAB RESULTS  Results for orders placed during the hospital encounter of 02/12/12 (from the past 24 hour(s))  URINALYSIS, ROUTINE W REFLEX MICROSCOPIC     Status: Abnormal   Collection Time   02/12/12  9:00 AM      Component Value Range   Color, Urine YELLOW  YELLOW   APPearance CLEAR  CLEAR   Specific Gravity, Urine 1.015  1.005 - 1.030   pH 6.0  5.0 - 8.0   Glucose, UA NEGATIVE  NEGATIVE mg/dL   Hgb urine dipstick TRACE (*) NEGATIVE   Bilirubin Urine NEGATIVE  NEGATIVE   Ketones, ur NEGATIVE  NEGATIVE mg/dL   Protein, ur NEGATIVE  NEGATIVE mg/dL   Urobilinogen, UA 0.2  0.0 - 1.0 mg/dL   Nitrite NEGATIVE  NEGATIVE   Leukocytes, UA SMALL (*) NEGATIVE  URINE MICROSCOPIC-ADD ON     Status: Abnormal   Collection Time   02/12/12  9:00 AM      Component Value Range   Squamous Epithelial / LPF RARE  RARE   WBC, UA 7-10  <3 WBC/hpf   RBC / HPF 0-2  <3 RBC/hpf   Bacteria, UA FEW (*) RARE    IMAGING NA  ASSESSMENT  1. UTI (lower urinary tract infection)     PLAN D/C home Urine culture pending Increase  water intake Medication List  As of 02/12/2012 10:49 AM   TAKE these medications         ciprofloxacin 500 MG tablet   Commonly known as: CIPRO   Take 1 tablet (500 mg total) by mouth 2 (two) times daily.      fluconazole 150 MG tablet   Commonly known as: DIFLUCAN   Take 1 tablet (150 mg total) by mouth once. If no relief after three days, may repeat dose      valACYclovir 1000 MG tablet   Commonly known as: VALTREX   Take 1,000 mg by mouth 2 (two) times daily as needed. Pt takes daily for suppression & takes twice daily x 5 days during outbreak           Follow-up Information    Follow up with primary care provider or MAU. (As needed if symptoms worsen)         Dorathy Kinsman 02/12/2012 10:49 AM

## 2012-02-12 NOTE — MAU Note (Signed)
Patient states she has been having symptoms of UTI since 7-22, frequency and pain with urination.

## 2012-02-12 NOTE — MAU Provider Note (Signed)
Attestation of Attending Supervision of Advanced Practitioner (CNM/NP): Evaluation and management procedures were performed by the Advanced Practitioner under my supervision and collaboration.  I have reviewed the Advanced Practitioner's note and chart, and I agree with the management and plan.  Jaynie Collins, M.D. 02/12/2012 11:00 AM

## 2012-05-30 ENCOUNTER — Emergency Department (HOSPITAL_COMMUNITY): Payer: Medicaid Other

## 2012-05-30 ENCOUNTER — Emergency Department (HOSPITAL_COMMUNITY)
Admission: EM | Admit: 2012-05-30 | Discharge: 2012-05-30 | Disposition: A | Discharge status: Home / Self Care | Payer: Medicaid Other | Attending: Emergency Medicine | Admitting: Emergency Medicine

## 2012-05-30 ENCOUNTER — Encounter (HOSPITAL_COMMUNITY): Payer: Self-pay | Admitting: Emergency Medicine

## 2012-05-30 DIAGNOSIS — M79609 Pain in unspecified limb: Secondary | ICD-10-CM | POA: Insufficient documentation

## 2012-05-30 DIAGNOSIS — R079 Chest pain, unspecified: Secondary | ICD-10-CM | POA: Insufficient documentation

## 2012-05-30 DIAGNOSIS — I10 Essential (primary) hypertension: Secondary | ICD-10-CM | POA: Insufficient documentation

## 2012-05-30 DIAGNOSIS — Z8679 Personal history of other diseases of the circulatory system: Secondary | ICD-10-CM | POA: Insufficient documentation

## 2012-05-30 DIAGNOSIS — Z8673 Personal history of transient ischemic attack (TIA), and cerebral infarction without residual deficits: Secondary | ICD-10-CM | POA: Insufficient documentation

## 2012-05-30 DIAGNOSIS — M542 Cervicalgia: Secondary | ICD-10-CM | POA: Insufficient documentation

## 2012-05-30 DIAGNOSIS — E119 Type 2 diabetes mellitus without complications: Secondary | ICD-10-CM | POA: Insufficient documentation

## 2012-05-30 DIAGNOSIS — R1013 Epigastric pain: Secondary | ICD-10-CM | POA: Insufficient documentation

## 2012-05-30 DIAGNOSIS — Z85118 Personal history of other malignant neoplasm of bronchus and lung: Secondary | ICD-10-CM | POA: Insufficient documentation

## 2012-05-30 IMAGING — CR DG CHEST 2V
2 series · 2 of 2 positions shown · non-contrast
Comparison: Chest radiograph performed [DATE]

CLINICAL DATA: Chest pain; left arm, neck and shoulder pain and
numbness.

CHEST - 2 VIEW

[w chest pa *]
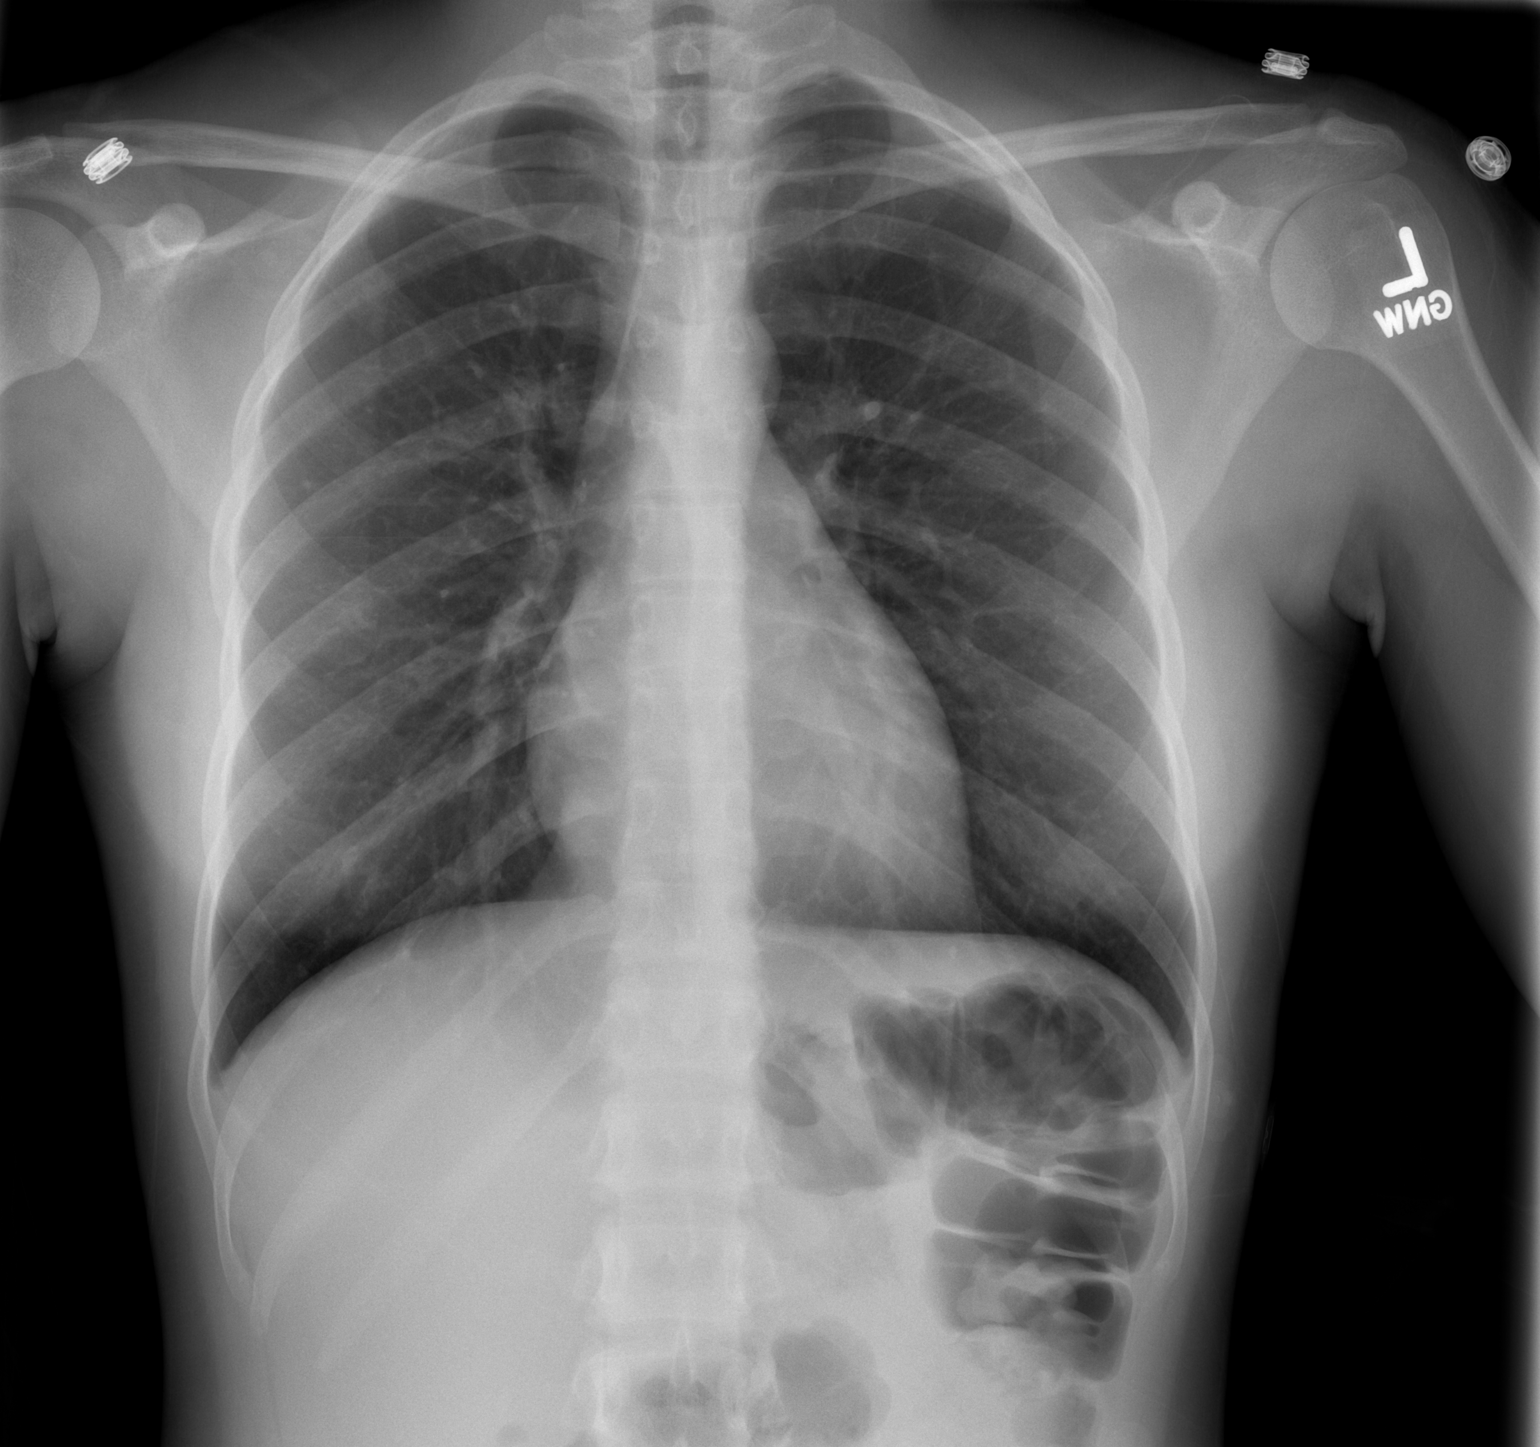

[w chest lat *]
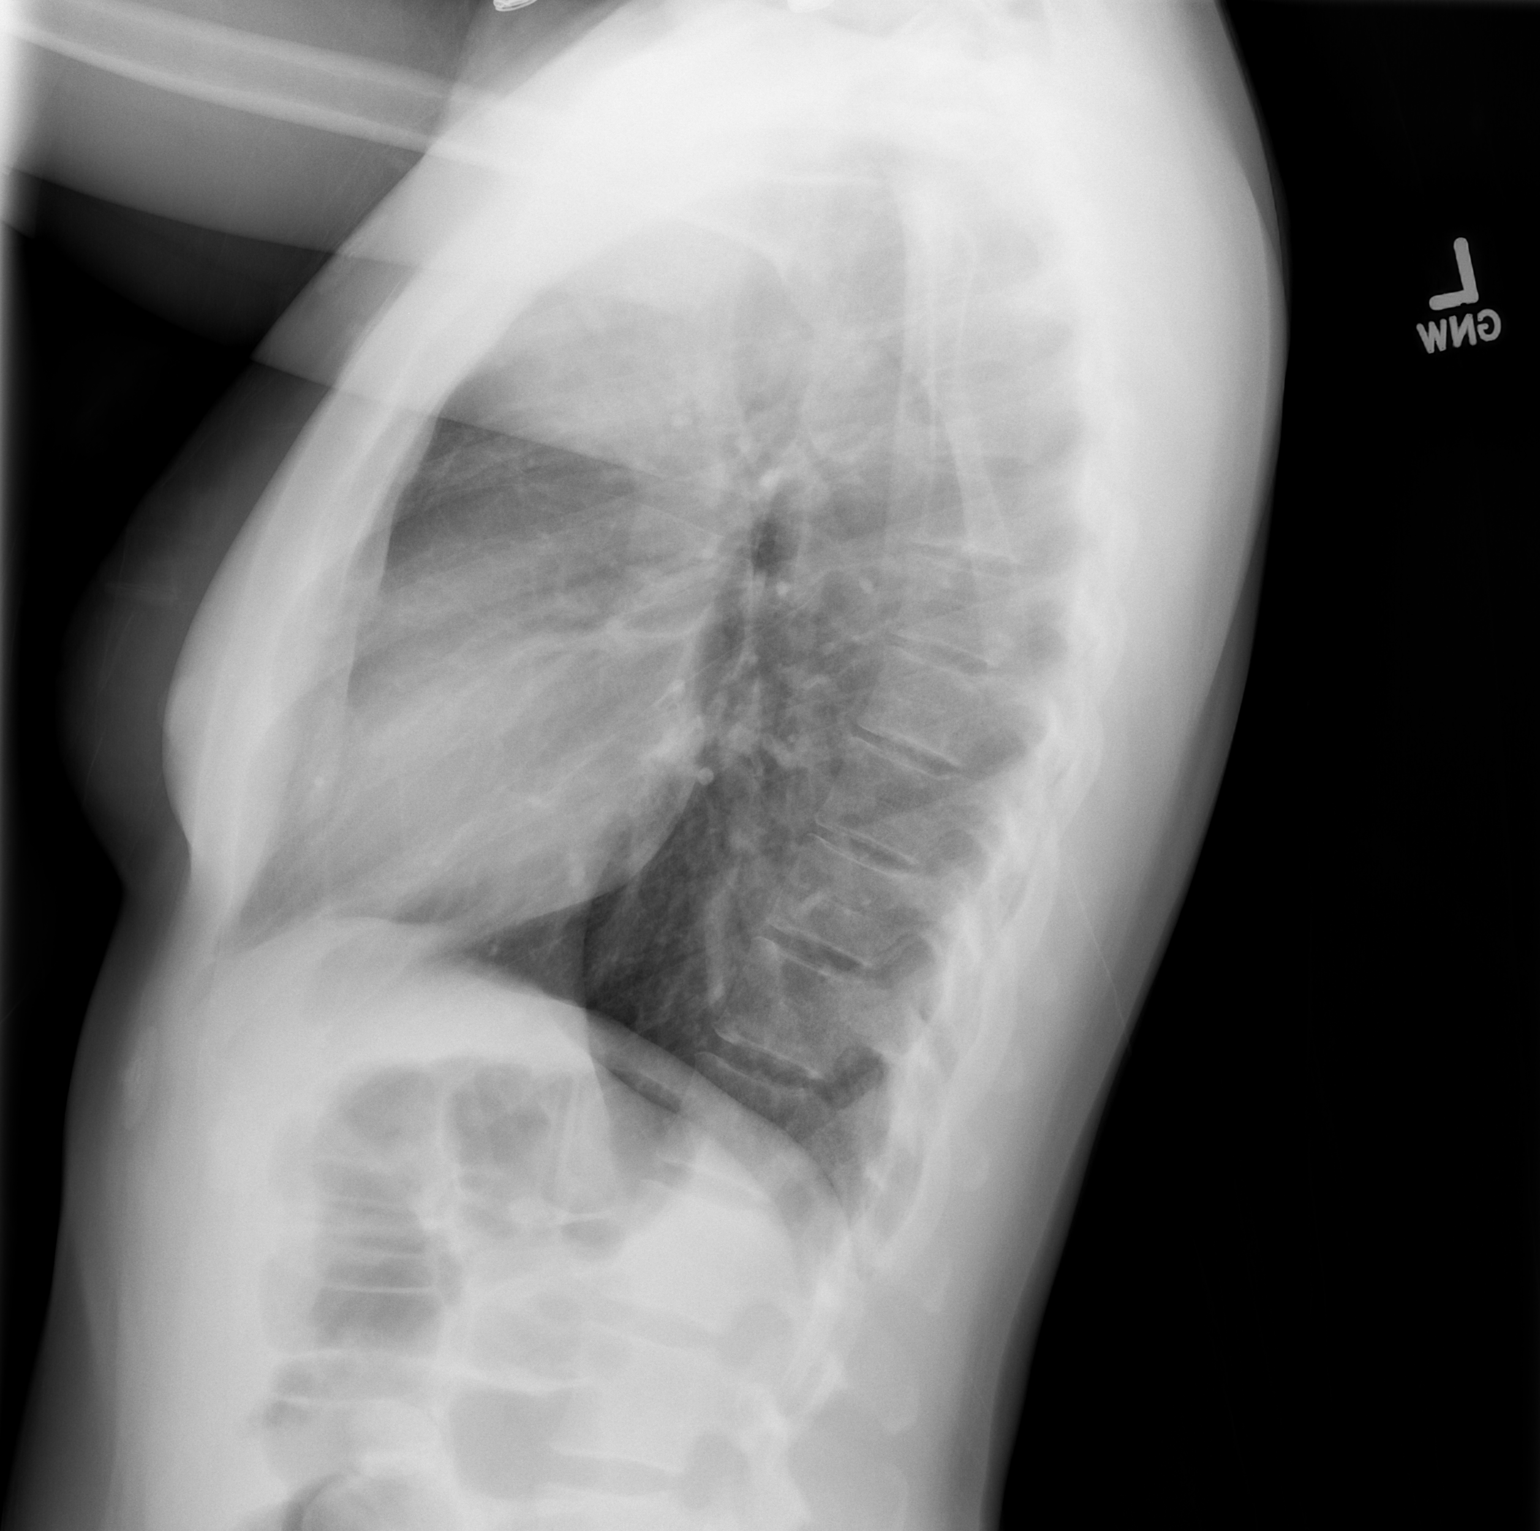

[2 of 2 positions shown; findings below may reference images not displayed]

FINDINGS: The lungs are well-aerated and clear.  There is no
evidence of focal opacification, pleural effusion or pneumothorax.

The heart is normal in size; the mediastinal contour is within
normal limits.  No acute osseous abnormalities are seen.
IMPRESSION: No acute cardiopulmonary process seen.

## 2012-05-30 MED ORDER — PANTOPRAZOLE SODIUM 40 MG PO TBEC
40.0000 mg | DELAYED_RELEASE_TABLET | Freq: Once | ORAL | Status: AC
Start: 2012-05-30 — End: 2012-05-30
  Administered 2012-05-30: 40 mg via ORAL
  Filled 2012-05-30: qty 1

## 2012-05-30 MED ORDER — FAMOTIDINE 20 MG PO TABS: 20.0000 mg | ORAL_TABLET | Freq: Two times a day (BID) | ORAL | Status: DC

## 2012-05-30 MED ORDER — GI COCKTAIL ~~LOC~~
30.0000 mL | Freq: Once | ORAL | Status: AC
  Administered 2012-05-30: 30 mL via ORAL
  Filled 2012-05-30: qty 30

## 2012-05-30 MED ORDER — FAMOTIDINE 20 MG PO TABS
20.0000 mg | ORAL_TABLET | Freq: Every day | ORAL | Status: DC
  Administered 2012-05-30: 20 mg via ORAL
  Filled 2012-05-30: qty 1

## 2012-05-30 NOTE — ED Provider Notes (Signed)
History     CSN: [id number]  Arrival date & time 05/30/12  6578   First MD Initiated Contact with Patient 05/30/12 0424      No chief complaint on file.   (Consider location/radiation/quality/duration/timing/severity/associated sxs/prior treatment) HPI Hx per PT. Substernal CP and epigastric pain, worse with laying down, better if she sits up. No SOB, nausea or diaphoresis, PT worried because her grandmother has heart disease. Mod in severity, no h/o same. Denies NSAIDs. Ate hamburger for dinner. No RUQ ABD pain. Has not taken any meds for this at home  Past Medical History  Diagnosis Date  . Abnormal Pap smear     repeat WNL  . Chlamydia   . Preterm labor   . Urinary tract infection   . MRSA (methicillin resistant staph aureus) culture positive   . Fracture closed, fibula, shaft 2007    left, no surgical repair  . Ovarian cyst     Past Surgical History  Procedure Date  . Cesarean section   . Wisdom tooth extraction 2004  . Tooth extraction     Family History  Problem Relation Age of Onset  . Hypertension Maternal Grandmother   . Stroke Maternal Grandmother   . Cancer Paternal Grandmother     brain, lung  . Diabetes Paternal Grandmother   . Heart disease Paternal Grandmother   . Hypertension Paternal Grandmother   . Anesthesia problems Neg Hx   . Hypotension Neg Hx   . Malignant hyperthermia Neg Hx   . Pseudochol deficiency Neg Hx     History  Substance Use Topics  . Smoking status: Never Smoker   . Smokeless tobacco: Never Used  . Alcohol Use: Yes     Comment: occ    OB History    Grav Para Term Preterm Abortions TAB SAB Ect Mult Living   6 2 2  4 2 1 1  2       Review of Systems  Constitutional: Negative for fever and chills.  HENT: Negative for neck pain and neck stiffness.   Eyes: Negative for pain.  Respiratory: Negative for shortness of breath.   Cardiovascular: Positive for chest pain.  Gastrointestinal: Positive for abdominal pain.    Genitourinary: Negative for dysuria.  Musculoskeletal: Negative for back pain.  Skin: Negative for rash.  Neurological: Negative for headaches.  All other systems reviewed and are negative.    Allergies  Flagyl and Latex  Home Medications   Current Outpatient Rx  Name  Route  Sig  Dispense  Refill  . ACETAMINOPHEN 500 MG PO TABS   Oral   Take 500 mg by mouth every 6 (six) hours as needed. For pain         . VALACYCLOVIR HCL 1 G PO TABS   Oral   Take 1,000 mg by mouth 2 (two) times daily as needed. Pt takes daily for suppression & takes twice daily x 5 days during outbreak           BP 116/70  Pulse 90  Temp 98.5 F (36.9 C) (Oral)  Resp 18  SpO2 100%  Physical Exam  Constitutional: She is oriented to person, place, and time. She appears well-developed and well-nourished.  HENT:  Head: Normocephalic and atraumatic.  Eyes: Conjunctivae normal and EOM are normal. Pupils are equal, round, and reactive to light.  Neck: Trachea normal. Neck supple. No thyromegaly present.  Cardiovascular: Normal rate, regular rhythm, S1 normal, S2 normal and normal pulses.     No systolic  murmur is present   No diastolic murmur is present  Pulses:      Radial pulses are 2+ on the right side, and 2+ on the left side.  Pulmonary/Chest: Effort normal and breath sounds normal. She has no wheezes. She has no rhonchi. She has no rales. She exhibits no tenderness.  Abdominal: Soft. Normal appearance and bowel sounds are normal. She exhibits no distension. There is no rebound, no guarding, no CVA tenderness and negative Murphy's sign.       Mild epigastric TTP, no RUQ tenderness, neg Murphys sign  Musculoskeletal:       BLE:s Calves nontender, no cords or erythema, negative Homans sign  Neurological: She is alert and oriented to person, place, and time. She has normal strength. No cranial nerve deficit or sensory deficit. GCS eye subscore is 4. GCS verbal subscore is 5. GCS motor subscore is  6.  Skin: Skin is warm and dry. No rash noted. She is not diaphoretic.  Psychiatric: Her speech is normal.       Cooperative and appropriate    ED Course  Procedures (including critical care time)  CXR reviewed by me - no PTX or acute cardioplumonary abnormality. No free air  PERC negative.    Date: 05/30/2012  Rate: 80  Rhythm: normal sinus rhythm  QRS Axis: normal  Intervals: normal  ST/T Wave abnormalities: normal  Conduction Disutrbances:none  Narrative Interpretation:   Old EKG Reviewed: none available  GI cocktail. Pepcid, protonix  Recheck symptoms improved. Stable for d/c home with GERD precautions verbalized as understood. RX pepcid and written precautions provided MDM   GERD symptoms in o/w healthy 26 yo female. screening ECG. CXR no free air. Improved with pepcid, GI cocktail and protonix        Sunnie Nielsen, MD 05/30/12 [phone]

## 2012-05-30 NOTE — ED Notes (Signed)
Pt reports recent onset of L chest shoulder and arm pain as well as L jaw pain and reports bio-mother had hx of MI pt denies birth control

## 2013-01-28 ENCOUNTER — Encounter (HOSPITAL_COMMUNITY): Payer: Self-pay | Admitting: Emergency Medicine

## 2013-01-28 ENCOUNTER — Emergency Department (HOSPITAL_COMMUNITY)
Admission: EM | Admit: 2013-01-28 | Discharge: 2013-01-28 | Disposition: A | Discharge status: Home / Self Care | Payer: Medicaid Other | Source: Home / Self Care | Attending: Family Medicine | Admitting: Family Medicine

## 2013-01-28 ENCOUNTER — Emergency Department (HOSPITAL_COMMUNITY)
Admission: EM | Admit: 2013-01-28 | Discharge: 2013-01-28 | Disposition: A | Discharge status: Home / Self Care | Payer: Self-pay | Source: Home / Self Care

## 2013-01-28 DIAGNOSIS — H6692 Otitis media, unspecified, left ear: Secondary | ICD-10-CM

## 2013-01-28 DIAGNOSIS — H6690 Otitis media, unspecified, unspecified ear: Secondary | ICD-10-CM

## 2013-01-28 HISTORY — DX: Herpesviral infection of other urogenital tract: A60.09

## 2013-01-28 MED ORDER — FLUCONAZOLE 150 MG PO TABS: 150.0000 mg | ORAL_TABLET | Freq: Once | ORAL | Status: DC

## 2013-01-28 MED ORDER — AMOXICILLIN 500 MG PO CAPS: 500.0000 mg | ORAL_CAPSULE | Freq: Three times a day (TID) | ORAL | Status: DC

## 2013-01-28 MED ORDER — FEXOFENADINE HCL 180 MG PO TABS: 180.0000 mg | ORAL_TABLET | Freq: Every day | ORAL | Status: DC

## 2013-01-28 MED ORDER — FLUTICASONE PROPIONATE 50 MCG/ACT NA SUSP: 1.0000 | Freq: Two times a day (BID) | NASAL | Status: DC

## 2013-01-28 NOTE — ED Provider Notes (Addendum)
History    CSN: [id number] Arrival date & time 01/28/13  1126  First MD Initiated Contact with Patient 01/28/13 1146     Chief Complaint  Patient presents with  . URI   (Consider location/radiation/quality/duration/timing/severity/associated sxs/prior Treatment) Patient is a 27 y.o. female presenting with URI. The history is provided by the patient.  URI Presenting symptoms: congestion, cough, ear pain and rhinorrhea   Presenting symptoms: no fever   Severity:  Mild Duration:  5 days Progression:  Worsening Chronicity:  New Relieved by:  Decongestant  Past Medical History  Diagnosis Date  . Abnormal Pap smear     repeat WNL  . Chlamydia   . Preterm labor   . Urinary tract infection   . MRSA (methicillin resistant staph aureus) culture positive   . Fracture closed, fibula, shaft 2007    left, no surgical repair  . Ovarian cyst   . Herpes genitalis in women    Past Surgical History  Procedure Laterality Date  . Cesarean section    . Wisdom tooth extraction  2004  . Tooth extraction     Family History  Problem Relation Age of Onset  . Hypertension Maternal Grandmother   . Stroke Maternal Grandmother   . Cancer Paternal Grandmother     brain, lung  . Diabetes Paternal Grandmother   . Heart disease Paternal Grandmother   . Hypertension Paternal Grandmother   . Anesthesia problems Neg Hx   . Hypotension Neg Hx   . Malignant hyperthermia Neg Hx   . Pseudochol deficiency Neg Hx    History  Substance Use Topics  . Smoking status: Never Smoker   . Smokeless tobacco: Never Used  . Alcohol Use: Yes     Comment: occ   OB History   Grav Para Term Preterm Abortions TAB SAB Ect Mult Living   6 2 2  4 2 1 1  2      Review of Systems  Constitutional: Negative.  Negative for fever.  HENT: Positive for ear pain, congestion, rhinorrhea and postnasal drip.   Respiratory: Positive for cough.     Allergies  Flagyl and Latex  Home Medications   Current Outpatient  Rx  Name  Route  Sig  Dispense  Refill  . valACYclovir (VALTREX) 1000 MG tablet   Oral   Take 1,000 mg by mouth 2 (two) times daily as needed. Pt takes daily for suppression & takes twice daily x 5 days during outbreak         . acetaminophen (TYLENOL) 500 MG tablet   Oral   Take 500 mg by mouth every 6 (six) hours as needed. For pain         . amoxicillin (AMOXIL) 500 MG capsule   Oral   Take 1 capsule (500 mg total) by mouth 3 (three) times daily.   30 capsule   0   . famotidine (PEPCID) 20 MG tablet   Oral   Take 1 tablet (20 mg total) by mouth 2 (two) times daily.   30 tablet   0   . fexofenadine (ALLEGRA) 180 MG tablet   Oral   Take 1 tablet (180 mg total) by mouth daily.   30 tablet   1   . fluticasone (FLONASE) 50 MCG/ACT nasal spray   Nasal   Place 1 spray into the nose 2 (two) times daily.   1 g   2    BP 121/82  Pulse 97  Temp(Src) 98.2 F (36.8 C) (  Oral)  Resp 18  SpO2 98%  LMP 01/28/2013 Physical Exam  Nursing note and vitals reviewed. Constitutional: She is oriented to person, place, and time. She appears well-developed and well-nourished.  HENT:  Head: Normocephalic.  Right Ear: Tympanic membrane, external ear and ear canal normal.  Left Ear: External ear and ear canal normal. Tympanic membrane is injected, erythematous and retracted. Tympanic membrane mobility is abnormal.  Mouth/Throat: Oropharynx is clear and moist.  Eyes: Conjunctivae and EOM are normal. Pupils are equal, round, and reactive to light.  Neck: Normal range of motion. Neck supple.  Lymphadenopathy:    She has no cervical adenopathy.  Neurological: She is alert and oriented to person, place, and time.  Skin: Skin is warm and dry.  Psychiatric: She has a normal mood and affect.    ED Course  Procedures (including critical care time) Labs Reviewed - No data to display No results found. 1. Otitis media of left ear     MDM    Linna Hoff, MD 01/28/13 1201  Linna Hoff, MD 01/28/13 [phone]

## 2013-01-28 NOTE — ED Notes (Signed)
Pt c/o cold sxs onset Sunday and left ear pain onset Wednesday... sxs include: PND, runny nose, itchy throat and ears, sneezing, cough w/yellow/green sputum... Denies: f/v/n/d... Taking OTC decongestants and Advil w/temp relief... She is alert w/no signs of acute distress.

## 2013-02-01 ENCOUNTER — Encounter (HOSPITAL_COMMUNITY): Payer: Self-pay | Admitting: Emergency Medicine

## 2013-02-01 ENCOUNTER — Emergency Department (HOSPITAL_COMMUNITY)
Admission: EM | Admit: 2013-02-01 | Discharge: 2013-02-01 | Disposition: A | Discharge status: Home / Self Care | Payer: Medicaid Other | Source: Home / Self Care

## 2013-02-01 DIAGNOSIS — H6692 Otitis media, unspecified, left ear: Secondary | ICD-10-CM

## 2013-02-01 DIAGNOSIS — H6690 Otitis media, unspecified, unspecified ear: Secondary | ICD-10-CM

## 2013-02-01 DIAGNOSIS — H6982 Other specified disorders of Eustachian tube, left ear: Secondary | ICD-10-CM

## 2013-02-01 DIAGNOSIS — J309 Allergic rhinitis, unspecified: Secondary | ICD-10-CM

## 2013-02-01 DIAGNOSIS — H6980 Other specified disorders of Eustachian tube, unspecified ear: Secondary | ICD-10-CM

## 2013-02-01 MED ORDER — CEFUROXIME AXETIL 250 MG PO TABS: 250.0000 mg | ORAL_TABLET | Freq: Two times a day (BID) | ORAL | Status: DC

## 2013-02-01 MED ORDER — PREDNISONE 20 MG PO TABS: ORAL_TABLET | ORAL | Status: DC

## 2013-02-01 NOTE — ED Provider Notes (Signed)
Medical screening examination/treatment/procedure(s) were performed by resident physician or non-physician practitioner and as supervising physician I was immediately available for consultation/collaboration.   Bralee Feldt DOUGLAS MD.   Sagrario Lineberry D Omeka Holben, MD 02/01/13 1152 

## 2013-02-01 NOTE — ED Notes (Signed)
C/o ear infection, Pt states she came in on Friday and was given Amoxicillin, Pt states she is not getting any better. Pt states her hearing is muffled and she feels pressure.  Pt is Oriented and alert.  Leilani Able CMA Student

## 2013-02-01 NOTE — ED Provider Notes (Signed)
History    CSN: [id number] Arrival date & time 02/01/13  4098  None    Chief Complaint  Patient presents with  . Otitis Media   (Consider location/radiation/quality/duration/timing/severity/associated sxs/prior Treatment) HPI Comments: This 27 year old female seen in the urgent care 4 days ago for left otitis media. She was treated with amoxicillin, fluticasone nasal spray and Allegra. She states her left ear remains painful and with diminished hearing. She denies fever, chills or additional symptoms. She continues to have upper respiratory congestion nasal discharge and PND. Denies shortness of breath or chest pain.  Past Medical History  Diagnosis Date  . Abnormal Pap smear     repeat WNL  . Chlamydia   . Preterm labor   . Urinary tract infection   . MRSA (methicillin resistant staph aureus) culture positive   . Fracture closed, fibula, shaft 2007    left, no surgical repair  . Ovarian cyst   . Herpes genitalis in women    Past Surgical History  Procedure Laterality Date  . Cesarean section    . Wisdom tooth extraction  2004  . Tooth extraction     Family History  Problem Relation Age of Onset  . Hypertension Maternal Grandmother   . Stroke Maternal Grandmother   . Cancer Paternal Grandmother     brain, lung  . Diabetes Paternal Grandmother   . Heart disease Paternal Grandmother   . Hypertension Paternal Grandmother   . Anesthesia problems Neg Hx   . Hypotension Neg Hx   . Malignant hyperthermia Neg Hx   . Pseudochol deficiency Neg Hx    History  Substance Use Topics  . Smoking status: Never Smoker   . Smokeless tobacco: Never Used  . Alcohol Use: Yes     Comment: occ   OB History   Grav Para Term Preterm Abortions TAB SAB Ect Mult Living   6 2 2  4 2 1 1  2      Review of Systems  Constitutional: Positive for activity change. Negative for fever, chills, appetite change and fatigue.  HENT: Positive for hearing loss, ear pain, congestion, sore throat,  rhinorrhea and postnasal drip. Negative for facial swelling, neck pain and neck stiffness.   Eyes: Negative.   Respiratory: Positive for cough. Negative for shortness of breath and wheezing.   Cardiovascular: Negative.   Gastrointestinal: Negative.   Skin: Negative for pallor and rash.  Neurological: Negative.     Allergies  Flagyl and Latex  Home Medications   Current Outpatient Rx  Name  Route  Sig  Dispense  Refill  . acetaminophen (TYLENOL) 500 MG tablet   Oral   Take 500 mg by mouth every 6 (six) hours as needed. For pain         . cefUROXime (CEFTIN) 250 MG tablet   Oral   Take 1 tablet (250 mg total) by mouth 2 (two) times daily.   20 tablet   0   . famotidine (PEPCID) 20 MG tablet   Oral   Take 1 tablet (20 mg total) by mouth 2 (two) times daily.   30 tablet   0   . fexofenadine (ALLEGRA) 180 MG tablet   Oral   Take 1 tablet (180 mg total) by mouth daily.   30 tablet   1   . fluconazole (DIFLUCAN) 150 MG tablet   Oral   Take 1 tablet (150 mg total) by mouth once.   1 tablet   1   . fluticasone (FLONASE)  50 MCG/ACT nasal spray   Nasal   Place 1 spray into the nose 2 (two) times daily.   1 g   2   . predniSONE (DELTASONE) 20 MG tablet      2 tabs po once daily x 5 days. Take with food.   10 tablet   0   . valACYclovir (VALTREX) 1000 MG tablet   Oral   Take 1,000 mg by mouth 2 (two) times daily as needed. Pt takes daily for suppression & takes twice daily x 5 days during outbreak          BP 101/66  Pulse 78  Temp(Src) 97.9 F (36.6 C) (Oral)  Resp 16  SpO2 100%  LMP 01/28/2013 Physical Exam  Nursing note and vitals reviewed. Constitutional: She is oriented to person, place, and time. She appears well-developed and well-nourished. No distress.  HENT:  Head: Normocephalic and atraumatic.  Right TM with minor retraction. Left TM with deep erythema particularly to the superior aspect of the TM. The TM is also retracted. No  bulging. Oropharynx with erythema and cobblestoning. Positive for clear PND but no exudates.  Eyes: EOM are normal. Pupils are equal, round, and reactive to light.  Neck: Normal range of motion. Neck supple.  Cardiovascular: Normal rate and normal heart sounds.   Pulmonary/Chest: Breath sounds normal. No respiratory distress. She has no wheezes.  Musculoskeletal: She exhibits no edema and no tenderness.  Lymphadenopathy:    She has no cervical adenopathy.  Neurological: She is alert and oriented to person, place, and time. No cranial nerve deficit.  Skin: Skin is warm and dry.  Psychiatric: She has a normal mood and affect.    ED Course  Procedures (including critical care time) Labs Reviewed - No data to display No results found. 1. Otitis media of left ear follow-up, not resolved   2. ETD (eustachian tube dysfunction), left   3. Allergic rhinitis     MDM  Stop the amoxicillin and start Ceftin 250 mg twice a day for 10 days Start Sudafed PE 10 mg one every 4 hours when necessary congestion Prednisone 40 mg daily for 5 days. Take with food Continue the nasal sprays and had saline nasal spray and use copiously. Continued the Allegra daily. For worsening new symptoms or problems may return otherwise followup your PCP.  Hayden Rasmussen, NP 02/01/13 [phone]

## 2013-05-10 ENCOUNTER — Encounter: Payer: Self-pay | Admitting: Obstetrics

## 2013-05-11 ENCOUNTER — Ambulatory Visit (INDEPENDENT_AMBULATORY_CARE_PROVIDER_SITE_OTHER): Payer: Medicaid Other | Admitting: Obstetrics

## 2013-05-11 ENCOUNTER — Encounter: Payer: Self-pay | Admitting: Obstetrics

## 2013-05-11 VITALS — BP 108/74 | HR 86 | Temp 98.2°F | Ht 63.0 in | Wt 133.0 lb

## 2013-05-11 DIAGNOSIS — Z0000 Encounter for general adult medical examination without abnormal findings: Secondary | ICD-10-CM

## 2013-05-11 DIAGNOSIS — A6000 Herpesviral infection of urogenital system, unspecified: Secondary | ICD-10-CM

## 2013-05-11 NOTE — Progress Notes (Signed)
Subjective:     Andrea Dean is a 27 y.o. female here for a routine exam.  Current complaints: annual exam. Pt states she has had 2 miscarriages and an ectopic. Pt is concerned about whether to carry another pregnancy. Personal health questionnaire reviewed: yes.   Gynecologic History Patient's last menstrual period was 04/22/2013. Contraception: abstinence Last Pap: 2012. Results were: normal   Obstetric History OB History  Gravida Para Term Preterm AB SAB TAB Ectopic Multiple Living  6 2 2  4 1 2 1  2     # Outcome Date GA Lbr Len/2nd Weight Sex Delivery Anes PTL Lv  6 TRM 12/28/07     VBAC   Y     Comments: IOL for oligo & SGA per patient  5 TRM 07/21/04     CS   Y     Comments: c/section for failure to progress  4 SAB           3 TAB           2 TAB           1 ECT                The following portions of the patient's history were reviewed and updated as appropriate: allergies, current medications, past family history, past medical history, past social history, past surgical history and problem list.  Review of Systems Pertinent items are noted in HPI.    Objective:    General appearance: alert and no distress Breasts: normal appearance, no masses or tenderness Abdomen: normal findings: soft, non-tender Pelvic: cervix normal in appearance, no adnexal masses or tenderness, no cervical motion tenderness, positive findings: nontender healing erosions right vulva, uterus normal size, shape, and consistency and vagina normal without discharge    Assessment:    Healthy female exam.   Probable folliculitis right vulva   H/O Genital Herpes, on suppression     Plan:    Education reviewed: safe sex/STD prevention, self breast exams and management of Genital Herpes. Contraception: abstinence. Follow up in: 1 year.    Continue Valtrex suppresion

## 2013-05-11 NOTE — Addendum Note (Signed)
Addended by: Elby Beck F on: 05/11/2013 06:10 PM   Modules accepted: Orders

## 2013-05-13 LAB — PAP IG W/ RFLX HPV ASCU

## 2013-05-13 LAB — WET PREP BY MOLECULAR PROBE
Candida species: NEGATIVE
Gardnerella vaginalis: NEGATIVE
Trichomonas vaginosis: NEGATIVE

## 2013-05-13 LAB — GC/CHLAMYDIA PROBE AMP
CT Probe RNA: NEGATIVE
GC Probe RNA: NEGATIVE

## 2013-07-29 ENCOUNTER — Encounter (INDEPENDENT_AMBULATORY_CARE_PROVIDER_SITE_OTHER): Payer: Self-pay

## 2013-07-29 ENCOUNTER — Encounter (INDEPENDENT_AMBULATORY_CARE_PROVIDER_SITE_OTHER): Payer: Self-pay | Admitting: General Surgery

## 2013-07-29 ENCOUNTER — Ambulatory Visit (INDEPENDENT_AMBULATORY_CARE_PROVIDER_SITE_OTHER): Payer: Medicaid Other | Admitting: General Surgery

## 2013-07-29 VITALS — BP 120/66 | HR 68 | Temp 98.2°F | Resp 14 | Ht 63.0 in | Wt 120.2 lb

## 2013-07-29 DIAGNOSIS — L0231 Cutaneous abscess of buttock: Secondary | ICD-10-CM

## 2013-07-29 DIAGNOSIS — L03317 Cellulitis of buttock: Secondary | ICD-10-CM

## 2013-07-29 NOTE — Progress Notes (Signed)
Patient ID: Andrea Dean, female   DOB: 07/25/1985, 28 y.o.   MRN: [id number]  Chief Complaint  Patient presents with  . New Evaluation    eval pilonidal cyst    HPI Andrea Dean is a 28 y.o. female.  The patient is a 28 year old female who is referred by Dr.Avbuere for evaluation of a pilonidal cyst. He states that approximately 2 weeks ago she was laying on top for approximately 2 hours. She said the following day she had some pain and some edema to her sacral area. Patient after several days of abscess formation was seen in the urgent clinic. She had the area lanced at that time. She was told at that time this with a pilonidal cyst. Antibiotics which he states at this time.   Patient states that this is her first occurrence. She no longer has any drainage. She told antibiotics. HPI  Past Medical History  Diagnosis Date  . Abnormal Pap smear     repeat WNL  . Chlamydia   . Preterm labor   . Urinary tract infection   . MRSA (methicillin resistant staph aureus) culture positive   . Fracture closed, fibula, shaft 2007    left, no surgical repair  . Ovarian cyst   . Herpes genitalis in women     Past Surgical History  Procedure Laterality Date  . Cesarean section    . Wisdom tooth extraction  2004  . Tooth extraction      Family History  Problem Relation Age of Onset  . Hypertension Maternal Grandmother   . Stroke Maternal Grandmother   . Cancer Paternal Grandmother     brain, lung  . Diabetes Paternal Grandmother   . Heart disease Paternal Grandmother   . Hypertension Paternal Grandmother   . Anesthesia problems Neg Hx   . Hypotension Neg Hx   . Malignant hyperthermia Neg Hx   . Pseudochol deficiency Neg Hx     Social History History  Substance Use Topics  . Smoking status: Never Smoker   . Smokeless tobacco: Never Used  . Alcohol Use: No     Comment: occ    Allergies  Allergen Reactions  . Flagyl [Metronidazole Hcl] Hives and Nausea And Vomiting  . Latex      sensitive    Current Outpatient Prescriptions  Medication Sig Dispense Refill  . acetaminophen (TYLENOL) 500 MG tablet Take 500 mg by mouth every 6 (six) hours as needed. For pain      . famotidine (PEPCID) 20 MG tablet Take 1 tablet (20 mg total) by mouth 2 (two) times daily.  30 tablet  0  . fexofenadine (ALLEGRA) 180 MG tablet Take 1 tablet (180 mg total) by mouth daily.  30 tablet  1  . fluticasone (FLONASE) 50 MCG/ACT nasal spray Place 1 spray into the nose 2 (two) times daily.  1 g  2  . valACYclovir (VALTREX) 1000 MG tablet Take 1,000 mg by mouth 2 (two) times daily as needed. Pt takes daily for suppression & takes twice daily x 5 days during outbreak       No current facility-administered medications for this visit.    Review of Systems Review of Systems  Constitutional: Negative.   HENT: Negative.   Respiratory: Negative.   Cardiovascular: Negative.   Gastrointestinal: Negative.   Neurological: Negative.   All other systems reviewed and are negative.    Blood pressure 120/66, pulse 68, temperature 98.2 F (36.8 C), temperature source Temporal, resp. rate 14,  height 5\' 3"  (1.6 m), weight 120 lb 3.2 oz (54.522 kg).  Physical Exam Physical Exam  Constitutional: She is oriented to person, place, and time. She appears well-developed and well-nourished.  HENT:  Head: Normocephalic and atraumatic.  Eyes: Conjunctivae and EOM are normal. Pupils are equal, round, and reactive to light.  Neck: Normal range of motion. Neck supple.  Cardiovascular: Normal rate, regular rhythm and normal heart sounds.   Pulmonary/Chest: Effort normal and breath sounds normal.  Abdominal: Soft. Bowel sounds are normal. There is no tenderness. There is no rebound and no guarding.  Musculoskeletal: Normal range of motion.  Neurological: She is alert and oriented to person, place, and time.  Skin: Skin is warm and dry.     Psychiatric: She has a normal mood and affect.    Data  Reviewed none  Assessment    28 year old female with pilonidal cyst versus healed gluteal abscess.     Plan    1. Recommend patient to keep an eye on the area. I do not see any cystic gluteal cleft. We discussed the pathophysiology of bowel and assistant away they can drain in the future. I asked her to keep an eye on this should this occur to return to clinic. 2. Patient can follow up as needed        Marigene EhlersRamirez Jr., Jed LimerickArmando 07/29/2013, 8:30 AM

## 2013-11-02 ENCOUNTER — Encounter (INDEPENDENT_AMBULATORY_CARE_PROVIDER_SITE_OTHER): Payer: Self-pay | Admitting: General Surgery

## 2013-11-02 ENCOUNTER — Ambulatory Visit (INDEPENDENT_AMBULATORY_CARE_PROVIDER_SITE_OTHER): Payer: Medicaid Other | Admitting: General Surgery

## 2013-11-02 VITALS — BP 120/75 | HR 95 | Temp 96.8°F | Resp 16 | Ht 63.0 in | Wt 125.8 lb

## 2013-11-02 DIAGNOSIS — L0591 Pilonidal cyst without abscess: Secondary | ICD-10-CM

## 2013-11-02 DIAGNOSIS — L0592 Pilonidal sinus without abscess: Secondary | ICD-10-CM

## 2013-11-02 MED ORDER — AMOXICILLIN-POT CLAVULANATE 875-125 MG PO TABS: 1.0000 | ORAL_TABLET | Freq: Two times a day (BID) | ORAL | Status: AC

## 2013-11-02 NOTE — Assessment & Plan Note (Signed)
Patient is not yet have a fluctuant area to drain. She does have a small amount of cellulitis. I will place her on Augmentin for 10 days. I also advised her to place antibiotic ointment or Vaseline on the surface and take warm water baths twice daily as well. I will see her back in 2 weeks.  I advised her to call earlier if this becomes worse as this may not head it off.  She may require an I&D again.

## 2013-11-02 NOTE — Patient Instructions (Signed)
Take antibiotic twice daily.  Do warm water soaks twice daily  Place antibiotic ointment or vaseline twice daily

## 2013-11-02 NOTE — Progress Notes (Signed)
Subjective:     Patient ID: Andrea BelliniAriel C Jones, female   DOB: [birth year], 28 y.o.   MRN: [id number]  HPI   Review of Systems  All other systems reviewed and are negative.      Objective:   Physical Exam  Constitutional: She is oriented to person, place, and time. She appears well-developed and well-nourished. No distress.  HENT:  Head: Normocephalic and atraumatic.  Eyes: Conjunctivae are normal. No scleral icterus.  Neck: Normal range of motion.  Cardiovascular: Normal rate.   Pulmonary/Chest: Effort normal. No respiratory distress.  Abdominal: Soft.  Musculoskeletal: Normal range of motion.  Neurological: She is alert and oriented to person, place, and time.  Skin: Skin is warm and dry. No rash noted. She is not diaphoretic. There is erythema. No pallor.     Induration and erythema just to right of midline.    No fluctuance.    Psychiatric: She has a normal mood and affect. Her behavior is normal. Judgment and thought content normal.       Assessment/Plan:     Pilonidal sinus without abscess Patient is not yet have a fluctuant area to drain. She does have a small amount of cellulitis. I will place her on Augmentin for 10 days. I also advised her to place antibiotic ointment or Vaseline on the surface and take warm water baths twice daily as well. I will see her back in 2 weeks.  I advised her to call earlier if this becomes worse as this may not head it off.  She may require an I&D again.

## 2013-11-03 ENCOUNTER — Other Ambulatory Visit (INDEPENDENT_AMBULATORY_CARE_PROVIDER_SITE_OTHER): Payer: Self-pay | Admitting: General Surgery

## 2013-11-03 ENCOUNTER — Telehealth (INDEPENDENT_AMBULATORY_CARE_PROVIDER_SITE_OTHER): Payer: Self-pay | Admitting: General Surgery

## 2013-11-03 NOTE — Telephone Encounter (Signed)
Left mess that saw in chart allergy to diflucan  So needs try monastat across the counter Also can try eating yogurt. Ask her to call and confirm got message.[phone] eh

## 2013-11-06 ENCOUNTER — Encounter (HOSPITAL_COMMUNITY): Payer: Self-pay | Admitting: Emergency Medicine

## 2013-11-06 ENCOUNTER — Emergency Department (HOSPITAL_COMMUNITY)
Admission: EM | Admit: 2013-11-06 | Discharge: 2013-11-06 | Disposition: A | Discharge status: Home / Self Care | Payer: Medicaid Other | Attending: Emergency Medicine | Admitting: Emergency Medicine

## 2013-11-06 DIAGNOSIS — Z87440 Personal history of urinary (tract) infections: Secondary | ICD-10-CM | POA: Insufficient documentation

## 2013-11-06 DIAGNOSIS — Z79899 Other long term (current) drug therapy: Secondary | ICD-10-CM | POA: Insufficient documentation

## 2013-11-06 DIAGNOSIS — Z22322 Carrier or suspected carrier of Methicillin resistant Staphylococcus aureus: Secondary | ICD-10-CM | POA: Insufficient documentation

## 2013-11-06 DIAGNOSIS — Z8619 Personal history of other infectious and parasitic diseases: Secondary | ICD-10-CM | POA: Insufficient documentation

## 2013-11-06 DIAGNOSIS — Z91040 Latex allergy status: Secondary | ICD-10-CM | POA: Insufficient documentation

## 2013-11-06 DIAGNOSIS — Z888 Allergy status to other drugs, medicaments and biological substances status: Secondary | ICD-10-CM | POA: Insufficient documentation

## 2013-11-06 DIAGNOSIS — L0592 Pilonidal sinus without abscess: Secondary | ICD-10-CM

## 2013-11-06 DIAGNOSIS — N83209 Unspecified ovarian cyst, unspecified side: Secondary | ICD-10-CM | POA: Insufficient documentation

## 2013-11-06 DIAGNOSIS — Z8781 Personal history of (healed) traumatic fracture: Secondary | ICD-10-CM | POA: Insufficient documentation

## 2013-11-06 DIAGNOSIS — L0591 Pilonidal cyst without abscess: Secondary | ICD-10-CM | POA: Insufficient documentation

## 2013-11-06 MED ORDER — KETOROLAC TROMETHAMINE 60 MG/2ML IM SOLN
60.0000 mg | Freq: Once | INTRAMUSCULAR | Status: AC
  Administered 2013-11-06: 60 mg via INTRAMUSCULAR
  Filled 2013-11-06: qty 2

## 2013-11-06 MED ORDER — TRAMADOL HCL 50 MG PO TABS: 50.0000 mg | ORAL_TABLET | Freq: Four times a day (QID) | ORAL | Status: DC | PRN

## 2013-11-06 NOTE — ED Provider Notes (Signed)
CSN: [id number]     Arrival date & time 11/06/13  [id number] History  This chart was scribed for non-physician practitioner Andrea BeckKaitlyn Gianni Fuchs, working with Dagmar HaitWilliam Blair Walden, MD by Carl Bestelina Holson, ED Scribe. This patient was seen in room TR11C/TR11C and the patient's care was started at 10:15 AM.    Chief Complaint  Patient presents with  . Abscess     Patient is a 28 y.o. female presenting with abscess. The history is provided by the patient. No language interpreter was used.  Abscess  HPI Comments: Andrea Dean is a 28 y.o. female who presents to the Emergency Department complaining of a painful abscess located on her tailbone area.  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  She states that she was started on antibiotics by surgery.  The patient states that she has a follow-up appointment with general surgery in 2 weeks.  She states that she has been doing warm soaks, applying warm compresses to the area, and taking 800 mg Ibuprofen with no relief to her symptoms.  She states that she has Hydrocodone at home but does not like taking it because it makes her sleepy.     Past Medical History  Diagnosis Date  . Abnormal Pap smear     repeat WNL  . Chlamydia   . Preterm labor   . Urinary tract infection   . MRSA (methicillin resistant staph aureus) culture positive   . Fracture closed, fibula, shaft 2007    left, no surgical repair  . Ovarian cyst   . Herpes genitalis in women    Past Surgical History  Procedure Laterality Date  . Cesarean section    . Wisdom tooth extraction  2004  . Tooth extraction     Family History  Problem Relation Age of Onset  . Hypertension Maternal Grandmother   . Stroke Maternal Grandmother   . Cancer Paternal Grandmother     brain, lung  . Diabetes Paternal Grandmother   . Heart disease Paternal Grandmother   . Hypertension Paternal Grandmother   . Anesthesia problems Neg Hx   . Hypotension Neg Hx   .  Malignant hyperthermia Neg Hx   . Pseudochol deficiency Neg Hx    History  Substance Use Topics  . Smoking status: Never Smoker   . Smokeless tobacco: Never Used  . Alcohol Use: No     Comment: occ   OB History   Grav Para Term Preterm Abortions TAB SAB Ect Mult Living   6 2 2  4 2 1 1  2      Review of Systems  All other systems reviewed and are negative.     Allergies  Flagyl and Latex  Home Medications   Prior to Admission medications   Medication Sig Start Date End Date Taking? Authorizing Provider  acetaminophen (TYLENOL) 500 MG tablet Take 500 mg by mouth every 6 (six) hours as needed. For pain    Historical Provider, MD  amoxicillin-clavulanate (AUGMENTIN) 875-125 MG per tablet Take 1 tablet by mouth 2 (two) times daily. 11/02/13 11/16/13  Almond LintFaera Byerly, MD  famotidine (PEPCID) 20 MG tablet Take 1 tablet (20 mg total) by mouth 2 (two) times daily. 05/30/12   Sunnie NielsenBrian Opitz, MD  fexofenadine (ALLEGRA) 180 MG tablet Take 1 tablet (180 mg total) by mouth daily. 01/28/13   Linna HoffJames D Kindl, MD  fluticasone (FLONASE) 50 MCG/ACT nasal spray Place 1 spray into the nose 2 (two) times daily.  01/28/13   Linna HoffJames D Kindl, MD  valACYclovir (VALTREX) 1000 MG tablet Take 1,000 mg by mouth 2 (two) times daily as needed. Pt takes daily for suppression & takes twice daily x 5 days during outbreak    Historical Provider, MD   Triage Vitals: BP 129/89  Pulse 104  Temp(Src) 98.7 F (37.1 C) (Oral)  Resp 18  Wt 125 lb (56.7 kg)  SpO2 100%  Physical Exam  Nursing note and vitals reviewed. Constitutional: She is oriented to person, place, and time. She appears well-developed and well-nourished. No distress.  HENT:  Head: Normocephalic and atraumatic.  Eyes: Conjunctivae and EOM are normal.  Neck: Normal range of motion.  Cardiovascular: Normal rate and regular rhythm.  Exam reveals no gallop and no friction rub.   No murmur heard. Pulmonary/Chest: Effort normal and breath sounds normal. She has  no wheezes. She has no rales. She exhibits no tenderness.  Abdominal: Soft. There is no tenderness.  Musculoskeletal: Normal range of motion.  Neurological: She is alert and oriented to person, place, and time.  Speech is goal-oriented. Moves limbs without ataxia.   Skin: Skin is warm and dry.  Induration and erythema at gluteal cleft without fluctuance or drainage.   Psychiatric: She has a normal mood and affect. Her behavior is normal.    ED Course  Procedures (including critical care time)  DIAGNOSTIC STUDIES: Oxygen Saturation is 100% on room air, normal by my interpretation.    COORDINATION OF CARE: 10:17 AM- Discussed administering a Toradol shot in the ED and discharging the patient with a prescription for Tramadol.  Advised the patient to continue taking her antibiotics and call general surgery about her symptoms tomorrow morning.  The patient agreed to the treatment plan.   Labs Review Labs Reviewed - No data to display  Imaging Review No results found.   EKG Interpretation None      MDM   Final diagnoses:  Pilonidal sinus    10:19 AM Patient's pilonidal area has no fluctuance. The area is not ready for incision and drainage. Patient will have toradol IM here. Patient advised to continue taking antibiotics and follow up with General Surgery tomorrow. Vitals stable and patient afebrile.   I personally performed the services described in this documentation, which was scribed in my presence. The recorded information has been reviewed and is accurate.    Andrea BeckKaitlyn Ilyssa Grennan, PA-C 11/06/13 1635

## 2013-11-06 NOTE — ED Notes (Signed)
Pt is here with abscess to tailbone area and was placed on anbx by surgery, last one they lanced and packed

## 2013-11-06 NOTE — Discharge Instructions (Signed)
Follow up with Dr. Donell BeersByerly as directed. Take Tramadol as needed for pain.

## 2013-11-07 ENCOUNTER — Ambulatory Visit (INDEPENDENT_AMBULATORY_CARE_PROVIDER_SITE_OTHER): Payer: Medicaid Other | Admitting: General Surgery

## 2013-11-07 ENCOUNTER — Encounter (INDEPENDENT_AMBULATORY_CARE_PROVIDER_SITE_OTHER): Payer: Self-pay | Admitting: General Surgery

## 2013-11-07 VITALS — BP 124/80 | HR 80 | Temp 98.6°F | Resp 16 | Ht 63.0 in | Wt 125.0 lb

## 2013-11-07 DIAGNOSIS — L0592 Pilonidal sinus without abscess: Secondary | ICD-10-CM

## 2013-11-07 DIAGNOSIS — L0591 Pilonidal cyst without abscess: Secondary | ICD-10-CM

## 2013-11-07 DIAGNOSIS — L0501 Pilonidal cyst with abscess: Secondary | ICD-10-CM

## 2013-11-07 NOTE — Progress Notes (Signed)
Patient ID: Andrea BelliniAriel C Dean, female   DOB: 1986-01-18, 28 y.o.   MRN: [id number] History: This patient saw Dr. Donell BeersByerly last week and had a pilonidal cyst without abscess. She was started on Augmentin. It is now 5 days later she says the swelling and pain are much worse  Exam: Large pilonidal abscess, mostly on the right side.  Procedure: Informed consent given she is in [employer] and has good understanding. Betadine prep. 1% Xylocaine with epinephrine local 2.5 cm longitudinal incision just to the right of the midline. Large abscess drained. Loculations broken up. Irrigated. Packed loosely with iodoform gauze. Fluffy bandage. Tolerated well  Assessment: Pilonidal abscess  Plan: Sitz baths 3 times daily Remove packing Wednesday and do not repack Continue antibiotics Return to see me in 2 weeks.     Angelia MouldHaywood M. Derrell LollingIngram, M.D., [facility] General and Minimally invasive Surgery Breast and Colorectal Surgery Office:   [phone] Pager:   [phone]

## 2013-11-07 NOTE — Patient Instructions (Signed)
We drained a large pilonidal abscess today.  The abscess cavity has been packed loosely with iodoform gauze.  Starting tomorrow, take a warm tub bath 3 times a day, and then cover the wound with dry gauze. Expect some drainage. Wear a pad.  If the iodoform gauze packing has not fallen out by Wednesday morning, remove the packing. Do not repack..  Continue to take warm tub baths 3 times a day  Take the antibiotics until they are all gone  Return to see Dr. Derrell LollingIngram in 2 weeks.

## 2013-11-11 NOTE — ED Provider Notes (Signed)
Medical screening examination/treatment/procedure(s) were performed by non-physician practitioner and as supervising physician I was immediately available for consultation/collaboration.   EKG Interpretation None        Dagmar HaitWilliam Virgle Arth, MD 11/11/13 [phone]

## 2013-11-21 ENCOUNTER — Ambulatory Visit (INDEPENDENT_AMBULATORY_CARE_PROVIDER_SITE_OTHER): Payer: Medicaid Other | Admitting: General Surgery

## 2013-11-21 ENCOUNTER — Encounter (INDEPENDENT_AMBULATORY_CARE_PROVIDER_SITE_OTHER): Payer: Medicaid Other | Admitting: General Surgery

## 2013-11-21 ENCOUNTER — Encounter (INDEPENDENT_AMBULATORY_CARE_PROVIDER_SITE_OTHER): Payer: Self-pay | Admitting: General Surgery

## 2013-11-21 VITALS — BP 94/64 | HR 78 | Temp 97.1°F | Resp 12 | Ht 63.0 in | Wt 125.4 lb

## 2013-11-21 DIAGNOSIS — L0501 Pilonidal cyst with abscess: Secondary | ICD-10-CM

## 2013-11-21 NOTE — Progress Notes (Signed)
Patient ID: Julieta BelliniAriel C Jones, female   DOB: 05/08/1986, 28 y.o.   MRN: [id number] History: The patient underwent incision and drainage of a large pilonidal abscess by me on 11/07/2013. She was uncomfortable for sternal days but now feels fine. She says that the drainage has stopped and she has not had wear a pad for sternal days. She has no pain and does not feel a lot. She wasn't aware that she needs another operation.  Exam: Small incision to the right of the midline is well healed. Tissues are soft. Nontender. No signs of any cellulitis or residual fluid or drainage. No dimpling  Assessment: Recurrent pilonidal abscess. Second episode. Now completed recovered following incision and drainage  Plan: With a long talk about definitive surgical excision with packing and healing by secondary intention. We talked about observation and the fact that she is at somewhat increased risk for this happening again, although this is not compelling enough to insist on surgical intervention. She has expressed some interest in definitive surgery but for now, wants to go home and think about it. She will return to see me as needed.   Angelia MouldHaywood M. Derrell LollingIngram, M.D., [facility] General and Minimally invasive Surgery Breast and Colorectal Surgery Office:   [phone] Pager:   [phone]

## 2013-11-21 NOTE — Patient Instructions (Signed)
The pilonidal abscess has completely healed and closed up. The tissues are soft and there does not appear to be any residual infection.  You were given the option of observation or definitive surgery. We discussed the pros and cons of each approach.  You have stated you want to go home and think about this, which is very reasonable.  Return to see us as needed.

## 2014-02-20 ENCOUNTER — Telehealth: Payer: Self-pay | Admitting: *Deleted

## 2014-02-20 NOTE — Telephone Encounter (Signed)
Patient states she has vaginal irritation. 1:45 Spoke with patient - she states she has irritation- itching in the vagina- no discharge and no sexual activity for months. She states it occurred after her cycle. After discussion of symptoms-

## 2014-02-20 NOTE — Telephone Encounter (Signed)
Appointment offered. Patient declined - she states she has Diflucan at home and will try that first. Patient to call back by Wednesday if no better.

## 2014-03-22 ENCOUNTER — Ambulatory Visit (INDEPENDENT_AMBULATORY_CARE_PROVIDER_SITE_OTHER): Payer: Medicaid Other | Admitting: Obstetrics

## 2014-03-22 ENCOUNTER — Encounter: Payer: Self-pay | Admitting: Obstetrics

## 2014-03-22 VITALS — BP 122/82 | HR 74 | Temp 97.4°F | Ht 62.5 in | Wt 128.0 lb

## 2014-03-22 DIAGNOSIS — Z113 Encounter for screening for infections with a predominantly sexual mode of transmission: Secondary | ICD-10-CM

## 2014-03-22 DIAGNOSIS — A6004 Herpesviral vulvovaginitis: Secondary | ICD-10-CM

## 2014-03-22 DIAGNOSIS — N760 Acute vaginitis: Secondary | ICD-10-CM

## 2014-03-22 MED ORDER — VALACYCLOVIR HCL 1 G PO TABS: ORAL_TABLET | ORAL | Status: DC

## 2014-03-23 ENCOUNTER — Encounter: Payer: Self-pay | Admitting: Obstetrics

## 2014-03-23 ENCOUNTER — Other Ambulatory Visit: Payer: Self-pay | Admitting: Obstetrics

## 2014-03-23 DIAGNOSIS — N760 Acute vaginitis: Secondary | ICD-10-CM

## 2014-03-23 DIAGNOSIS — B9689 Other specified bacterial agents as the cause of diseases classified elsewhere: Secondary | ICD-10-CM

## 2014-03-23 LAB — WET PREP BY MOLECULAR PROBE
Candida species: NEGATIVE
Gardnerella vaginalis: POSITIVE — AB
Trichomonas vaginosis: NEGATIVE

## 2014-03-23 LAB — HEPATITIS C ANTIBODY: HCV Ab: NEGATIVE

## 2014-03-23 LAB — HEPATITIS B SURFACE ANTIGEN: Hepatitis B Surface Ag: NEGATIVE

## 2014-03-23 LAB — RPR

## 2014-03-23 LAB — HIV ANTIBODY (ROUTINE TESTING W REFLEX): HIV 1&2 Ab, 4th Generation: NONREACTIVE

## 2014-03-23 MED ORDER — METRONIDAZOLE 500 MG PO TABS: 500.0000 mg | ORAL_TABLET | Freq: Two times a day (BID) | ORAL | Status: DC

## 2014-03-23 NOTE — Progress Notes (Signed)
Patient ID: Andrea Dean, female   DOB: [birth year], 28 y.o.   MRN: [id number]  Chief Complaint  Patient presents with  . Vaginitis    Patient states she is having vaginal irritation and burning after her menstrual cycles.     HPI Andrea Dean is a 28 y.o. female.  Vaginal burning and irritation.  HPI  Past Medical History  Diagnosis Date  . Abnormal Pap smear     repeat WNL  . Chlamydia   . Preterm labor   . Urinary tract infection   . MRSA (methicillin resistant staph aureus) culture positive   . Fracture closed, fibula, shaft 2007    left, no surgical repair  . Ovarian cyst   . Herpes genitalis in women     Past Surgical History  Procedure Laterality Date  . Cesarean section    . Wisdom tooth extraction  2004  . Tooth extraction      Family History  Problem Relation Age of Onset  . Hypertension Maternal Grandmother   . Stroke Maternal Grandmother   . Cancer Paternal Grandmother     brain, lung  . Diabetes Paternal Grandmother   . Heart disease Paternal Grandmother   . Hypertension Paternal Grandmother   . Anesthesia problems Neg Hx   . Hypotension Neg Hx   . Malignant hyperthermia Neg Hx   . Pseudochol deficiency Neg Hx     Social History History  Substance Use Topics  . Smoking status: Never Smoker   . Smokeless tobacco: Never Used  . Alcohol Use: No     Comment: occ    Allergies  Allergen Reactions  . Flagyl [Metronidazole Hcl] Hives and Nausea And Vomiting  . Latex     sensitive    Current Outpatient Prescriptions  Medication Sig Dispense Refill  . ibuprofen (ADVIL,MOTRIN) 800 MG tablet Take 800 mg by mouth every 8 (eight) hours as needed.      . famotidine (PEPCID) 20 MG tablet Take 1 tablet (20 mg total) by mouth 2 (two) times daily.  30 tablet  0  . fexofenadine (ALLEGRA) 180 MG tablet Take 1 tablet (180 mg total) by mouth daily.  30 tablet  1  . fluticasone (FLONASE) 50 MCG/ACT nasal spray Place 1 spray into the nose 2 (two) times daily.   1 g  2  . metroNIDAZOLE (FLAGYL) 500 MG tablet Take 1 tablet (500 mg total) by mouth 2 (two) times daily.  14 tablet  2  . valACYclovir (VALTREX) 1000 MG tablet Take 1 tablet po daily for suppression.  30 tablet  11   No current facility-administered medications for this visit.    Review of Systems Review of Systems Constitutional: negative for fatigue and weight loss Respiratory: negative for cough and wheezing Cardiovascular: negative for chest pain, fatigue and palpitations Gastrointestinal: negative for abdominal pain and change in bowel habits Genitourinary:negative Integument/breast: negative for nipple discharge Musculoskeletal:negative for myalgias Neurological: negative for gait problems and tremors Behavioral/Psych: negative for abusive relationship, depression Endocrine: negative for temperature intolerance     Blood pressure 122/82, pulse 74, temperature 97.4 F (36.3 C), height 5' 2.5" (1.588 m), weight 128 lb (58.06 kg), last menstrual period 03/10/2014.  Physical Exam Physical Exam General:   alert  Skin:   no rash or abnormalities  Lungs:   clear to auscultation bilaterally  Heart:   regular rate and rhythm, S1, S2 normal, no murmur, click, rub or gallop  Breasts:   normal without suspicious masses, skin or  nipple changes or axillary nodes  Abdomen:  normal findings: no organomegaly, soft, non-tender and no hernia  Pelvis:  External genitalia: normal general appearance Urinary system: urethral meatus normal and bladder without fullness, nontender Vaginal: normal without tenderness, induration or masses Cervix: normal appearance Adnexa: normal bimanual exam Uterus: anteverted and non-tender, normal size      Data Reviewed Labs  Assessment    Vaginitis     Plan   Wet prep and cultures done Orders Placed This Encounter  Procedures  . WET PREP BY MOLECULAR PROBE  . HIV antibody  . Hepatitis B surface antigen  . RPR  . Hepatitis C antibody   Meds  ordered this encounter  Medications  . valACYclovir (VALTREX) 1000 MG tablet    Sig: Take 1 tablet po daily for suppression.    Dispense:  30 tablet    Refill:  11       Barby Colvard A 03/23/2014, 12:51 PM

## 2014-03-30 ENCOUNTER — Other Ambulatory Visit: Payer: Self-pay | Admitting: *Deleted

## 2014-03-30 DIAGNOSIS — N760 Acute vaginitis: Secondary | ICD-10-CM

## 2014-03-30 MED ORDER — CLINDAMYCIN HCL 300 MG PO CAPS: 300.0000 mg | ORAL_CAPSULE | Freq: Two times a day (BID) | ORAL | Status: DC

## 2014-05-03 ENCOUNTER — Encounter (HOSPITAL_COMMUNITY): Payer: Self-pay | Admitting: Pharmacy Technician

## 2014-05-03 ENCOUNTER — Other Ambulatory Visit (INDEPENDENT_AMBULATORY_CARE_PROVIDER_SITE_OTHER): Payer: Self-pay | Admitting: General Surgery

## 2014-05-04 ENCOUNTER — Encounter (HOSPITAL_COMMUNITY)
Admission: RE | Admit: 2014-05-04 | Discharge: 2014-05-04 | Disposition: A | Discharge status: Home / Self Care | Payer: Medicaid Other | Source: Ambulatory Visit | Attending: General Surgery | Admitting: General Surgery

## 2014-05-04 ENCOUNTER — Encounter (HOSPITAL_COMMUNITY): Payer: Self-pay

## 2014-05-04 DIAGNOSIS — L0591 Pilonidal cyst without abscess: Secondary | ICD-10-CM | POA: Diagnosis not present

## 2014-05-04 DIAGNOSIS — Z881 Allergy status to other antibiotic agents status: Secondary | ICD-10-CM | POA: Diagnosis not present

## 2014-05-04 DIAGNOSIS — Z8614 Personal history of Methicillin resistant Staphylococcus aureus infection: Secondary | ICD-10-CM | POA: Diagnosis not present

## 2014-05-04 DIAGNOSIS — K219 Gastro-esophageal reflux disease without esophagitis: Secondary | ICD-10-CM | POA: Diagnosis not present

## 2014-05-04 DIAGNOSIS — Z91040 Latex allergy status: Secondary | ICD-10-CM | POA: Diagnosis not present

## 2014-05-04 HISTORY — DX: Thrombocytopenia, unspecified: D69.6

## 2014-05-04 HISTORY — DX: Allergy status to unspecified drugs, medicaments and biological substances: Z88.9

## 2014-05-04 HISTORY — DX: Gastro-esophageal reflux disease without esophagitis: K21.9

## 2014-05-04 LAB — CBC
HCT: 38 % (ref 36.0–46.0)
Hemoglobin: 12.9 g/dL (ref 12.0–15.0)
MCH: 33.2 pg (ref 26.0–34.0)
MCHC: 33.9 g/dL (ref 30.0–36.0)
MCV: 97.7 fL (ref 78.0–100.0)
Platelets: 321 10*3/uL (ref 150–400)
RBC: 3.89 MIL/uL (ref 3.87–5.11)
RDW: 11.9 % (ref 11.5–15.5)
WBC: 6 10*3/uL (ref 4.0–10.5)

## 2014-05-04 LAB — HCG, SERUM, QUALITATIVE: Preg, Serum: NEGATIVE

## 2014-05-04 NOTE — Patient Instructions (Signed)
[facility]  ON:   TOMORROW - Friday  10/16  REPORT TO  SHORT STAY CENTER AT:  12:00 PM   DO NOT EAT  ANYTHING AFTER MIDNIGHT TONIGHT  ( THE NIGHT BEFORE YOUR SURGERY).   NO FOOD, NO CHEWING GUM, NO MINTS, NO CANDIES, NO CHEWING TOBACCO YOU MAY HAVE CLEAR LIQUIDS TO DRINK FROM MIDNIGHT TONIGHT - UNTIL 8:00 AM DAY OF YOUR SURGERY - LIKE WATER,.SODA, APPLE JUICE.  NOTHING TO DRINK AFTER 8:00 AM DAY OF SURGERY.  PLEASE TAKE THE FOLLOWING MEDICATIONS THE AM OF YOUR SURGERY WITH A FEW SIPS OF WATER:  PEPCID, VALTREX.    DO NOT BRING VALUABLES, MONEY, CREDIT CARDS.  DO NOT WEAR JEWELRY, MAKE-UP, NAIL POLISH AND NO METAL PINS OR CLIPS IN YOUR HAIR. CONTACT LENS, DENTURES / PARTIALS, GLASSES SHOULD NOT BE WORN TO SURGERY AND IN MOST CASES-HEARING AIDS WILL NEED TO BE REMOVED.  BRING YOUR GLASSES CASE, ANY EQUIPMENT NEEDED FOR YOUR CONTACT LENS. FOR PATIENTS ADMITTED TO THE HOSPITAL--CHECK OUT TIME THE DAY OF DISCHARGE IS 11:00 AM.  ALL INPATIENT ROOMS ARE PRIVATE - WITH BATHROOM, TELEPHONE, TELEVISION AND WIFI INTERNET.  IF YOU ARE BEING DISCHARGED THE SAME DAY OF YOUR SURGERY--YOU CAN NOT DRIVE YOURSELF HOME--AND SHOULD NOT GO HOME ALONE BY TAXI OR BUS.  NO DRIVING OR OPERATING MACHINERY, OR MAKING LEGAL DECISIONS FOR 24 HOURS FOLLOWING ANESTHESIA / PAIN MEDICATIONS.  PLEASE MAKE ARRANGEMENTS FOR SOMEONE TO BE WITH YOU AT HOME THE FIRST 24 HOURS AFTER SURGERY. RESPONSIBLE DRIVER'S NAME / PHONE   GRACON Villella  [phone]                                                     PLEASE BE AWARE THAT YOU MAY NEED ADDITIONAL BLOOD DRAWN DAY OF YOUR SURGERY  _______________________________________________________________________   [facility] Before surgery, you can play an important role.  Because skin is not sterile, your skin needs to be as free of germs as possible.  You can reduce the number of germs on your skin by washing with  CHG (chlorahexidine gluconate) soap before surgery.  CHG is an antiseptic cleaner which kills germs and bonds with the skin to continue killing germs even after washing. Please DO NOT use if you have an allergy to CHG or antibacterial soaps.  If your skin becomes reddened/irritated stop using the CHG and inform your nurse when you arrive at Short Stay. Do not shave (including legs and underarms) for at least 48 hours prior to the first CHG shower.  You may shave your face/neck. Please follow these instructions carefully:  1.  Shower with CHG Soap the night before surgery and the  morning of Surgery.  2.  If you choose to wash your hair, wash your hair first as usual with your  normal  shampoo.  3.  After you shampoo, rinse your hair and body thoroughly to remove the  shampoo.                           4.  Use CHG as you would any other liquid soap.  You can apply chg directly  to the skin and wash  Gently with a scrungie or clean washcloth.  5.  Apply the CHG Soap to your body ONLY FROM THE NECK DOWN.   Do not use on face/ open                           Wound or open sores. Avoid contact with eyes, ears mouth and genitals (private parts).                       Wash face,  Genitals (private parts) with your normal soap.             6.  Wash thoroughly, paying special attention to the area where your surgery  will be performed.  7.  Thoroughly rinse your body with warm water from the neck down.  8.  DO NOT shower/wash with your normal soap after using and rinsing off  the CHG Soap.                9.  Pat yourself dry with a clean towel.            10.  Wear clean pajamas.            11.  Place clean sheets on your bed the night of your first shower and do not  sleep with pets. Day of Surgery : Do not apply any lotions/deodorants the morning of surgery.  Please wear clean clothes to the hospital/surgery center.  FAILURE TO FOLLOW THESE INSTRUCTIONS MAY RESULT IN THE CANCELLATION  OF YOUR SURGERY PATIENT SIGNATURE_________________________________  NURSE SIGNATURE__________________________________  ________________________________________________________________________

## 2014-05-04 NOTE — Pre-Procedure Instructions (Signed)
CXR AND EKG NOT NEEDED PREOP PER ANESTHESIOLOGIST'S GUIDELINES. 

## 2014-05-05 ENCOUNTER — Ambulatory Visit (HOSPITAL_COMMUNITY)
Admission: RE | Admit: 2014-05-05 | Discharge: 2014-05-05 | Disposition: A | Discharge status: Home / Self Care | Payer: Medicaid Other | Source: Ambulatory Visit | Attending: General Surgery | Admitting: General Surgery

## 2014-05-05 ENCOUNTER — Encounter (HOSPITAL_COMMUNITY)
Admission: RE | Disposition: A | Discharge status: Home / Self Care | Payer: Self-pay | Source: Ambulatory Visit | Attending: General Surgery

## 2014-05-05 ENCOUNTER — Encounter (HOSPITAL_COMMUNITY): Payer: Self-pay | Admitting: *Deleted

## 2014-05-05 ENCOUNTER — Encounter (HOSPITAL_COMMUNITY): Payer: Medicaid Other | Admitting: Anesthesiology

## 2014-05-05 ENCOUNTER — Ambulatory Visit (HOSPITAL_COMMUNITY): Payer: Medicaid Other | Admitting: Anesthesiology

## 2014-05-05 DIAGNOSIS — Z8614 Personal history of Methicillin resistant Staphylococcus aureus infection: Secondary | ICD-10-CM | POA: Diagnosis not present

## 2014-05-05 DIAGNOSIS — L0591 Pilonidal cyst without abscess: Secondary | ICD-10-CM | POA: Diagnosis not present

## 2014-05-05 DIAGNOSIS — Z91040 Latex allergy status: Secondary | ICD-10-CM | POA: Insufficient documentation

## 2014-05-05 DIAGNOSIS — Z881 Allergy status to other antibiotic agents status: Secondary | ICD-10-CM | POA: Insufficient documentation

## 2014-05-05 DIAGNOSIS — K219 Gastro-esophageal reflux disease without esophagitis: Secondary | ICD-10-CM | POA: Insufficient documentation

## 2014-05-05 HISTORY — PX: PILONIDAL CYST EXCISION: SHX744

## 2014-05-05 SURGERY — EXCISION, PILONIDAL CYST, EXTENSIVE
Anesthesia: General | Site: Buttocks

## 2014-05-05 MED ORDER — KETOROLAC TROMETHAMINE 30 MG/ML IJ SOLN
INTRAMUSCULAR | Status: AC
  Filled 2014-05-05: qty 1

## 2014-05-05 MED ORDER — MIDAZOLAM HCL 2 MG/2ML IJ SOLN
INTRAMUSCULAR | Status: AC
  Filled 2014-05-05: qty 2

## 2014-05-05 MED ORDER — MEPERIDINE HCL 50 MG/ML IJ SOLN: 6.2500 mg | INTRAMUSCULAR | Status: DC | PRN

## 2014-05-05 MED ORDER — LIDOCAINE HCL 1 % IJ SOLN
INTRAMUSCULAR | Status: DC | PRN
  Administered 2014-05-05: 80 mg via INTRADERMAL

## 2014-05-05 MED ORDER — GLYCOPYRROLATE 0.2 MG/ML IJ SOLN
INTRAMUSCULAR | Status: AC
  Filled 2014-05-05: qty 2

## 2014-05-05 MED ORDER — CISATRACURIUM BESYLATE 20 MG/10ML IV SOLN
INTRAVENOUS | Status: AC
  Filled 2014-05-05: qty 10

## 2014-05-05 MED ORDER — ACETAMINOPHEN 650 MG RE SUPP
650.0000 mg | RECTAL | Status: DC | PRN
  Filled 2014-05-05: qty 1

## 2014-05-05 MED ORDER — SUCCINYLCHOLINE CHLORIDE 20 MG/ML IJ SOLN
INTRAMUSCULAR | Status: DC | PRN
  Administered 2014-05-05: 100 mg via INTRAVENOUS

## 2014-05-05 MED ORDER — PROMETHAZINE HCL 25 MG/ML IJ SOLN: 6.2500 mg | INTRAMUSCULAR | Status: DC | PRN

## 2014-05-05 MED ORDER — KETOROLAC TROMETHAMINE 30 MG/ML IJ SOLN
30.0000 mg | Freq: Four times a day (QID) | INTRAMUSCULAR | Status: DC
  Administered 2014-05-05: 30 mg via INTRAVENOUS

## 2014-05-05 MED ORDER — PROPOFOL 10 MG/ML IV BOLUS
INTRAVENOUS | Status: AC
  Filled 2014-05-05: qty 20

## 2014-05-05 MED ORDER — ACETAMINOPHEN 325 MG PO TABS: 650.0000 mg | ORAL_TABLET | ORAL | Status: DC | PRN

## 2014-05-05 MED ORDER — FENTANYL CITRATE 0.05 MG/ML IJ SOLN
25.0000 ug | INTRAMUSCULAR | Status: DC | PRN
  Administered 2014-05-05 (×4): 50 ug via INTRAVENOUS

## 2014-05-05 MED ORDER — SODIUM CHLORIDE 0.9 % IJ SOLN: 3.0000 mL | Freq: Two times a day (BID) | INTRAMUSCULAR | Status: DC

## 2014-05-05 MED ORDER — PROPOFOL 10 MG/ML IV BOLUS
INTRAVENOUS | Status: DC | PRN
  Administered 2014-05-05: 160 mg via INTRAVENOUS

## 2014-05-05 MED ORDER — SODIUM CHLORIDE 0.9 % IJ SOLN: 3.0000 mL | INTRAMUSCULAR | Status: DC | PRN

## 2014-05-05 MED ORDER — OXYCODONE HCL 5 MG PO TABS
5.0000 mg | ORAL_TABLET | ORAL | Status: DC | PRN
  Administered 2014-05-05: 5 mg via ORAL
  Filled 2014-05-05: qty 1

## 2014-05-05 MED ORDER — NEOSTIGMINE METHYLSULFATE 10 MG/10ML IV SOLN
INTRAVENOUS | Status: AC
  Filled 2014-05-05: qty 1

## 2014-05-05 MED ORDER — FENTANYL CITRATE 0.05 MG/ML IJ SOLN
INTRAMUSCULAR | Status: AC
  Administered 2014-05-05: 50 ug via INTRAVENOUS
  Filled 2014-05-05: qty 2

## 2014-05-05 MED ORDER — 0.9 % SODIUM CHLORIDE (POUR BTL) OPTIME
TOPICAL | Status: DC | PRN
  Administered 2014-05-05: 1000 mL

## 2014-05-05 MED ORDER — LACTATED RINGERS IV SOLN: INTRAVENOUS | Status: DC

## 2014-05-05 MED ORDER — FENTANYL CITRATE 0.05 MG/ML IJ SOLN: 50.0000 ug | Freq: Once | INTRAMUSCULAR | Status: DC

## 2014-05-05 MED ORDER — SODIUM CHLORIDE 0.9 % IV SOLN: 250.0000 mL | INTRAVENOUS | Status: DC | PRN

## 2014-05-05 MED ORDER — FENTANYL CITRATE 0.05 MG/ML IJ SOLN
INTRAMUSCULAR | Status: AC
  Filled 2014-05-05: qty 5

## 2014-05-05 MED ORDER — ONDANSETRON HCL 4 MG/2ML IJ SOLN
INTRAMUSCULAR | Status: AC
  Filled 2014-05-05: qty 2

## 2014-05-05 MED ORDER — BUPIVACAINE-EPINEPHRINE (PF) 0.25% -1:200000 IJ SOLN
INTRAMUSCULAR | Status: AC
  Filled 2014-05-05: qty 30

## 2014-05-05 MED ORDER — BUPIVACAINE-EPINEPHRINE (PF) 0.25% -1:200000 IJ SOLN
INTRAMUSCULAR | Status: DC | PRN
  Administered 2014-05-05: 20 mL

## 2014-05-05 MED ORDER — FENTANYL CITRATE 0.05 MG/ML IJ SOLN
INTRAMUSCULAR | Status: DC | PRN
  Administered 2014-05-05: 50 ug via INTRAVENOUS
  Administered 2014-05-05: 100 ug via INTRAVENOUS
  Administered 2014-05-05 (×2): 50 ug via INTRAVENOUS

## 2014-05-05 MED ORDER — NEOSTIGMINE METHYLSULFATE 10 MG/10ML IV SOLN
INTRAVENOUS | Status: DC | PRN
  Administered 2014-05-05: 4 mg via INTRAVENOUS

## 2014-05-05 MED ORDER — FENTANYL CITRATE 0.05 MG/ML IJ SOLN
INTRAMUSCULAR | Status: DC
Start: 2014-05-05 — End: 2014-05-05
  Filled 2014-05-05: qty 2

## 2014-05-05 MED ORDER — LACTATED RINGERS IV SOLN
INTRAVENOUS | Status: DC
  Administered 2014-05-05: 15:00:00 via INTRAVENOUS
  Administered 2014-05-05: 1000 mL via INTRAVENOUS

## 2014-05-05 MED ORDER — MIDAZOLAM HCL 5 MG/5ML IJ SOLN
INTRAMUSCULAR | Status: DC | PRN
  Administered 2014-05-05: 2 mg via INTRAVENOUS

## 2014-05-05 MED ORDER — LIDOCAINE HCL (CARDIAC) 20 MG/ML IV SOLN
INTRAVENOUS | Status: AC
  Filled 2014-05-05: qty 5

## 2014-05-05 MED ORDER — GLYCOPYRROLATE 0.2 MG/ML IJ SOLN
INTRAMUSCULAR | Status: DC | PRN
  Administered 2014-05-05: 0.4 mg via INTRAVENOUS

## 2014-05-05 MED ORDER — SCOPOLAMINE 1 MG/3DAYS TD PT72
MEDICATED_PATCH | TRANSDERMAL | Status: AC
  Filled 2014-05-05: qty 1

## 2014-05-05 MED ORDER — OXYCODONE HCL 5 MG PO TABS: 5.0000 mg | ORAL_TABLET | Freq: Four times a day (QID) | ORAL | Status: DC | PRN

## 2014-05-05 MED ORDER — CISATRACURIUM BESYLATE (PF) 10 MG/5ML IV SOLN
INTRAVENOUS | Status: DC | PRN
  Administered 2014-05-05: 4 mg via INTRAVENOUS

## 2014-05-05 MED ORDER — DEXTROSE 5 % IV SOLN
2.0000 g | INTRAVENOUS | Status: AC
  Administered 2014-05-05: 2 g via INTRAVENOUS

## 2014-05-05 MED ORDER — ONDANSETRON HCL 4 MG/2ML IJ SOLN
INTRAMUSCULAR | Status: DC | PRN
  Administered 2014-05-05: 4 mg via INTRAVENOUS

## 2014-05-05 SURGICAL SUPPLY — 39 items
ADH SKN CLS APL DERMABOND .7 (GAUZE/BANDAGES/DRESSINGS) ×1
APL SKNCLS STERI-STRIP NONHPOA (GAUZE/BANDAGES/DRESSINGS) ×1
BENZOIN TINCTURE PRP APPL 2/3 (GAUZE/BANDAGES/DRESSINGS) ×1 IMPLANT
BLADE HEX COATED 2.75 (ELECTRODE) ×2 IMPLANT
BLADE SURG SZ10 CARB STEEL (BLADE) ×2 IMPLANT
CANISTER SUCTION 2500CC (MISCELLANEOUS) ×2 IMPLANT
DECANTER SPIKE VIAL GLASS SM (MISCELLANEOUS) IMPLANT
DERMABOND ADVANCED (GAUZE/BANDAGES/DRESSINGS) ×1
DERMABOND ADVANCED .7 DNX12 (GAUZE/BANDAGES/DRESSINGS) IMPLANT
DRAIN PENROSE .75X.25X12 SILI (WOUND CARE) ×1 IMPLANT
DRAPE LAPAROTOMY TRNSV 102X78 (DRAPE) IMPLANT
ELECT REM PT RETURN 9FT ADLT (ELECTROSURGICAL) ×2
ELECTRODE REM PT RTRN 9FT ADLT (ELECTROSURGICAL) ×1 IMPLANT
GAUZE PACKING IODOFORM 1/4X15 (GAUZE/BANDAGES/DRESSINGS) IMPLANT
GAUZE SPONGE 4X4 12PLY STRL (GAUZE/BANDAGES/DRESSINGS) ×2 IMPLANT
GLOVE BIO SURGEON STRL SZ 6.5 (GLOVE) ×2 IMPLANT
GLOVE BIOGEL PI IND STRL 7.0 (GLOVE) ×1 IMPLANT
GLOVE BIOGEL PI INDICATOR 7.0 (GLOVE) ×2
GOWN L4 XXLG W/PAP TWL (GOWN DISPOSABLE) ×2 IMPLANT
GOWN SPEC L4 XLG W/TWL (GOWN DISPOSABLE) ×3 IMPLANT
KIT BASIN OR (CUSTOM PROCEDURE TRAY) ×2 IMPLANT
MARKER SKIN DUAL TIP RULER LAB (MISCELLANEOUS) IMPLANT
NDL HYPO 25X1 1.5 SAFETY (NEEDLE) ×1 IMPLANT
NEEDLE HYPO 25X1 1.5 SAFETY (NEEDLE) ×2 IMPLANT
NS IRRIG 1000ML POUR BTL (IV SOLUTION) IMPLANT
PACK BASIC VI WITH GOWN DISP (CUSTOM PROCEDURE TRAY) ×2 IMPLANT
PENCIL BUTTON HOLSTER BLD 10FT (ELECTRODE) ×2 IMPLANT
SOL PREP POV-IOD 4OZ 10% (MISCELLANEOUS) ×2 IMPLANT
SPONGE LAP 18X18 X RAY DECT (DISPOSABLE) ×2 IMPLANT
STAPLER VISISTAT 35W (STAPLE) ×2 IMPLANT
SUT ETHILON 2 0 PS N (SUTURE) ×2 IMPLANT
SUT MNCRL AB 4-0 PS2 18 (SUTURE) IMPLANT
SUT VIC AB 2-0 SH 18 (SUTURE) ×4 IMPLANT
SUT VIC AB 3-0 SH 18 (SUTURE) IMPLANT
SYR BULB IRRIGATION 50ML (SYRINGE) ×1 IMPLANT
SYR CONTROL 10ML LL (SYRINGE) ×2 IMPLANT
TOWEL OR 17X26 10 PK STRL BLUE (TOWEL DISPOSABLE) ×2 IMPLANT
WATER STERILE IRR 1500ML POUR (IV SOLUTION) IMPLANT
YANKAUER SUCT BULB TIP 10FT TU (MISCELLANEOUS) ×1 IMPLANT

## 2014-05-05 NOTE — Anesthesia Preprocedure Evaluation (Signed)
Anesthesia Evaluation  Patient identified by MRN, date of birth, ID band Patient awake    Reviewed: Allergy & Precautions, H&P , NPO status , Patient's Chart, lab work & pertinent test results  Airway Mallampati: II TM Distance: >3 FB Neck ROM: Full    Dental no notable dental hx.    Pulmonary neg pulmonary ROS,  breath sounds clear to auscultation  Pulmonary exam normal       Cardiovascular negative cardio ROS  Rhythm:Regular Rate:Normal     Neuro/Psych negative neurological ROS  negative psych ROS   GI/Hepatic Neg liver ROS, GERD-  Controlled and Medicated,  Endo/Other  negative endocrine ROS  Renal/GU negative Renal ROS  negative genitourinary   Musculoskeletal negative musculoskeletal ROS (+)   Abdominal   Peds negative pediatric ROS (+)  Hematology negative hematology ROS (+)   Anesthesia Other Findings   Reproductive/Obstetrics negative OB ROS                           Anesthesia Physical Anesthesia Plan  ASA: II  Anesthesia Plan: General   Post-op Pain Management:    Induction: Intravenous  Airway Management Planned: LMA and Oral ETT  Additional Equipment:   Intra-op Plan:   Post-operative Plan: Extubation in OR  Informed Consent: I have reviewed the patients History and Physical, chart, labs and discussed the procedure including the risks, benefits and alternatives for the proposed anesthesia with the patient or authorized representative who has indicated his/her understanding and acceptance.   Dental advisory given  Plan Discussed with: CRNA  Anesthesia Plan Comments:         Anesthesia Quick Evaluation

## 2014-05-05 NOTE — Discharge Instructions (Signed)
GENERAL SURGERY: POST OP INSTRUCTIONS  1. DIET: Follow a light bland diet the first 24 hours after arrival home, such as soup, liquids, crackers, etc.  Be sure to include lots of fluids daily.  Avoid fast food or heavy meals as your are more likely to get nauseated.   2. Take your usually prescribed home medications unless otherwise directed. 3. PAIN CONTROL: a. Pain is best controlled by a usual combination of three different methods TOGETHER: i. Ice/Heat ii. Over the counter pain medication iii. Prescription pain medication b. Most patients will experience some swelling and bruising around the incisions.  Ice packs or heating pads (30-60 minutes up to 6 times a day) will help. Use ice for the first few days to help decrease swelling and bruising, then switch to heat to help relax tight/sore spots and speed recovery.  Some people prefer to use ice alone, heat alone, alternating between ice & heat.  Experiment to what works for you.  Swelling and bruising can take several weeks to resolve.   c. It is helpful to take an over-the-counter pain medication regularly for the first few weeks.  Choose one of the following that works best for you: i. Naproxen (Aleve, etc)  Two 220mg  tabs twice a day ii. Ibuprofen (Advil, etc) Three 200mg  tabs four times a day (every meal & bedtime) d. A  prescription for pain medication (such as Percocet, oxycodone, hydrocodone, etc) should be given to you upon discharge.  Take your pain medication as prescribed.  i. If you are having problems/concerns with the prescription medicine (does not control pain, nausea, vomiting, rash, itching, etc), please call us [phone] to see if we need to switch you to a different pain medicine that will work better for you and/or control your side effect better. ii. If you need a refill on your pain medication, please contact your pharmacy.  They will contact our office to request authorization. Prescriptions will not be filled after 5  pm or on week-ends. 4. Avoid getting constipated.  Between the surgery and the pain medications, it is common to experience some constipation.  Increasing fluid intake and taking a fiber supplement (such as Metamucil, Citrucel, FiberCon, MiraLax, etc) 1-2 times a day regularly will usually help prevent this problem from occurring.  A mild laxative (prune juice, Milk of Magnesia, MiraLax, etc) should be taken according to package directions if there are no bowel movements after 48 hours.   5. Wash / shower every day.  You may shower over the incision but avoid tub baths.  Change the dressing at least once a day. 6. There is a rubber drain coming out of the top of your wound.  It will be removed at the office in 1 week.   7. ACTIVITIES as tolerated:   a. You may resume regular (light) daily activities beginning the next day--such as daily self-care, walking, climbing stairs--gradually increasing activities as tolerated.  If you can walk 30 minutes without difficulty, it is safe to try more intense activity such as jogging, treadmill, bicycling, low-impact aerobics, swimming, etc. b. Save the most intensive and strenuous activity for last such as sit-ups, heavy lifting, contact sports, etc  Refrain from any heavy lifting or straining until you are off narcotics for pain control.   c. DO NOT PUSH THROUGH PAIN.  Let pain be your guide: If it hurts to do something, don't do it.  Pain is your body warning you to avoid that activity for another week until the  pain goes down. d. You may drive when you are no longer taking prescription pain medication, you can comfortably wear a seatbelt, and you can safely maneuver your car and apply brakes. e. Bonita QuinYou may have sexual intercourse when it is comfortable.  8. FOLLOW UP in our office a. Please call [facility] at [phone] to set up an appointment to see your surgeon in the office for a follow-up appointment approximately 2-3 weeks after your surgery. b. Make sure that you  call for this appointment the day you arrive home to insure a convenient appointment time. 9. IF YOU HAVE DISABILITY OR FAMILY LEAVE FORMS, BRING THEM TO THE OFFICE FOR PROCESSING.  DO NOT GIVE THEM TO YOUR DOCTOR.   WHEN TO CALL US [phone]: 1. Poor pain control 2. Reactions / problems with new medications (rash/itching, nausea, etc)  3. Fever over 101.5 F (38.5 C) 4. Worsening swelling or bruising 5. Continued bleeding from incision. 6. Increased pain, redness, or drainage from the incision   The clinic staff is available to answer your questions during regular business hours (8:30am-5pm).  Please dont hesitate to call and ask to speak to one of our nurses for clinical concerns.   If you have a medical emergency, go to the nearest emergency room or call 911.  A surgeon from [facility] is always on call at the [facility]   [facility], [place] [address], [address], [place], [place]  [address] ? MAIN: [phone] ? TOLL FREE: [phone] ?  FAX [phone] [email or web address]

## 2014-05-05 NOTE — H&P (Signed)
Andrea Dean is a 28 y.o. F with recurrent pilonidal infections.    Past Medical History  Diagnosis Date  . Abnormal Pap smear     repeat WNL  . Chlamydia   . Preterm labor   . Urinary tract infection   . MRSA (methicillin resistant staph aureus) culture positive   . Fracture closed, fibula, shaft 2007    left, no surgical repair  . Ovarian cyst   . Herpes genitalis in women   . H/O seasonal allergies   . GERD (gastroesophageal reflux disease)   . Thrombocytopenia     2006 DURING PREGNANCY - NO PROBLEMS WITH DELIVERY OR ANY KNOWN PROBLEMS SINCE   Past Surgical History  Procedure Laterality Date  . Cesarean section    . Wisdom tooth extraction  2004  . I & d of piloniadal cyst in office - local - felt jittery afterwards       Medication List           amoxicillin-clavulanate 875-125 MG per tablet  Commonly known as:  AUGMENTIN  Take 1 tablet by mouth 2 (two) times daily.     cycloSPORINE 0.05 % ophthalmic emulsion  Commonly known as:  RESTASIS  Place 1 drop into both eyes 2 (two) times daily as needed (dry eyes.).     famotidine 20 MG tablet  Commonly known as:  PEPCID  Take 20 mg by mouth 2 (two) times daily as needed for heartburn or indigestion.     fexofenadine 180 MG tablet  Commonly known as:  ALLEGRA  Take 180 mg by mouth daily as needed for allergies or rhinitis.     fluticasone 50 MCG/ACT nasal spray  Commonly known as:  FLONASE  Place 1 spray into both nostrils 2 (two) times daily as needed for allergies or rhinitis.     ibuprofen 200 MG tablet  Commonly known as:  ADVIL,MOTRIN  Take 400-600 mg by mouth every 8 (eight) hours as needed for headache or mild pain.     PATADAY 0.2 % Soln  Generic drug:  Olopatadine HCl  Apply 1 drop to eye 2 (two) times daily as needed.     valACYclovir 1000 MG tablet  Commonly known as:  VALTREX  Take 1,000 mg by mouth every morning.       Allergies  Allergen Reactions  . Flagyl [Metronidazole Hcl] Hives and  Nausea And Vomiting  . Latex     sensitive   Family History  Problem Relation Age of Onset  . Hypertension Maternal Grandmother   . Stroke Maternal Grandmother   . Cancer Paternal Grandmother     brain, lung  . Diabetes Paternal Grandmother   . Heart disease Paternal Grandmother   . Hypertension Paternal Grandmother   . Anesthesia problems Neg Hx   . Hypotension Neg Hx   . Malignant hyperthermia Neg Hx   . Pseudochol deficiency Neg Hx    History   Social History  . Marital Status: Single    Spouse Name: N/A    Number of Children: N/A  . Years of Education: N/A   Occupational History  . Not on file.   Social History Main Topics  . Smoking status: Never Smoker   . Smokeless tobacco: Never Used  . Alcohol Use: No     Comment: occ  . Drug Use: No  . Sexual Activity: Not Currently    Birth Control/ Protection: None   Other Topics Concern  . Not on file   Social  History Narrative  . No narrative on file   Review of Systems - General ROS: negative for - fever Respiratory ROS: no cough, shortness of breath, or wheezing Cardiovascular ROS: no chest pain or dyspnea on exertion Gastrointestinal ROS: no abdominal pain, change in bowel habits, or black or bloody stools  BP 128/81  Pulse 87  Temp(Src) 97.8 F (36.6 C) (Oral)  Resp 18  Ht 5' 2.5" (1.588 m)  Wt 130 lb (58.968 kg)  BMI 23.38 kg/m2  SpO2 100%  LMP 05/05/2014 Gen: NAD CV: RRR Lungs: CTA Abd: soft, nontender  A/P:  Recurrent pilonidal disease  To OR for excision and repair.  Risks include bleeding, infection, chronic wound and recurrence.

## 2014-05-05 NOTE — Transfer of Care (Signed)
Immediate Anesthesia Transfer of Care Note  Patient: Andrea BelliniAriel C Jones  Procedure(s) Performed: Procedure(s):  EXCISION PILONIDAL CYST (N/A)  Patient Location: PACU  Anesthesia Type:General  Level of Consciousness: awake and alert   Airway & Oxygen Therapy: Patient connected to face mask oxygen  Post-op Assessment: Report given to PACU RN  Post vital signs: Reviewed and stable  Complications: No apparent anesthesia complications

## 2014-05-05 NOTE — Op Note (Signed)
05/05/2014  3:18 PM  PATIENT:  Andrea Dean  28 y.o. female  Patient Care Team: Dorrene GermanEdwin A Avbuere, MD as PCP - General (Internal Medicine)  PRE-OPERATIVE DIAGNOSIS:  pilonidal disease  POST-OPERATIVE DIAGNOSIS:  pilonidal disease  PROCEDURE:   EXCISION PILONIDAL DISEASE  SURGEON:  Surgeon(s): Romie LeveeAlicia Virgina Deakins, MD  ASSISTANT: none   ANESTHESIA:   local and general  SPECIMEN:  No Specimen  DISPOSITION OF SPECIMEN:  N/A  COUNTS:  YES  PLAN OF CARE: Discharge to home after PACU  PATIENT DISPOSITION:  PACU - hemodynamically stable.  INDICATION: A 28 year old female with recurrent pilonidal disease. She is here for excision.   OR FINDINGS: Large and deep pilonidal cavity (~6x6x8cm)  DESCRIPTION: the patient was identified in the preoperative holding area and taken to the OR where they were laid on the operating room table. Gen. anesthesia was induced without difficulty. The patient was then positioned in prone jackknife position with buttocks gently taped apart.  The patient was then prepped and draped in usual sterile fashion.  SCDs were noted to be in place prior to the initiation of anesthesia. A surgical timeout was performed indicating the correct patient, procedure, positioning and need for preoperative antibiotics.  I began by using a fistula probe to identify the pilonidal cavity. This was drawn out on the patient's skin using a marker. A lateral incision was made using a 10 blade scalpel. Dissection was carried down around the pilonidal cavity using electrocautery. The cavity did cross midline and extended all the way down to the periosteum. Once the entire cavity was excised I mobilized the subcutaneous tissues under the skin to create a flap and to lift the cleft upwards.  The pilonidal openings at the midline were excised using a 15 blade scalpel. This was closed using interrupted 2-0 Vicryl sutures.  A latex free Penrose drain was placed in the bottom of the cavity. The  subcutaneous tissue was closed in multiple layers using 2-0 Vicryl sutures over the Penrose drain.  After this was completed the skin was closed using 2-0 nylon sutures. The drain was fixed into place using a 2-0 nylon suture. Dermabond was placed over the midline pilonidal pit closure.  A sterile dressing was then applied. The patient was awakened from anesthesia and sent to the post anesthesia care unit in stable condition. All counts were correct per operating room staff.

## 2014-05-05 NOTE — Anesthesia Postprocedure Evaluation (Signed)
  Anesthesia Post-op Note  Patient: Andrea BelliniAriel C Jones  Procedure(s) Performed: Procedure(s) (LRB):  EXCISION PILONIDAL CYST (N/A)  Patient Location: PACU  Anesthesia Type: General  Level of Consciousness: awake and alert   Airway and Oxygen Therapy: Patient Spontanous Breathing  Post-op Pain: mild  Post-op Assessment: Post-op Vital signs reviewed, Patient's Cardiovascular Status Stable, Respiratory Function Stable, Patent Airway and No signs of Nausea or vomiting  Last Vitals:  Filed Vitals:   05/05/14 1631  BP: 106/59  Pulse: 80  Temp: 36.3 C  Resp: 15    Post-op Vital Signs: stable   Complications: No apparent anesthesia complications

## 2014-05-07 ENCOUNTER — Telehealth (INDEPENDENT_AMBULATORY_CARE_PROVIDER_SITE_OTHER): Payer: Self-pay | Admitting: General Surgery

## 2014-05-07 NOTE — Telephone Encounter (Signed)
She called stating she had a sore throat and some muscle aches.  No fever.  Small amount of drainage from the dressing.  I explained to her that the sore throat could be from the endotracheal tube and the muscle aches could be due to the positioning on the table.  It does not sound like she is having any significant complications from anesthesia.  I recommended she take Ibuprofen and her pain medication.  I also recommended she ambulate.

## 2014-05-08 ENCOUNTER — Encounter (HOSPITAL_COMMUNITY): Payer: Self-pay | Admitting: General Surgery

## 2014-05-11 ENCOUNTER — Ambulatory Visit: Payer: Medicaid Other | Admitting: Obstetrics

## 2014-05-22 ENCOUNTER — Encounter (HOSPITAL_COMMUNITY): Payer: Self-pay | Admitting: General Surgery

## 2014-06-13 ENCOUNTER — Ambulatory Visit (INDEPENDENT_AMBULATORY_CARE_PROVIDER_SITE_OTHER): Payer: Medicaid Other | Admitting: Obstetrics

## 2014-06-13 ENCOUNTER — Other Ambulatory Visit: Payer: Self-pay | Admitting: Obstetrics

## 2014-06-13 ENCOUNTER — Encounter: Payer: Self-pay | Admitting: Obstetrics

## 2014-06-13 VITALS — BP 120/79 | HR 73 | Temp 98.0°F | Ht 62.5 in | Wt 129.0 lb

## 2014-06-13 DIAGNOSIS — Z3169 Encounter for other general counseling and advice on procreation: Secondary | ICD-10-CM

## 2014-06-13 DIAGNOSIS — Z0000 Encounter for general adult medical examination without abnormal findings: Secondary | ICD-10-CM

## 2014-06-13 LAB — COMPREHENSIVE METABOLIC PANEL
ALT: 8 U/L (ref 0–35)
AST: 16 U/L (ref 0–37)
Albumin: 4.4 g/dL (ref 3.5–5.2)
Alkaline Phosphatase: 46 U/L (ref 39–117)
BUN: 8 mg/dL (ref 6–23)
CO2: 27 mEq/L (ref 19–32)
Calcium: 9.4 mg/dL (ref 8.4–10.5)
Chloride: 99 mEq/L (ref 96–112)
Creat: 0.74 mg/dL (ref 0.50–1.10)
Glucose, Bld: 77 mg/dL (ref 70–99)
Potassium: 4.1 mEq/L (ref 3.5–5.3)
Sodium: 135 mEq/L (ref 135–145)
Total Bilirubin: 0.4 mg/dL (ref 0.2–1.2)
Total Protein: 7.6 g/dL (ref 6.0–8.3)

## 2014-06-13 LAB — CBC WITH DIFFERENTIAL/PLATELET
Basophils Absolute: 0 10*3/uL (ref 0.0–0.1)
Basophils Relative: 0 % (ref 0–1)
Eosinophils Absolute: 0.1 10*3/uL (ref 0.0–0.7)
Eosinophils Relative: 2 % (ref 0–5)
HCT: 40.7 % (ref 36.0–46.0)
Hemoglobin: 13.5 g/dL (ref 12.0–15.0)
Lymphocytes Relative: 58 % — ABNORMAL HIGH (ref 12–46)
Lymphs Abs: 3.9 10*3/uL (ref 0.7–4.0)
MCH: 32.5 pg (ref 26.0–34.0)
MCHC: 33.2 g/dL (ref 30.0–36.0)
MCV: 98.1 fL (ref 78.0–100.0)
MPV: 11.2 fL (ref 9.4–12.4)
Monocytes Absolute: 0.3 10*3/uL (ref 0.1–1.0)
Monocytes Relative: 4 % (ref 3–12)
Neutro Abs: 2.4 10*3/uL (ref 1.7–7.7)
Neutrophils Relative %: 36 % — ABNORMAL LOW (ref 43–77)
Platelets: 319 10*3/uL (ref 150–400)
RBC: 4.15 MIL/uL (ref 3.87–5.11)
RDW: 13.1 % (ref 11.5–15.5)
WBC: 6.8 10*3/uL (ref 4.0–10.5)

## 2014-06-13 LAB — LIPID PANEL
Cholesterol: 170 mg/dL (ref 0–200)
HDL: 54 mg/dL (ref >39)
LDL Cholesterol: 108 mg/dL — ABNORMAL HIGH (ref 0–99)
Total CHOL/HDL Ratio: 3.1 Ratio
Triglycerides: 42 mg/dL (ref <150)
VLDL: 8 mg/dL (ref 0–40)

## 2014-06-13 LAB — TRIGLYCERIDES: Triglycerides: 42 mg/dL (ref <150)

## 2014-06-13 LAB — CHOLESTEROL, TOTAL: Cholesterol: 170 mg/dL (ref 0–200)

## 2014-06-13 LAB — TSH: TSH: 1.115 u[IU]/mL (ref 0.350–4.500)

## 2014-06-13 LAB — HDL CHOLESTEROL: HDL: 54 mg/dL (ref >39)

## 2014-06-13 MED ORDER — PNV PRENATAL PLUS MULTIVITAMIN 27-1 MG PO TABS: 1.0000 | ORAL_TABLET | Freq: Every day | ORAL | Status: DC

## 2014-06-13 NOTE — Progress Notes (Signed)
Subjective:     Andrea Dean is a 28 y.o. female here for a routine exam.  Current complaints:  Vaginal Irritation after period.  No odor or abnormal discharge.  Personal health questionnaire:  Is patient Ashkenazi Jewish, have a family history of breast and/or ovarian cancer: no Is there a family history of uterine cancer diagnosed at age < 650, gastrointestinal cancer, urinary tract cancer, family member who is a Personnel officerLynch syndrome-associated carrier: no Is the patient overweight and hypertensive, family history of diabetes, personal history of gestational diabetes or PCOS: no Is patient over 5255, have PCOS,  family history of premature CHD under age 28, diabetes, smoke, have hypertension or peripheral artery disease:  no At any time, has a partner hit, kicked or otherwise hurt or frightened you?: no Over the past 2 weeks, have you felt down, depressed or hopeless?: no Over the past 2 weeks, have you felt little interest or pleasure in doing things?:no   Gynecologic History Patient's last menstrual period was 06/03/2014. Contraception: abstinence Last Pap: 2014. Results were: normal Last mammogram: n/a. Results were: n/a  Obstetric History OB History  Gravida Para Term Preterm AB SAB TAB Ectopic Multiple Living  6 2 2  4 1 2 1  2     # Outcome Date GA Lbr Len/2nd Weight Sex Delivery Anes PTL Lv  6 Term 12/28/07     VBAC   Y     Comments: IOL for oligo & SGA per patient  5 Term 07/21/04     CS-Unspec   Y     Comments: c/section for failure to progress  4 SAB           3 TAB           2 TAB           1 Ectopic               Past Medical History  Diagnosis Date  . Abnormal Pap smear     repeat WNL  . Chlamydia   . Preterm labor   . Urinary tract infection   . MRSA (methicillin resistant staph aureus) culture positive   . Fracture closed, fibula, shaft 2007    left, no surgical repair  . Ovarian cyst   . Herpes genitalis in women   . H/O seasonal allergies   . GERD  (gastroesophageal reflux disease)   . Thrombocytopenia     2006 DURING PREGNANCY - NO PROBLEMS WITH DELIVERY OR ANY KNOWN PROBLEMS SINCE    Past Surgical History  Procedure Laterality Date  . Cesarean section    . Wisdom tooth extraction  2004  . I & d of piloniadal cyst in office - local - felt jittery afterwards    . Pilonidal cyst excision N/A 05/05/2014    Procedure:  EXCISION PILONIDAL CYST;  Surgeon: Romie LeveeAlicia Thomas, MD;  Location: [facility];  Service: General;  Laterality: N/A;    Current outpatient prescriptions: cycloSPORINE (RESTASIS) 0.05 % ophthalmic emulsion, Place 1 drop into both eyes 2 (two) times daily as needed (dry eyes.)., Disp: , Rfl: ;  famotidine (PEPCID) 20 MG tablet, Take 20 mg by mouth 2 (two) times daily as needed for heartburn or indigestion., Disp: , Rfl: ;  fexofenadine (ALLEGRA) 180 MG tablet, Take 180 mg by mouth daily as needed for allergies or rhinitis., Disp: , Rfl:  fluticasone (FLONASE) 50 MCG/ACT nasal spray, Place 1 spray into both nostrils 2 (two) times daily as needed for allergies or  rhinitis., Disp: , Rfl: ;  ibuprofen (ADVIL,MOTRIN) 200 MG tablet, Take 400-600 mg by mouth every 8 (eight) hours as needed for headache or mild pain., Disp: , Rfl: ;  Olopatadine HCl (PATADAY) 0.2 % SOLN, Apply 1 drop to eye 2 (two) times daily as needed. , Disp: , Rfl:  valACYclovir (VALTREX) 1000 MG tablet, Take 1,000 mg by mouth every morning., Disp: , Rfl: ;  Prenatal Vit-Fe Fumarate-FA (PNV PRENATAL PLUS MULTIVITAMIN) 27-1 MG TABS, Take 1 tablet by mouth daily before breakfast., Disp: 30 tablet, Rfl: 11 Allergies  Allergen Reactions  . Flagyl [Metronidazole Hcl] Hives and Nausea And Vomiting  . Latex     sensitive    History  Substance Use Topics  . Smoking status: Never Smoker   . Smokeless tobacco: Never Used  . Alcohol Use: No     Comment: occ    Family History  Problem Relation Age of Onset  . Hypertension Maternal Grandmother   . Stroke Maternal Grandmother    . Cancer Paternal Grandmother     brain, lung  . Diabetes Paternal Grandmother   . Heart disease Paternal Grandmother   . Hypertension Paternal Grandmother   . Anesthesia problems Neg Hx   . Hypotension Neg Hx   . Malignant hyperthermia Neg Hx   . Pseudochol deficiency Neg Hx       Review of Systems  Constitutional: negative for fatigue and weight loss Respiratory: negative for cough and wheezing Cardiovascular: negative for chest pain, fatigue and palpitations Gastrointestinal: negative for abdominal pain and change in bowel habits Musculoskeletal:negative for myalgias Neurological: negative for gait problems and tremors Behavioral/Psych: negative for abusive relationship, depression Endocrine: negative for temperature intolerance   Genitourinary:negative for abnormal menstrual periods, genital lesions, hot flashes, sexual problems and vaginal discharge Integument/breast: negative for breast lump, breast tenderness, nipple discharge and skin lesion(s)    Objective:       BP 120/79 mmHg  Pulse 73  Temp(Src) 98 F (36.7 C)  Ht 5' 2.5" (1.588 m)  Wt 129 lb (58.514 kg)  BMI 23.20 kg/m2  LMP 06/03/2014 General:   alert  Skin:   no rash or abnormalities  Lungs:   clear to auscultation bilaterally  Heart:   regular rate and rhythm, S1, S2 normal, no murmur, click, rub or gallop  Breasts:   normal without suspicious masses, skin or nipple changes or axillary nodes  Abdomen:  normal findings: no organomegaly, soft, non-tender and no hernia  Pelvis:  External genitalia: normal general appearance Urinary system: urethral meatus normal and bladder without fullness, nontender Vaginal: normal without tenderness, induration or masses Cervix: normal appearance Adnexa: normal bimanual exam Uterus: anteverted and non-tender, normal size   Lab Review Urine pregnancy test Labs reviewed yes Radiologic studies reviewed no    Assessment:    Healthy female exam.     Preconception counseling.  Planning pregnancy in a year.   Plan:   Prenatal Vitamins Rx   Education reviewed: low fat, low cholesterol diet and safe sex/STD prevention. Follow up in: 1 year.   Meds ordered this encounter  Medications  . Prenatal Vit-Fe Fumarate-FA (PNV PRENATAL PLUS MULTIVITAMIN) 27-1 MG TABS    Sig: Take 1 tablet by mouth daily before breakfast.    Dispense:  30 tablet    Refill:  11   Orders Placed This Encounter  Procedures  . WET PREP BY MOLECULAR PROBE  . GC/Chlamydia Probe Amp  . CBC with Differential  . Comprehensive metabolic panel  .  TSH  . Cholesterol, total  . Triglycerides  . HDL cholesterol  . HIV antibody  . Hepatitis B surface antigen  . RPR  . Hepatitis C antibody  . Lipid Profile

## 2014-06-14 LAB — RPR

## 2014-06-14 LAB — HIV ANTIBODY (ROUTINE TESTING W REFLEX): HIV 1&2 Ab, 4th Generation: NONREACTIVE

## 2014-06-14 LAB — HEPATITIS C ANTIBODY: HCV Ab: NEGATIVE

## 2014-06-14 LAB — WET PREP BY MOLECULAR PROBE
Candida species: NEGATIVE
Gardnerella vaginalis: NEGATIVE
Trichomonas vaginosis: NEGATIVE

## 2014-06-14 LAB — PAP IG W/ RFLX HPV ASCU

## 2014-06-14 LAB — GC/CHLAMYDIA PROBE AMP
CT Probe RNA: NEGATIVE
GC Probe RNA: NEGATIVE

## 2014-06-14 LAB — HEPATITIS B SURFACE ANTIGEN: Hepatitis B Surface Ag: NEGATIVE

## 2014-06-19 ENCOUNTER — Telehealth: Payer: Self-pay | Admitting: *Deleted

## 2014-06-19 NOTE — Telephone Encounter (Signed)
Pt called office for lab results. Return call to pt.  LM making her aware labs are WNL.  Advised pt to contact office if she has further questions.

## 2014-06-20 ENCOUNTER — Telehealth: Payer: Self-pay | Admitting: *Deleted

## 2014-06-20 NOTE — Telephone Encounter (Signed)
Pt called to office regarding lab results. Pt states that she missed call yesterday. Return call to pt.  LM on VM making pt aware that labs were WNL and to contact office if she has further concerns.

## 2014-06-23 ENCOUNTER — Telehealth: Payer: Self-pay | Admitting: *Deleted

## 2014-06-23 NOTE — Telephone Encounter (Signed)
Patient called in regards to her wet prep and pap smear results. Called patient and left a voicemail stating her labs are negative and have been released to my chart.

## 2014-08-21 ENCOUNTER — Ambulatory Visit (INDEPENDENT_AMBULATORY_CARE_PROVIDER_SITE_OTHER): Payer: Medicaid Other | Admitting: Obstetrics

## 2014-08-21 ENCOUNTER — Encounter: Payer: Self-pay | Admitting: Obstetrics

## 2014-08-21 VITALS — BP 132/88 | HR 95 | Temp 97.3°F | Ht 62.5 in | Wt 129.0 lb

## 2014-08-21 DIAGNOSIS — Z3169 Encounter for other general counseling and advice on procreation: Secondary | ICD-10-CM

## 2014-08-21 DIAGNOSIS — Z30011 Encounter for initial prescription of contraceptive pills: Secondary | ICD-10-CM

## 2014-08-21 MED ORDER — LEVONORGESTREL-ETHINYL ESTRAD 0.15-30 MG-MCG PO TABS: 1.0000 | ORAL_TABLET | Freq: Every day | ORAL | Status: DC

## 2014-08-21 MED ORDER — LEVONORGESTREL-ETHINYL ESTRAD 0.1-20 MG-MCG PO TABS: 1.0000 | ORAL_TABLET | Freq: Every day | ORAL | Status: DC

## 2014-08-21 NOTE — Progress Notes (Signed)
Subjective:    Andrea Dean is a 29 y.o. female who presents for contraception counseling. The patient has no complaints today. The patient is sexually active. Pertinent past medical history: none.  The information documented in the HPI was reviewed and verified.  Menstrual History: OB History    Gravida Para Term Preterm AB TAB SAB Ectopic Multiple Living   Patient's last menstrual period was 07/30/2014.   Patient Active Problem List   Diagnosis Date Noted  . 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 Vaginitis and vulvovaginitis, unspecified 03/22/2014  . Pilonidal abscess 11/07/2013  . 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 Abnormal Pap smear     repeat WNL  . Chlamydia   . Preterm labor   . Urinary tract infection   . MRSA (methicillin resistant staph aureus) culture positive   . Fracture closed, fibula, shaft 2007    left, no surgical repair  . Ovarian cyst   . Herpes genitalis in women   . H/O seasonal allergies   . GERD (gastroesophageal reflux disease)   . Thrombocytopenia     2006 DURING PREGNANCY - NO PROBLEMS WITH DELIVERY OR ANY KNOWN PROBLEMS SINCE    Past Surgical History  Procedure Laterality Date  . Cesarean section    . Wisdom tooth extraction  2004  . I & d of piloniadal cyst in office - local - felt jittery afterwards    . Pilonidal cyst excision N/A 05/05/2014    Procedure:  EXCISION PILONIDAL CYST;  Surgeon: Romie Levee, MD;  Location: [facility];  Service: General;  Laterality: N/A;     Current outpatient prescriptions:  .  cycloSPORINE (RESTASIS) 0.05 % ophthalmic emulsion, Place 1 drop into both eyes 2 (two) times daily as needed (dry eyes.)., Disp: , Rfl:  .  famotidine (PEPCID) 20 MG tablet, Take 20 mg by mouth 2 (two) times daily as needed for heartburn or indigestion., Disp: , Rfl:  .  fexofenadine (ALLEGRA) 180 MG tablet, Take 180 mg by mouth daily as needed for allergies or  rhinitis., Disp: , Rfl:  .  fluticasone (FLONASE) 50 MCG/ACT nasal spray, Place 1 spray into both nostrils 2 (two) times daily as needed for allergies or rhinitis., Disp: , Rfl:  .  ibuprofen (ADVIL,MOTRIN) 200 MG tablet, Take 400-600 mg by mouth every 8 (eight) hours as needed for headache or mild pain., Disp: , Rfl:  .  Olopatadine HCl (PATADAY) 0.2 % SOLN, Apply 1 drop to eye 2 (two) times daily as needed. , Disp: , Rfl:  .  Prenatal Vit-Fe Fumarate-FA (PNV PRENATAL PLUS MULTIVITAMIN) 27-1 MG TABS, Take 1 tablet by mouth daily before breakfast., Disp: 30 tablet, Rfl: 11 .  valACYclovir (VALTREX) 1000 MG tablet, Take 1,000 mg by mouth every morning., Disp: , Rfl:  .  levonorgestrel-ethinyl estradiol (NORDETTE) 0.15-30 MG-MCG tablet, Take 1 tablet by mouth daily., Disp: 1 Package, Rfl: 11 Allergies  Allergen Reactions  . Flagyl [Metronidazole Hcl] Hives and Nausea And Vomiting  . Latex     sensitive    History  Substance Use Topics  . Smoking status: Never Smoker   . Smokeless tobacco: Never Used  . Alcohol Use: No     Comment: occ    Family History  Problem Relation Age of Onset  . Hypertension Maternal Grandmother   . Stroke Maternal Grandmother   . Cancer  Paternal Grandmother     brain, lung  . Diabetes Paternal Grandmother   . Heart disease Paternal Grandmother   . Hypertension Paternal Grandmother   . Anesthesia problems Neg Hx   . Hypotension Neg Hx   . Malignant hyperthermia Neg Hx   . Pseudochol deficiency Neg Hx        Review of Systems Constitutional: negative for weight loss Genitourinary:negative for abnormal menstrual periods and vaginal discharge   Objective:   BP 132/88 mmHg  Pulse 95  Temp(Src) 97.3 F (36.3 C)  Ht 5' 2.5" (1.588 m)  Wt 129 lb (58.514 kg)  BMI 23.20 kg/m2  LMP 07/30/2014  PE:  Deferred   Lab Review Urine pregnancy test Labs reviewed yes Radiologic studies reviewed no  100% of 10 min visit spent on counseling and  coordination of care.   Assessment:    29 y.o., starting OCP (estrogen/progesterone), no contraindications.   Plan:   Nordette 28 Rx  All questions answered. Follow up as needed.  Meds ordered this encounter  Medications  . DISCONTD: levonorgestrel-ethinyl estradiol (AVIANE,ALESSE,LESSINA) 0.1-20 MG-MCG tablet    Sig: Take 1 tablet by mouth daily.    Dispense:  1 Package    Refill:  11  . levonorgestrel-ethinyl estradiol (NORDETTE) 0.15-30 MG-MCG tablet    Sig: Take 1 tablet by mouth daily.    Dispense:  1 Package    Refill:  11   No orders of the defined types were placed in this encounter.

## 2014-11-03 ENCOUNTER — Other Ambulatory Visit: Payer: Self-pay | Admitting: *Deleted

## 2014-11-03 DIAGNOSIS — Z3041 Encounter for surveillance of contraceptive pills: Secondary | ICD-10-CM

## 2014-11-03 MED ORDER — LEVONORGESTREL-ETHINYL ESTRAD 0.15-30 MG-MCG PO TABS: 1.0000 | ORAL_TABLET | Freq: Every day | ORAL | Status: DC

## 2014-11-03 NOTE — Progress Notes (Signed)
Patient called requesting a 90 day supply on her birth control. Patient states it's cheaper for her to have a 90 supply. Patient is not due for her annual exam until November 2016. Left message advising patient prescription has been sent to the pharmacy.

## 2014-11-21 ENCOUNTER — Ambulatory Visit (INDEPENDENT_AMBULATORY_CARE_PROVIDER_SITE_OTHER): Payer: Medicaid Other | Admitting: Obstetrics

## 2014-11-21 VITALS — BP 119/76 | HR 81 | Wt 131.6 lb

## 2014-11-21 DIAGNOSIS — N760 Acute vaginitis: Secondary | ICD-10-CM | POA: Diagnosis not present

## 2014-11-22 ENCOUNTER — Encounter: Payer: Self-pay | Admitting: Obstetrics

## 2014-11-22 MED ORDER — BUTOCONAZOLE NITRATE (1 DOSE) 2 % VA CREA: 1.0000 | TOPICAL_CREAM | Freq: Every evening | VAGINAL | Status: DC | PRN

## 2014-11-22 NOTE — Progress Notes (Signed)
Patient ID: Andrea Dean, female   DOB: [birth year], 29 y.o.   MRN: [id number]  Chief Complaint  Patient presents with  . Vaginitis    discuss symptoms after starting BC    HPI Andrea Belliniriel C Dean is a 29 y.o. female.  Vaginal itching and irritation after starting birth control.  HPI  Past Medical History  Diagnosis Date  . Abnormal Pap smear     repeat WNL  . Chlamydia   . Preterm labor   . Urinary tract infection   . MRSA (methicillin resistant staph aureus) culture positive   . Fracture closed, fibula, shaft 2007    left, no surgical repair  . Ovarian cyst   . Herpes genitalis in women   . H/O seasonal allergies   . GERD (gastroesophageal reflux disease)   . Thrombocytopenia     2006 DURING PREGNANCY - NO PROBLEMS WITH DELIVERY OR ANY KNOWN PROBLEMS SINCE    Past Surgical History  Procedure Laterality Date  . Cesarean section    . Wisdom tooth extraction  2004  . I & d of piloniadal cyst in office - local - felt jittery afterwards    . Pilonidal cyst excision N/A 05/05/2014    Procedure:  EXCISION PILONIDAL CYST;  Surgeon: Romie LeveeAlicia Thomas, MD;  Location: [facility];  Service: General;  Laterality: N/A;    Family History  Problem Relation Age of Onset  . Hypertension Maternal Grandmother   . Stroke Maternal Grandmother   . Cancer Paternal Grandmother     brain, lung  . Diabetes Paternal Grandmother   . Heart disease Paternal Grandmother   . Hypertension Paternal Grandmother   . Anesthesia problems Neg Hx   . Hypotension Neg Hx   . Malignant hyperthermia Neg Hx   . Pseudochol deficiency Neg Hx     Social History History  Substance Use Topics  . Smoking status: Never Smoker   . Smokeless tobacco: Never Used  . Alcohol Use: No     Comment: occ    Allergies  Allergen Reactions  . Flagyl [Metronidazole Hcl] Hives and Nausea And Vomiting  . Latex     sensitive    Current Outpatient Prescriptions  Medication Sig Dispense Refill  . cycloSPORINE (RESTASIS) 0.05 %  ophthalmic emulsion Place 1 drop into both eyes 2 (two) times daily as needed (dry eyes.).    Marland Kitchen. famotidine (PEPCID) 20 MG tablet Take 20 mg by mouth 2 (two) times daily as needed for heartburn or indigestion.    . fexofenadine (ALLEGRA) 180 MG tablet Take 180 mg by mouth daily as needed for allergies or rhinitis.    . fluticasone (FLONASE) 50 MCG/ACT nasal spray Place 1 spray into both nostrils 2 (two) times daily as needed for allergies or rhinitis.    Marland Kitchen. ibuprofen (ADVIL,MOTRIN) 200 MG tablet Take 400-600 mg by mouth every 8 (eight) hours as needed for headache or mild pain.    Marland Kitchen. levonorgestrel-ethinyl estradiol (NORDETTE) 0.15-30 MG-MCG tablet Take 1 tablet by mouth daily. 1 Package 11  . levonorgestrel-ethinyl estradiol (NORDETTE) 0.15-30 MG-MCG tablet Take 1 tablet by mouth daily. 3 Package 1  . Olopatadine HCl (PATADAY) 0.2 % SOLN Apply 1 drop to eye 2 (two) times daily as needed.     . Prenatal Vit-Fe Fumarate-FA (PNV PRENATAL PLUS MULTIVITAMIN) 27-1 MG TABS Take 1 tablet by mouth daily before breakfast. 30 tablet 11  . valACYclovir (VALTREX) 1000 MG tablet Take 1,000 mg by mouth every morning.     No current  facility-administered medications for this visit.    Review of Systems Review of Systems Constitutional: negative for fatigue and weight loss Respiratory: negative for cough and wheezing Cardiovascular: negative for chest pain, fatigue and palpitations Gastrointestinal: negative for abdominal pain and change in bowel habits Genitourinary:negative Integument/breast: negative for nipple discharge Musculoskeletal:negative for myalgias Neurological: negative for gait problems and tremors Behavioral/Psych: negative for abusive relationship, depression Endocrine: negative for temperature intolerance     Blood pressure 119/76, pulse 81, weight 131 lb 9.6 oz (59.693 kg), last menstrual period 10/29/2014.  Physical Exam Physical Exam General:   alert  Skin:   no rash or  abnormalities  Lungs:   clear to auscultation bilaterally  Heart:   regular rate and rhythm, S1, S2 normal, no murmur, click, rub or gallop  Breasts:   normal without suspicious masses, skin or nipple changes or axillary nodes  Abdomen:  normal findings: no organomegaly, soft, non-tender and no hernia  Pelvis:  External genitalia: normal general appearance Urinary system: urethral meatus normal and bladder without fullness, nontender Vaginal: normal without tenderness, induration or masses Cervix: normal appearance Adnexa: normal bimanual exam Uterus: anteverted and non-tender, normal size      Data Reviewed Labs  Assessment     Probable candida vaginitis.     Plan    Gynazole  1 Rx. F/U prn.  Orders Placed This Encounter  Procedures  . SureSwab Bacterial Vaginosis/itis   No orders of the defined types were placed in this encounter.

## 2014-11-24 LAB — SURESWAB BACTERIAL VAGINOSIS/ITIS
Atopobium vaginae: NOT DETECTED Log (cells/mL)
C. albicans, DNA: NOT DETECTED
C. glabrata, DNA: NOT DETECTED
C. parapsilosis, DNA: NOT DETECTED
C. tropicalis, DNA: NOT DETECTED
Gardnerella vaginalis: NOT DETECTED Log (cells/mL)
LACTOBACILLUS SPECIES: >8 Log (cells/mL)
MEGASPHAERA SPECIES: NOT DETECTED Log (cells/mL)
T. vaginalis RNA, QL TMA: NOT DETECTED

## 2014-11-29 ENCOUNTER — Telehealth: Payer: Self-pay | Admitting: *Deleted

## 2014-11-29 NOTE — Telephone Encounter (Signed)
Patient reports she was in the office last week and she had a wet prep done- she is still having symptoms and wants the results. 2:41 Patient reports she still has irritation- itching,burning, irritation- patient doesn't have odor. Patient has checked her urine at her work and it is clear. Patient is getting married next month and would like this gone. What should we do? Will check with Dr Clearance CootsHarper and let her know.

## 2014-12-01 ENCOUNTER — Other Ambulatory Visit: Payer: Self-pay | Admitting: Obstetrics

## 2014-12-01 DIAGNOSIS — B3731 Acute candidiasis of vulva and vagina: Secondary | ICD-10-CM

## 2014-12-01 DIAGNOSIS — B373 Candidiasis of vulva and vagina: Secondary | ICD-10-CM

## 2014-12-01 MED ORDER — TERCONAZOLE 0.4 % VA CREA: 1.0000 | TOPICAL_CREAM | Freq: Every day | VAGINAL | Status: DC

## 2014-12-01 NOTE — Telephone Encounter (Signed)
Terazol 7 Rx 

## 2014-12-07 NOTE — Telephone Encounter (Signed)
LM on VM with provider response- Rx sent to pharmacy.

## 2014-12-13 ENCOUNTER — Telehealth: Payer: Self-pay | Admitting: *Deleted

## 2014-12-13 NOTE — Telephone Encounter (Signed)
Patient states she has been on the birth control pills for over 2 months now. Patient states she has had spotting for approximately 3 weeks.  Attempted to contact the patient and left message for patient to call the office.

## 2014-12-21 NOTE — Telephone Encounter (Signed)
Patient states she is taking continuous pills and she is spotting. She is getting married in 9 days and does not want to be bleeding when she is on her honeymoon. Instructed patient on how to double up during the desired time and step back down when she comes back in town. She feels better about things now.

## 2015-01-29 ENCOUNTER — Telehealth: Payer: Self-pay | Admitting: *Deleted

## 2015-01-29 NOTE — Telephone Encounter (Signed)
Patient has questions. 5:05 Patient stopped her birth control and had a cycle 6/20 for 4 days. She has had the mucus discharge and slight cramping for the last week and she did an ovulation kit this week end that was positive. She wants to know when to take a pregnancy test. Told patient she needs to miss a cycle so I would wait until the first of August.

## 2015-02-07 ENCOUNTER — Telehealth: Payer: Self-pay | Admitting: *Deleted

## 2015-02-07 NOTE — Telephone Encounter (Signed)
Patient called the office requesting a call back.  Attempted to contact the patient and left message for patient to call the office.  

## 2015-02-08 NOTE — Telephone Encounter (Signed)
Patient returned call. Patient states she has some concerns. Patient states she she got married on 12-30-14. Patient states she had been spotting before her wedding and had contacted the office and was instructed to double up on her birth control pills until after the wedding. Patient stopped taking the birth control on 01/07/15. Patient states she started her cycle approximately 01-08-15. Patient states she started having a white mucous discharge on 01-24-15 and then ovulation pain that weekend. Patient states she has had unprotected intercourse. Patient states last week she has started having some dizziness, headaches and nausea. 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 Patient states that level was only 3.6. Patient advised if she is pregnant it would have been too early to detect yet. Patient advised to wait until after her missed period which should be around 02-10-15. Patient advised to take a pregnancy test. Patient advised to call with results. Patient verbalized understanding.

## 2015-02-11 ENCOUNTER — Inpatient Hospital Stay (HOSPITAL_COMMUNITY): Payer: BLUE CROSS/BLUE SHIELD

## 2015-02-11 ENCOUNTER — Inpatient Hospital Stay (HOSPITAL_COMMUNITY)
Admission: AD | Admit: 2015-02-11 | Discharge: 2015-02-12 | Disposition: A | Discharge status: Home / Self Care | Payer: BLUE CROSS/BLUE SHIELD | Source: Ambulatory Visit | Attending: Obstetrics | Admitting: Obstetrics

## 2015-02-11 ENCOUNTER — Encounter (HOSPITAL_COMMUNITY): Payer: Self-pay | Admitting: *Deleted

## 2015-02-11 DIAGNOSIS — O209 Hemorrhage in early pregnancy, unspecified: Secondary | ICD-10-CM | POA: Insufficient documentation

## 2015-02-11 DIAGNOSIS — O26851 Spotting complicating pregnancy, first trimester: Secondary | ICD-10-CM | POA: Diagnosis not present

## 2015-02-11 DIAGNOSIS — O3680X0 Pregnancy with inconclusive fetal viability, not applicable or unspecified: Secondary | ICD-10-CM

## 2015-02-11 DIAGNOSIS — Z3A01 Less than 8 weeks gestation of pregnancy: Secondary | ICD-10-CM | POA: Diagnosis not present

## 2015-02-11 LAB — CBC WITH DIFFERENTIAL/PLATELET
Basophils Absolute: 0 10*3/uL (ref 0.0–0.1)
Basophils Relative: 0 % (ref 0–1)
Eosinophils Absolute: 0.2 10*3/uL (ref 0.0–0.7)
Eosinophils Relative: 2 % (ref 0–5)
HCT: 40.1 % (ref 36.0–46.0)
Hemoglobin: 13.7 g/dL (ref 12.0–15.0)
Lymphocytes Relative: 46 % (ref 12–46)
Lymphs Abs: 3.8 10*3/uL (ref 0.7–4.0)
MCH: 32.7 pg (ref 26.0–34.0)
MCHC: 34.2 g/dL (ref 30.0–36.0)
MCV: 95.7 fL (ref 78.0–100.0)
Monocytes Absolute: 0.6 10*3/uL (ref 0.1–1.0)
Monocytes Relative: 7 % (ref 3–12)
Neutro Abs: 3.8 10*3/uL (ref 1.7–7.7)
Neutrophils Relative %: 45 % (ref 43–77)
Platelets: 243 10*3/uL (ref 150–400)
RBC: 4.19 MIL/uL (ref 3.87–5.11)
RDW: 13.4 % (ref 11.5–15.5)
WBC: 8.3 10*3/uL (ref 4.0–10.5)

## 2015-02-11 LAB — URINALYSIS, ROUTINE W REFLEX MICROSCOPIC
Bilirubin Urine: NEGATIVE
Glucose, UA: NEGATIVE mg/dL
Ketones, ur: NEGATIVE mg/dL
Leukocytes, UA: NEGATIVE
Nitrite: NEGATIVE
Protein, ur: NEGATIVE mg/dL
Specific Gravity, Urine: 1.025 (ref 1.005–1.030)
Urobilinogen, UA: 0.2 mg/dL (ref 0.0–1.0)
pH: 6 (ref 5.0–8.0)

## 2015-02-11 LAB — URINE MICROSCOPIC-ADD ON

## 2015-02-11 LAB — HCG, QUANTITATIVE, PREGNANCY: hCG, Beta Chain, Quant, S: 173 m[IU]/mL — ABNORMAL HIGH (ref <5)

## 2015-02-11 LAB — POCT PREGNANCY, URINE: Preg Test, Ur: POSITIVE — AB

## 2015-02-11 IMAGING — US US OB COMP LESS 14 WK
1 series · 13 of 28 positions shown · non-contrast
Comparison: None.

CLINICAL DATA: Pregnant patient with vaginal bleeding. Beta HCG
173. Last menstrual period [DATE].

EXAM:
OBSTETRIC <14 WK US AND TRANSVAGINAL OB US
TECHNIQUE: Both transabdominal and transvaginal ultrasound examinations were
performed for complete evaluation of the gestation as well as the
maternal uterus, adnexal regions, and pelvic cul-de-sac.
Transvaginal technique was performed to assess early pregnancy.

[Series 1: us ob comp less 14 wk · 13 of 42 slices shown]
[im 2/42]
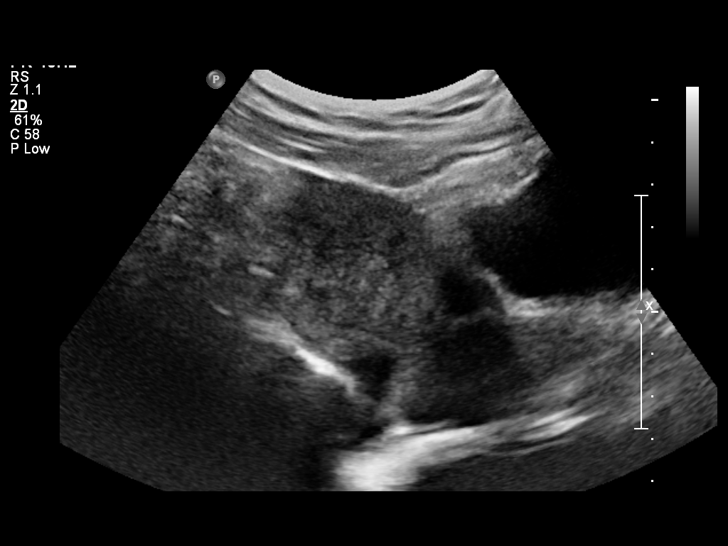
[im 5/42]
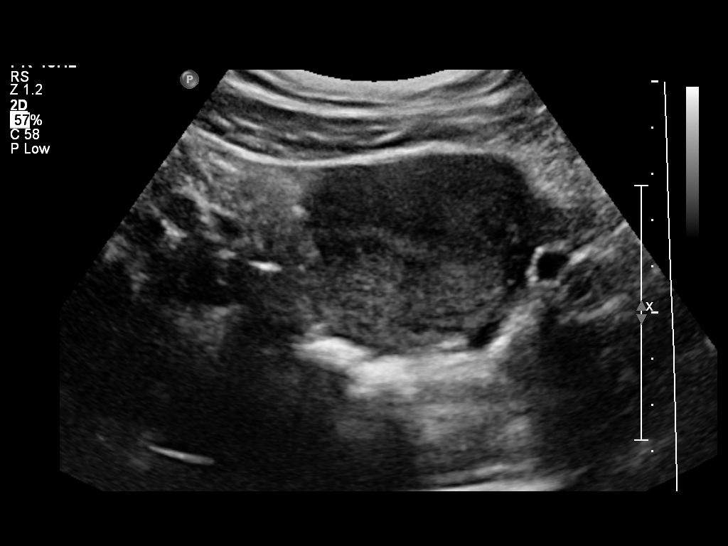
[im 8/42]
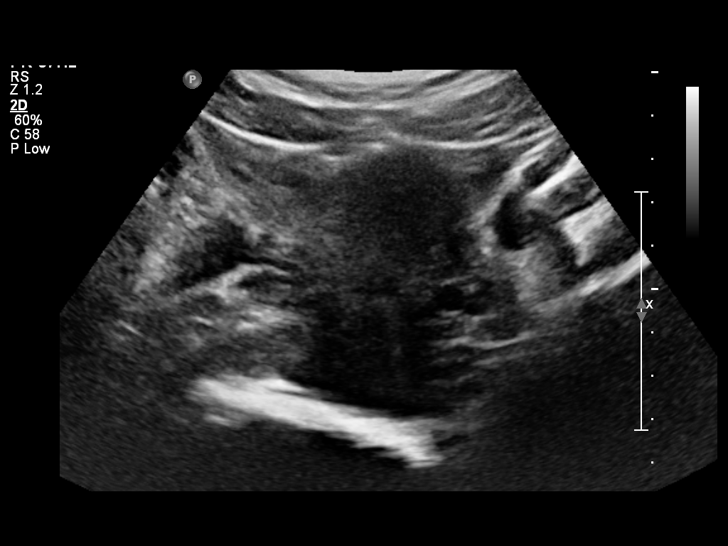
[im 11/42]
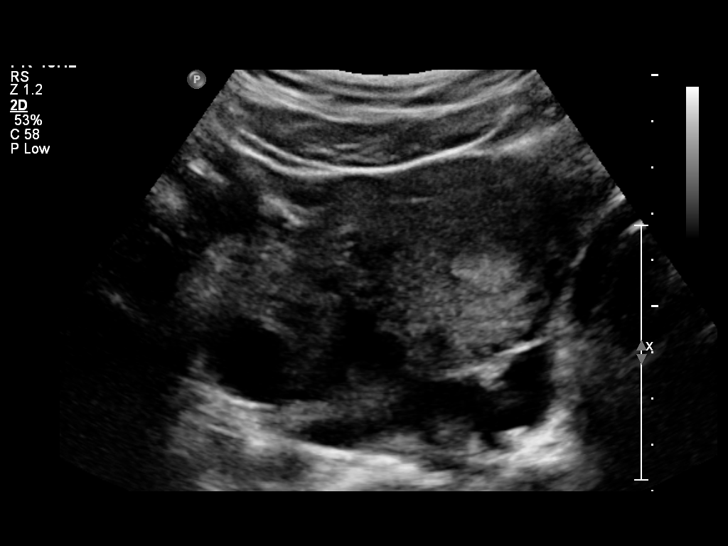
[im 14/42]
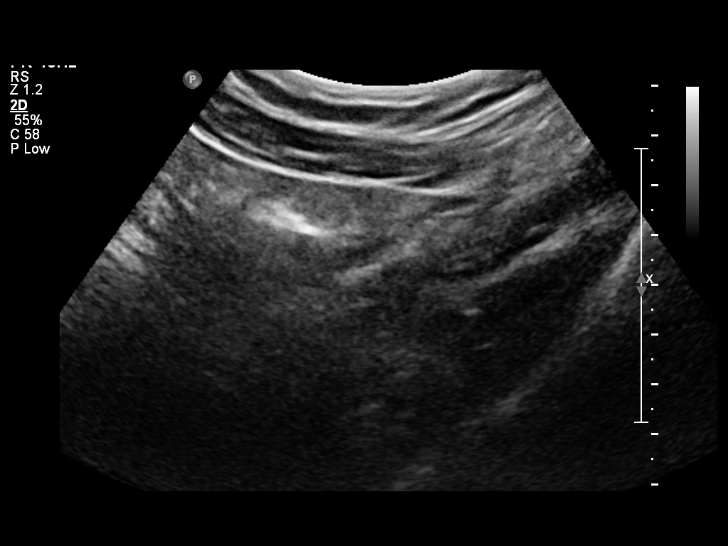
[im 17/42]
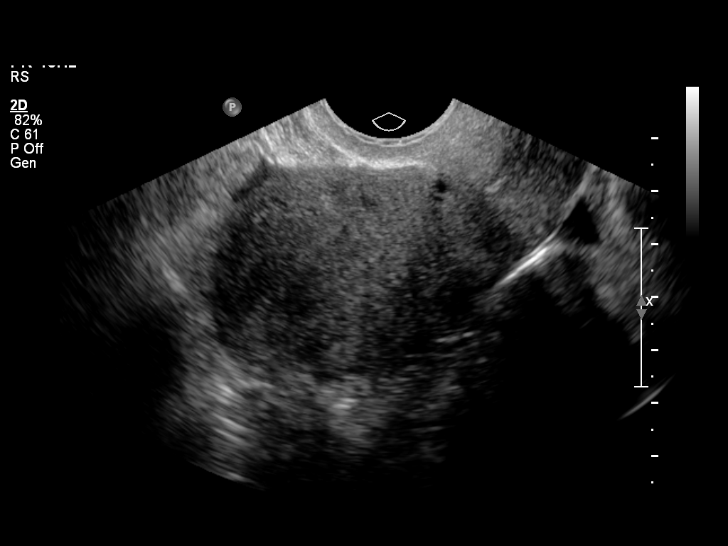
[im 22/42]
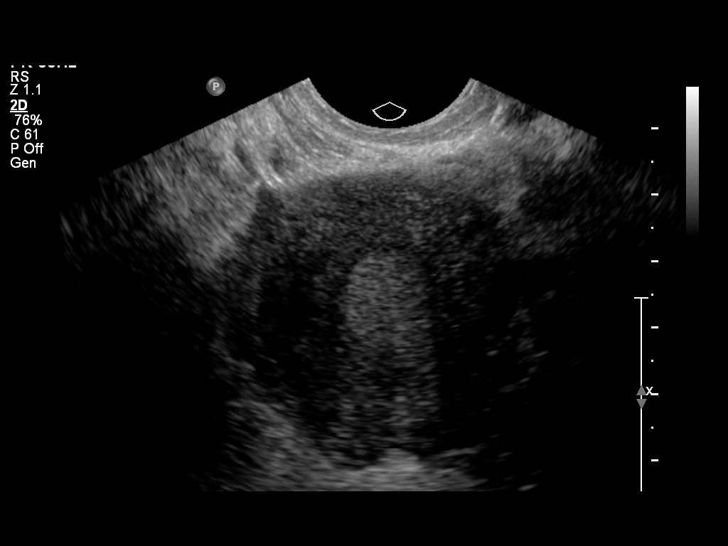
[im 25/42]
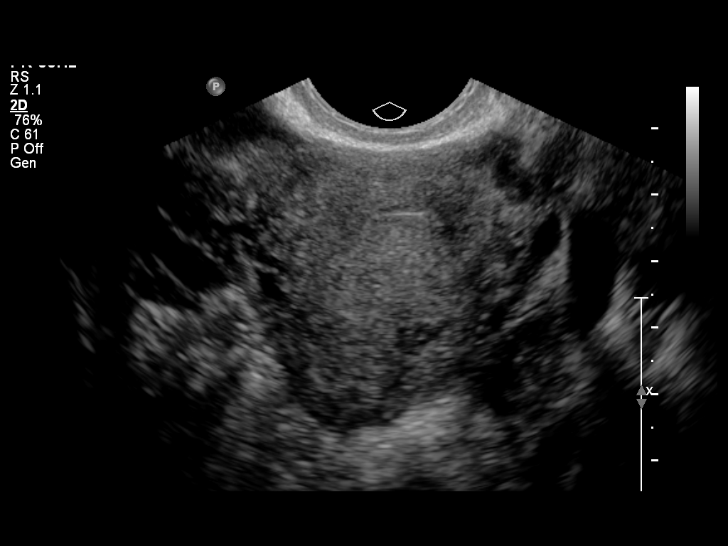
[im 28/42]
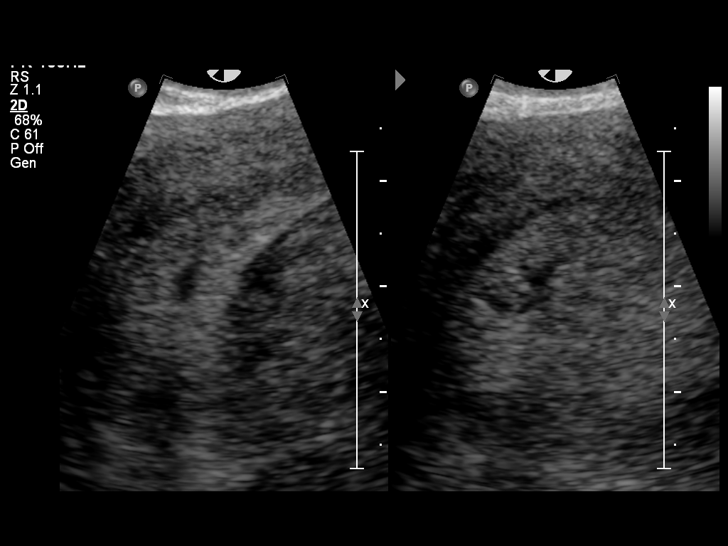
[im 31/42]
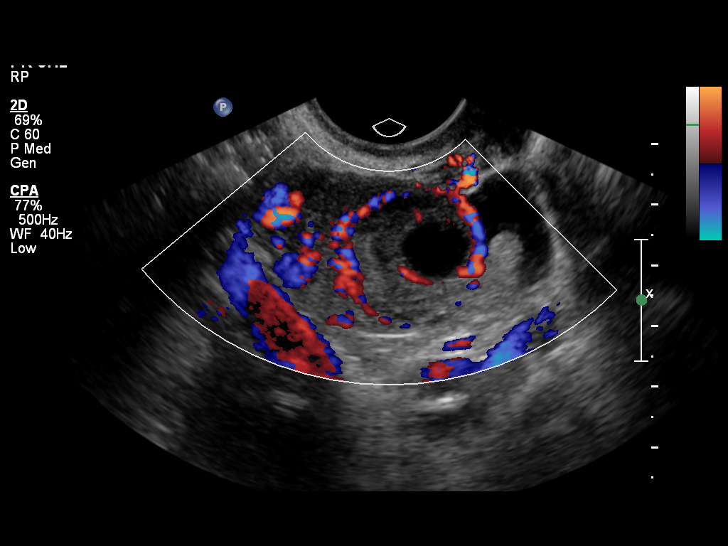
[im 34/42]
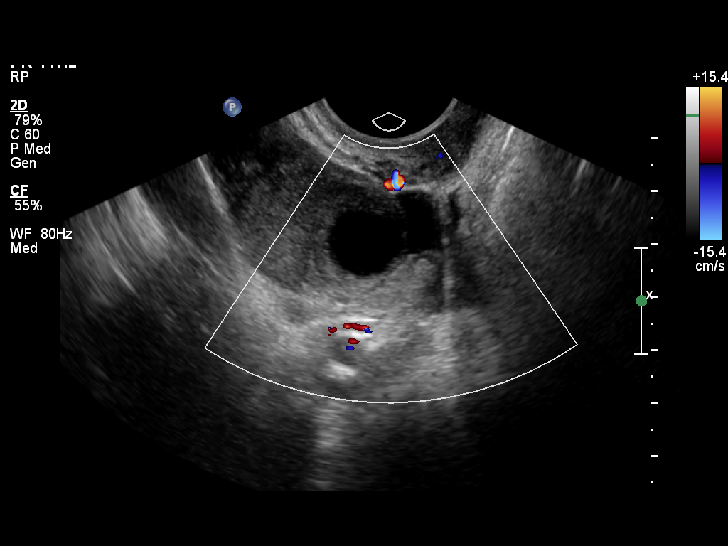
[im 37/42]
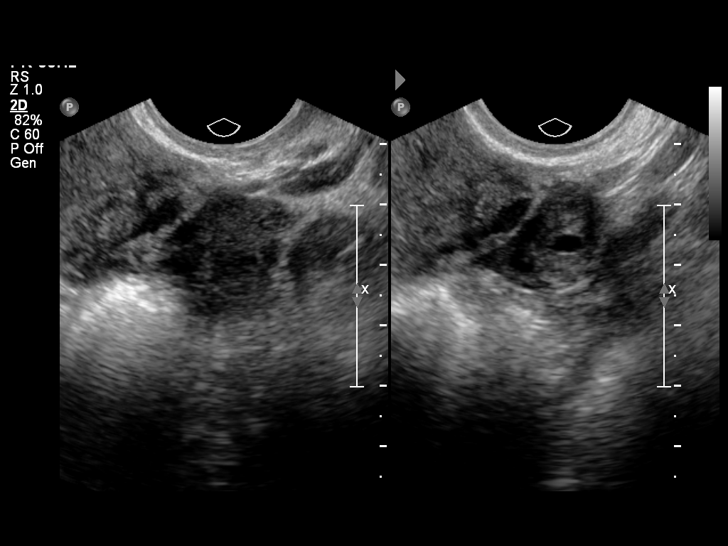
[im 40/42]
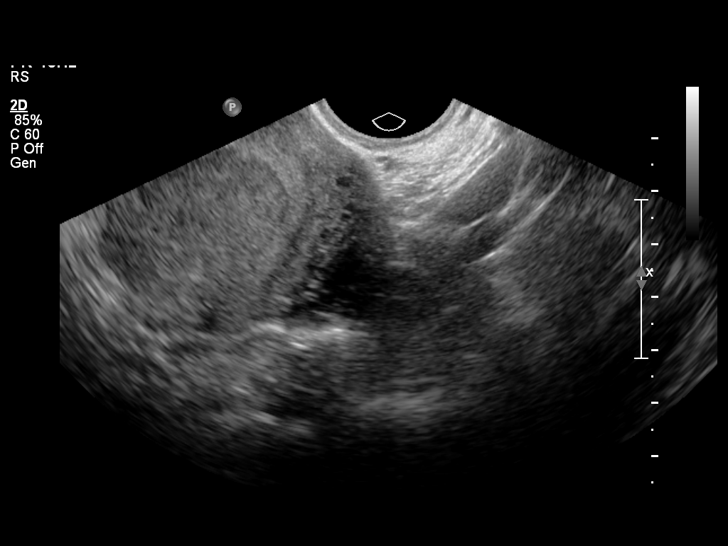

[13 of 28 positions shown; findings below may reference images not displayed]

FINDINGS: Intrauterine gestational sac: Not definitively visualized. There is
a tiny fluid collection in the endometrial canal 4 x 2 x 3 mm.

Yolk sac:  Not present

Embryo:  Not present.

Cardiac Activity: Not present.

Maternal uterus/adnexae: The endometrium measures 10 mm in
thickness. The right ovary measures 4.2 x 2.6 x 3.1 cm with normal
blood flow. A corpus luteal cyst is noted. Left ovary is normal
measuring 2.1 x 2.1 x 1.6 cm. Small amount of fluid in the right
adnexa.
IMPRESSION: Tiny fluid collection in the endometrial canal, may reflect an early
intrauterine gestational sac, but no yolk sac, fetal pole, or
cardiac activity yet visualized. Recommend follow-up quantitative
B-HCG levels and follow-up US in 14 days to confirm and assess
viability. This recommendation follows SRU consensus guidelines:
Diagnostic Criteria for Nonviable Pregnancy Early in the First
Trimester. N Engl J Med [X7]; [DATE].

## 2015-02-11 NOTE — MAU Provider Note (Signed)
History     CSN: [id number]  Arrival date and time: 02/11/15 2034   First Provider Initiated Contact with Patient 02/11/15 2209      No chief complaint on file.  HPI Andrea Dean 29 y.o. [id number] @[redacted]w[redacted]d  presents to MAU and notes that she was due to menses yesterday.  Today, she had 2-3 tiny spots of brown blood in underwear.  She noted pain on left.  Pain is 3/10.   This was her first cycle following discontinuation of OCP with trying to get pregnant.  She has had one ectopic on left in 2013 and she used methotrexate for this.  She had one episode of chlamydia many years ago.  She denies nausea, vomiting, weakness, CP, SOB.   OB History    Gravida Para Term Preterm AB TAB SAB Ectopic Multiple Living   7 2 2  4 2 1 1  2       Past Medical History  Diagnosis Date  . Abnormal Pap smear     repeat WNL  . Chlamydia   . Preterm labor   . Urinary tract infection   . MRSA (methicillin resistant staph aureus) culture positive   . Fracture closed, fibula, shaft 2007    left, no surgical repair  . Ovarian cyst   . Herpes genitalis in women   . H/O seasonal allergies   . GERD (gastroesophageal reflux disease)   . Thrombocytopenia     2006 DURING PREGNANCY - NO PROBLEMS WITH DELIVERY OR ANY KNOWN PROBLEMS SINCE    Past Surgical History  Procedure Laterality Date  . Cesarean section    . Wisdom tooth extraction  2004  . I & d of piloniadal cyst in office - local - felt jittery afterwards    . Pilonidal cyst excision N/A 05/05/2014    Procedure:  EXCISION PILONIDAL CYST;  Surgeon: Romie Levee, MD;  Location: [facility];  Service: General;  Laterality: N/A;    Family History  Problem Relation Age of Onset  . Hypertension Maternal Grandmother   . Stroke Maternal Grandmother   . Cancer Paternal Grandmother     brain, lung  . Diabetes Paternal Grandmother   . Heart disease Paternal Grandmother   . Hypertension Paternal Grandmother   . Anesthesia problems Neg Hx   . Hypotension Neg Hx    . Malignant hyperthermia Neg Hx   . Pseudochol deficiency Neg Hx     History  Substance Use Topics  . Smoking status: Never Smoker   . Smokeless tobacco: Never Used  . Alcohol Use: No     Comment: occ    Allergies:  Allergies  Allergen Reactions  . Flagyl [Metronidazole Hcl] Hives and Nausea And Vomiting  . Latex     sensitive    Prescriptions prior to admission  Medication Sig Dispense Refill Last Dose  . Prenatal Vit-Fe Fumarate-FA (PNV PRENATAL PLUS MULTIVITAMIN) 27-1 MG TABS Take 1 tablet by mouth daily before breakfast. 30 tablet 11 02/11/2015 at Unknown time  . valACYclovir (VALTREX) 1000 MG tablet Take 1,000 mg by mouth every morning.   02/11/2015 at Unknown time  . Butoconazole Nitrate, 1 Dose, (GYNAZOLE-1) 2 % CREA Place 1 Applicatorful vaginally at bedtime as needed. 5.8 g 0 More than a month at Unknown time  . cycloSPORINE (RESTASIS) 0.05 % ophthalmic emulsion Place 1 drop into both eyes 2 (two) times daily as needed (dry eyes.).   More than a month at Unknown time  . famotidine (PEPCID) 20 MG tablet Take  20 mg by mouth 2 (two) times daily as needed for heartburn or indigestion.   More than a month at Unknown time  . fexofenadine (ALLEGRA) 180 MG tablet Take 180 mg by mouth daily as needed for allergies or rhinitis.   More than a month at Unknown time  . fluticasone (FLONASE) 50 MCG/ACT nasal spray Place 1 spray into both nostrils 2 (two) times daily as needed for allergies or rhinitis.   More than a month at Unknown time  . ibuprofen (ADVIL,MOTRIN) 200 MG tablet Take 400-600 mg by mouth every 8 (eight) hours as needed for headache or mild pain.   More than a month at Unknown time  . levonorgestrel-ethinyl estradiol (NORDETTE) 0.15-30 MG-MCG tablet Take 1 tablet by mouth daily. 1 Package 11   . levonorgestrel-ethinyl estradiol (NORDETTE) 0.15-30 MG-MCG tablet Take 1 tablet by mouth daily. 3 Package 1   . Olopatadine HCl (PATADAY) 0.2 % SOLN Apply 1 drop to eye 2 (two) times  daily as needed.    More than a month at Unknown time  . terconazole (TERAZOL 7) 0.4 % vaginal cream Place 1 applicator vaginally at bedtime. 45 g 0 More than a month at Unknown time    ROS Pertinent ROS in HPI.  All other systems are negative.   Physical Exam   Blood pressure 121/72, pulse 84, temperature 98.4 F (36.9 C), temperature source Oral, resp. rate 16, height 5' 2.5" (1.588 m), weight 134 lb (60.782 kg), last menstrual period 01/11/2015, SpO2 100 %.  Physical Exam  Constitutional: She is oriented to person, place, and time. She appears well-developed and well-nourished. No distress.  HENT:  Head: Normocephalic and atraumatic.  Eyes: EOM are normal.  Neck: Normal range of motion.  Cardiovascular: Normal rate.   Respiratory: Effort normal. No respiratory distress.  GI: Soft. She exhibits no distension. There is no tenderness.  Genitourinary:  mod amt of thick clumpy brown adherent discharge No CMT.  No adnexal mass or tenderness  Musculoskeletal: Normal range of motion.  Neurological: She is alert and oriented to person, place, and time.  Skin: Skin is warm and dry.  Psychiatric: She has a normal mood and affect.   Results for orders placed or performed during the hospital encounter of 02/11/15 (from the past 24 hour(s))  Urinalysis, Routine w reflex microscopic (not at [facility])     Status: Abnormal   Collection Time: 02/11/15  8:58 PM  Result Value Ref Range   Color, Urine YELLOW YELLOW   APPearance CLEAR CLEAR   Specific Gravity, Urine 1.025 1.005 - 1.030   pH 6.0 5.0 - 8.0   Glucose, UA NEGATIVE NEGATIVE mg/dL   Hgb urine dipstick SMALL (A) NEGATIVE   Bilirubin Urine NEGATIVE NEGATIVE   Ketones, ur NEGATIVE NEGATIVE mg/dL   Protein, ur NEGATIVE NEGATIVE mg/dL   Urobilinogen, UA 0.2 0.0 - 1.0 mg/dL   Nitrite NEGATIVE NEGATIVE   Leukocytes, UA NEGATIVE NEGATIVE  Urine microscopic-add on     Status: None   Collection Time: 02/11/15  8:58 PM  Result Value Ref Range    Squamous Epithelial / LPF RARE RARE   WBC, UA 0-2 <3 WBC/hpf   RBC / HPF 0-2 <3 RBC/hpf   Bacteria, UA RARE RARE  Pregnancy, urine POC     Status: Abnormal   Collection Time: 02/11/15  9:20 PM  Result Value Ref Range   Preg Test, Ur POSITIVE (A) NEGATIVE  CBC with Differential/Platelet     Status: None   Collection  Time: 02/11/15 10:25 PM  Result Value Ref Range   WBC 8.3 4.0 - 10.5 K/uL   RBC 4.19 3.87 - 5.11 MIL/uL   Hemoglobin 13.7 12.0 - 15.0 g/dL   HCT 57.8 46.9 - 62.9 %   MCV 95.7 78.0 - 100.0 fL   MCH 32.7 26.0 - 34.0 pg   MCHC 34.2 30.0 - 36.0 g/dL   RDW 52.8 41.3 - 24.4 %   Platelets 243 150 - 400 K/uL   Neutrophils Relative % 45 43 - 77 %   Neutro Abs 3.8 1.7 - 7.7 K/uL   Lymphocytes Relative 46 12 - 46 %   Lymphs Abs 3.8 0.7 - 4.0 K/uL   Monocytes Relative 7 3 - 12 %   Monocytes Absolute 0.6 0.1 - 1.0 K/uL   Eosinophils Relative 2 0 - 5 %   Eosinophils Absolute 0.2 0.0 - 0.7 K/uL   Basophils Relative 0 0 - 1 %   Basophils Absolute 0.0 0.0 - 0.1 K/uL  hCG, quantitative, pregnancy     Status: Abnormal   Collection Time: 02/11/15 10:25 PM  Result Value Ref Range   hCG, Beta Chain, Quant, S 173 (H) <5 mIU/mL  Wet prep, genital     Status: Abnormal   Collection Time: 02/11/15 10:34 PM  Result Value Ref Range   Yeast Wet Prep HPF POC NONE SEEN NONE SEEN   Trich, Wet Prep NONE SEEN NONE SEEN   Clue Cells Wet Prep HPF POC NONE SEEN NONE SEEN   WBC, Wet Prep HPF POC RARE (A) NONE SEEN   US Ob Comp Less 14 Wks  02/11/2015   CLINICAL DATA:  Pregnant patient with vaginal bleeding. Beta HCG 173. Last menstrual period 01/11/2015.  EXAM: OBSTETRIC <14 WK [place] AND TRANSVAGINAL OB US  TECHNIQUE: Both transabdominal and transvaginal ultrasound examinations were performed for complete evaluation of the gestation as well as the maternal uterus, adnexal regions, and pelvic cul-de-sac. Transvaginal technique was performed to assess early pregnancy.  COMPARISON:  None.  FINDINGS:  Intrauterine gestational sac: Not definitively visualized. There is a tiny fluid collection in the endometrial canal 4 x 2 x 3 mm.  Yolk sac:  Not present  Embryo:  Not present.  Cardiac Activity: Not present.  Maternal uterus/adnexae: The endometrium measures 10 mm in thickness. The right ovary measures 4.2 x 2.6 x 3.1 cm with normal blood flow. A corpus luteal cyst is noted. Left ovary is normal measuring 2.1 x 2.1 x 1.6 cm. Small amount of fluid in the right adnexa.  IMPRESSION: Tiny fluid collection in the endometrial canal, may reflect an early intrauterine gestational sac, but no yolk sac, fetal pole, or cardiac activity yet visualized. Recommend follow-up quantitative B-HCG levels and follow-up US in 14 days to confirm and assess viability. This recommendation follows SRU consensus guidelines: Diagnostic Criteria for Nonviable Pregnancy Early in the First Trimester. Malva Limes Med 2013; 010:2725-36.   Electronically Signed   By: Rubye Oaks M.D.   On: 02/11/2015 23:17   US Ob Transvaginal  02/11/2015   CLINICAL DATA:  Pregnant patient with vaginal bleeding. Beta HCG 173. Last menstrual period 01/11/2015.  EXAM: OBSTETRIC <14 WK [place] AND TRANSVAGINAL OB US  TECHNIQUE: Both transabdominal and transvaginal ultrasound examinations were performed for complete evaluation of the gestation as well as the maternal uterus, adnexal regions, and pelvic cul-de-sac. Transvaginal technique was performed to assess early pregnancy.  COMPARISON:  None.  FINDINGS: Intrauterine gestational sac: Not definitively visualized. There is  a tiny fluid collection in the endometrial canal 4 x 2 x 3 mm.  Yolk sac:  Not present  Embryo:  Not present.  Cardiac Activity: Not present.  Maternal uterus/adnexae: The endometrium measures 10 mm in thickness. The right ovary measures 4.2 x 2.6 x 3.1 cm with normal blood flow. A corpus luteal cyst is noted. Left ovary is normal measuring 2.1 x 2.1 x 1.6 cm. Small amount of fluid in the right  adnexa.  IMPRESSION: Tiny fluid collection in the endometrial canal, may reflect an early intrauterine gestational sac, but no yolk sac, fetal pole, or cardiac activity yet visualized. Recommend follow-up quantitative B-HCG levels and follow-up US in 14 days to confirm and assess viability. This recommendation follows SRU consensus guidelines: Diagnostic Criteria for Nonviable Pregnancy Early in the First Trimester. Malva Limes Med 2013; 409:8119-14.   Electronically Signed   By: Rubye Oaks M.D.   On: 02/11/2015 23:17   MAU Course  Procedures  MDM Unable to see location of pregnancy = could be due to early gestation but unable to rule out ectopic No evidence for infection or emergency at this time  Assessment and Plan  A:  1. Pregnancy of unknown anatomic location   2. Vaginal bleeding in pregnancy, first trimester     P: Discharge to home Ectopic precautions stressed Return to MAU in 48 hours for repeat quant PNV qd Patient may return to MAU as needed or if her condition were to change or worsen   Bertram Denver 02/11/2015, 10:10 PM

## 2015-02-11 NOTE — Discharge Instructions (Signed)
°Ectopic Pregnancy °An ectopic pregnancy is when the fertilized egg attaches (implants) outside the uterus. Most ectopic pregnancies occur in the fallopian tube. Rarely do ectopic pregnancies occur on the ovary, intestine, pelvis, or cervix. In an ectopic pregnancy, the fertilized egg does not have the ability to develop into a normal, healthy baby.  °A ruptured ectopic pregnancy is one in which the fallopian tube gets torn or bursts and results in internal bleeding. Often there is intense abdominal pain, and sometimes, vaginal bleeding. Having an ectopic pregnancy can be life threatening. If left untreated, this dangerous condition can lead to a blood transfusion, abdominal surgery, or even death. °CAUSES  °Damage to the fallopian tubes is the suspected cause in most ectopic pregnancies.  °RISK FACTORS °Depending on your circumstances, the risk of having an ectopic pregnancy will vary. The level of risk can be divided into three categories. °High Risk °· You have gone through infertility treatment. °· You have had a previous ectopic pregnancy. °· You have had previous tubal surgery. °· You have had previous surgery to have the fallopian tubes tied (tubal ligation). °· You have tubal problems or diseases. °· You have been exposed to DES. DES is a medicine that was used until 1971 and had effects on babies whose mothers took the medicine. °· You become pregnant while using an intrauterine device (IUD) for birth control.  °Moderate Risk °· You have a history of infertility. °· You have a history of a sexually transmitted infection (STI). °· You have a history of pelvic inflammatory disease (PID). °· You have scarring from endometriosis. °· You have multiple sexual partners. °· You smoke.  °Low Risk °· You have had previous pelvic surgery. °· You use vaginal douching. °· You became sexually active before 29 years of age. °SIGNS AND SYMPTOMS  °An ectopic pregnancy should be suspected in anyone who has missed a period  and has abdominal pain or bleeding. °· You may experience normal pregnancy symptoms, such as: °¨ Nausea. °¨ Tiredness. °¨ Breast tenderness. °· Other symptoms may include: °¨ Pain with intercourse. °¨ Irregular vaginal bleeding or spotting. °¨ Cramping or pain on one side or in the lower abdomen. °¨ Fast heartbeat. °¨ Passing out while having a bowel movement. °· Symptoms of a ruptured ectopic pregnancy and internal bleeding may include: °¨ Sudden, severe pain in the abdomen and pelvis. °¨ Dizziness or fainting. °¨ Pain in the shoulder area. °DIAGNOSIS  °Tests that may be performed include: °· A pregnancy test. °· An ultrasound test. °· Testing the specific level of pregnancy hormone in the bloodstream. °· Taking a sample of uterus tissue (dilation and curettage, D&C). °· Surgery to perform a visual exam of the inside of the abdomen using a thin, lighted tube with a tiny camera on the end (laparoscope). °TREATMENT  °An injection of a medicine called methotrexate may be given. This medicine causes the pregnancy tissue to be absorbed. It is given if: °· The diagnosis is made early. °· The fallopian tube has not ruptured. °· You are considered to be a good candidate for the medicine. °Usually, pregnancy hormone blood levels are checked after methotrexate treatment. This is to be sure the medicine is effective. It may take 4-6 weeks for the pregnancy to be absorbed (though most pregnancies will be absorbed by 3 weeks). °Surgical treatment may be needed. A laparoscope may be used to remove the pregnancy tissue. If severe internal bleeding occurs, a cut (incision) may be made in the lower abdomen (laparotomy), and the ectopic   pregnancy is removed. This stops the bleeding. Part of the fallopian tube, or the whole tube, may be removed as well (salpingectomy). After surgery, pregnancy hormone tests may be done to be sure there is no pregnancy tissue left. You may receive a Rho (D) immune globulin shot if you are Rh negative  and the father is Rh positive, or if you do not know the Rh type of the father. This is to prevent problems with any future pregnancy. °SEEK IMMEDIATE MEDICAL CARE IF:  °You have any symptoms of an ectopic pregnancy. This is a medical emergency. °MAKE SURE YOU: °· Understand these instructions. °· Will watch your condition. °· Will get help right away if you are not doing well or get worse. °Document Released: 08/14/2004 Document Revised: 11/21/2013 Document Reviewed: 02/03/2013 °ExitCare® Patient Information ©2015 [employer]. This information is not intended to replace advice given to you by your health care provider. Make sure you discuss any questions you have with your health care provider. ° ° °

## 2015-02-11 NOTE — MAU Note (Signed)
Pt reports she stopped BCP's on 06/19, had some bleeding right after. 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

## 2015-02-11 NOTE — MAU Note (Signed)
Pt was on BCP just to prevent menstruation on honeymoon. Pt was trying to get pregnant this month. Pt had withdrawl bleeding from BCP on June 23.

## 2015-02-12 DIAGNOSIS — O209 Hemorrhage in early pregnancy, unspecified: Secondary | ICD-10-CM | POA: Diagnosis not present

## 2015-02-12 LAB — WET PREP, GENITAL
Clue Cells Wet Prep HPF POC: NONE SEEN
Trich, Wet Prep: NONE SEEN
Yeast Wet Prep HPF POC: NONE SEEN

## 2015-02-13 ENCOUNTER — Inpatient Hospital Stay (HOSPITAL_COMMUNITY)
Admission: AD | Admit: 2015-02-13 | Discharge: 2015-02-13 | Disposition: A | Discharge status: Home / Self Care | Payer: BLUE CROSS/BLUE SHIELD | Source: Ambulatory Visit | Attending: Obstetrics | Admitting: Obstetrics

## 2015-02-13 ENCOUNTER — Encounter (HOSPITAL_COMMUNITY): Payer: Self-pay | Admitting: *Deleted

## 2015-02-13 DIAGNOSIS — O26851 Spotting complicating pregnancy, first trimester: Secondary | ICD-10-CM

## 2015-02-13 DIAGNOSIS — Z3493 Encounter for supervision of normal pregnancy, unspecified, third trimester: Secondary | ICD-10-CM | POA: Insufficient documentation

## 2015-02-13 LAB — HCG, QUANTITATIVE, PREGNANCY: hCG, Beta Chain, Quant, S: 721 m[IU]/mL — ABNORMAL HIGH (ref <5)

## 2015-02-13 NOTE — Discharge Instructions (Signed)
Vaginal Bleeding During Pregnancy, First Trimester °A small amount of bleeding (spotting) from the vagina is relatively common in early pregnancy. It usually stops on its own. Various things may cause bleeding or spotting in early pregnancy. Some bleeding may be related to the pregnancy, and some may not. In most cases, the bleeding is normal and is not a problem. However, bleeding can also be a sign of something serious. Be sure to tell your health care provider about any vaginal bleeding right away. °Some possible causes of vaginal bleeding during the first trimester include: °· Infection or inflammation of the cervix. °· Growths (polyps) on the cervix. °· Miscarriage or threatened miscarriage. °· Pregnancy tissue has developed outside of the uterus and in a fallopian tube (tubal pregnancy). °· Tiny cysts have developed in the uterus instead of pregnancy tissue (molar pregnancy). °HOME CARE INSTRUCTIONS  °Watch your condition for any changes. The following actions may help to lessen any discomfort you are feeling: °· Follow your health care provider's instructions for limiting your activity. If your health care provider orders bed rest, you may need to stay in bed and only get up to use the bathroom. However, your health care provider may allow you to continue light activity. °· If needed, make plans for someone to help with your regular activities and responsibilities while you are on bed rest. °· Keep track of the number of pads you use each day, how often you change pads, and how soaked (saturated) they are. Write this down. °· Do not use tampons. Do not douche. °· Do not have sexual intercourse or orgasms until approved by your health care provider. °· If you pass any tissue from your vagina, save the tissue so you can show it to your health care provider. °· Only take over-the-counter or prescription medicines as directed by your health care provider. °· Do not take aspirin because it can make you  bleed. °· Keep all follow-up appointments as directed by your health care provider. °SEEK MEDICAL CARE IF: °· You have any vaginal bleeding during any part of your pregnancy. °· You have cramps or labor pains. °· You have a fever, not controlled by medicine. °SEEK IMMEDIATE MEDICAL CARE IF:  °· You have severe cramps in your back or belly (abdomen). °· You pass large clots or tissue from your vagina. °· Your bleeding increases. °· You feel light-headed or weak, or you have fainting episodes. °· You have chills. °· You are leaking fluid or have a gush of fluid from your vagina. °· You pass out while having a bowel movement. °MAKE SURE YOU: °· Understand these instructions. °· Will watch your condition. °· Will get help right away if you are not doing well or get worse. °Document Released: 04/16/2005 Document Revised: 07/12/2013 Document Reviewed: 03/14/2013 °ExitCare® Patient Information ©2015 [employer]. This information is not intended to replace advice given to you by your health care provider. Make sure you discuss any questions you have with your health care provider. °Ectopic Pregnancy °An ectopic pregnancy is when the fertilized egg attaches (implants) outside the uterus. Most ectopic pregnancies occur in the fallopian tube. Rarely do ectopic pregnancies occur on the ovary, intestine, pelvis, or cervix. In an ectopic pregnancy, the fertilized egg does not have the ability to develop into a normal, healthy baby.  °A ruptured ectopic pregnancy is one in which the fallopian tube gets torn or bursts and results in internal bleeding. Often there is intense abdominal pain, and sometimes, vaginal bleeding. Having   an ectopic pregnancy can be life threatening. If left untreated, this dangerous condition can lead to a blood transfusion, abdominal surgery, or even death. °CAUSES  °Damage to the fallopian tubes is the suspected cause in most ectopic pregnancies.  °RISK FACTORS °Depending on your circumstances, the risk  of having an ectopic pregnancy will vary. The level of risk can be divided into three categories. °High Risk °· You have gone through infertility treatment. °· You have had a previous ectopic pregnancy. °· You have had previous tubal surgery. °· You have had previous surgery to have the fallopian tubes tied (tubal ligation). °· You have tubal problems or diseases. °· You have been exposed to DES. DES is a medicine that was used until 1971 and had effects on babies whose mothers took the medicine. °· You become pregnant while using an intrauterine device (IUD) for birth control.  °Moderate Risk °· You have a history of infertility. °· You have a history of a sexually transmitted infection (STI). °· You have a history of pelvic inflammatory disease (PID). °· You have scarring from endometriosis. °· You have multiple sexual partners. °· You smoke.  °Low Risk °· You have had previous pelvic surgery. °· You use vaginal douching. °· You became sexually active before 29 years of age. °SIGNS AND SYMPTOMS  °An ectopic pregnancy should be suspected in anyone who has missed a period and has abdominal pain or bleeding. °· You may experience normal pregnancy symptoms, such as: °¨ Nausea. °¨ Tiredness. °¨ Breast tenderness. °· Other symptoms may include: °¨ Pain with intercourse. °¨ Irregular vaginal bleeding or spotting. °¨ Cramping or pain on one side or in the lower abdomen. °¨ Fast heartbeat. °¨ Passing out while having a bowel movement. °· Symptoms of a ruptured ectopic pregnancy and internal bleeding may include: °¨ Sudden, severe pain in the abdomen and pelvis. °¨ Dizziness or fainting. °¨ Pain in the shoulder area. °DIAGNOSIS  °Tests that may be performed include: °· A pregnancy test. °· An ultrasound test. °· Testing the specific level of pregnancy hormone in the bloodstream. °· Taking a sample of uterus tissue (dilation and curettage, D&C). °· Surgery to perform a visual exam of the inside of the abdomen using a thin,  lighted tube with a tiny camera on the end (laparoscope). °TREATMENT  °An injection of a medicine called methotrexate may be given. This medicine causes the pregnancy tissue to be absorbed. It is given if: °· The diagnosis is made early. °· The fallopian tube has not ruptured. °· You are considered to be a good candidate for the medicine. °Usually, pregnancy hormone blood levels are checked after methotrexate treatment. This is to be sure the medicine is effective. It may take 4-6 weeks for the pregnancy to be absorbed (though most pregnancies will be absorbed by 3 weeks). °Surgical treatment may be needed. A laparoscope may be used to remove the pregnancy tissue. If severe internal bleeding occurs, a cut (incision) may be made in the lower abdomen (laparotomy), and the ectopic pregnancy is removed. This stops the bleeding. Part of the fallopian tube, or the whole tube, may be removed as well (salpingectomy). After surgery, pregnancy hormone tests may be done to be sure there is no pregnancy tissue left. You may receive a Rho (D) immune globulin shot if you are Rh negative and the father is Rh positive, or if you do not know the Rh type of the father. This is to prevent problems with any future pregnancy. °SEEK IMMEDIATE MEDICAL CARE IF:  °  You have any symptoms of an ectopic pregnancy. This is a medical emergency. °MAKE SURE YOU: °· Understand these instructions. °· Will watch your condition. °· Will get help right away if you are not doing well or get worse. °Document Released: 08/14/2004 Document Revised: 11/21/2013 Document Reviewed: 02/03/2013 °ExitCare® Patient Information ©2015 [employer]. This information is not intended to replace advice given to you by your health care provider. Make sure you discuss any questions you have with your health care provider. ° °

## 2015-02-13 NOTE — MAU Note (Signed)
PT  WAS HERE   Sunday  NIGHT - PT  SAYS SHE HAS VAG BLEEDING  WHEN  SHE  WIPES -  LESS  THAN Sunday NIGHT.       TAN D/C  IN COLOR.       Sunday NIGHT  PAIN - 3-4.   PAIN  NOW -   0   PLAN-  TO HAVE  LABS  REDRAWN

## 2015-02-19 ENCOUNTER — Inpatient Hospital Stay (HOSPITAL_COMMUNITY)
Admission: AD | Admit: 2015-02-19 | Discharge: 2015-02-19 | Disposition: A | Discharge status: Home / Self Care | Payer: BLUE CROSS/BLUE SHIELD | Source: Ambulatory Visit | Attending: Family Medicine | Admitting: Family Medicine

## 2015-02-19 ENCOUNTER — Inpatient Hospital Stay (HOSPITAL_COMMUNITY): Payer: BLUE CROSS/BLUE SHIELD

## 2015-02-19 DIAGNOSIS — O26899 Other specified pregnancy related conditions, unspecified trimester: Secondary | ICD-10-CM

## 2015-02-19 DIAGNOSIS — Z3A01 Less than 8 weeks gestation of pregnancy: Secondary | ICD-10-CM | POA: Insufficient documentation

## 2015-02-19 DIAGNOSIS — R109 Unspecified abdominal pain: Secondary | ICD-10-CM

## 2015-02-19 DIAGNOSIS — O3680X0 Pregnancy with inconclusive fetal viability, not applicable or unspecified: Secondary | ICD-10-CM

## 2015-02-19 DIAGNOSIS — O9989 Other specified diseases and conditions complicating pregnancy, childbirth and the puerperium: Secondary | ICD-10-CM | POA: Diagnosis not present

## 2015-02-19 LAB — HCG, QUANTITATIVE, PREGNANCY: hCG, Beta Chain, Quant, S: 13054 m[IU]/mL — ABNORMAL HIGH (ref <5)

## 2015-02-19 IMAGING — US US OB TRANSVAGINAL
1 series · 13 of 28 positions shown · non-contrast
Comparison: [DATE]

CLINICAL DATA: Early pregnancy, pain, followup to [DATE],
evaluate pregnancy location, first trimester pregnancy

EXAM:
TRANSVAGINAL OB ULTRASOUND
TECHNIQUE: Transvaginal ultrasound was performed for complete evaluation of the
gestation as well as the maternal uterus, adnexal regions, and
pelvic cul-de-sac.

[Series 1: us ob transvaginal · 13 of 45 slices shown]
[im 2/45]
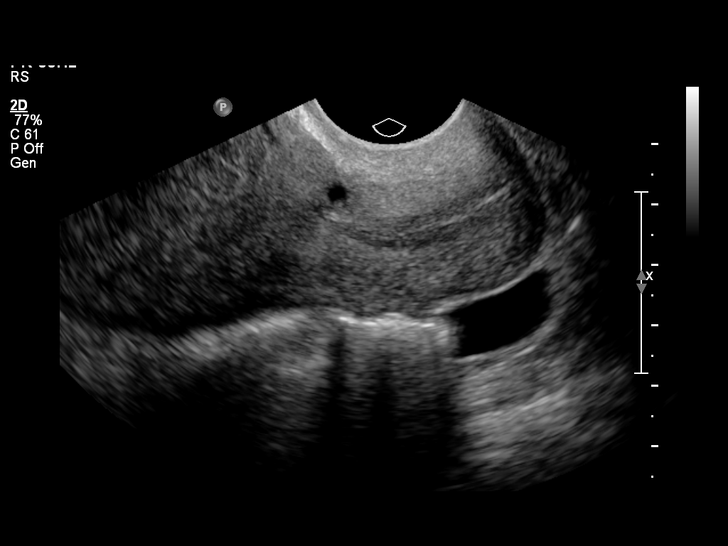
[im 5/45]
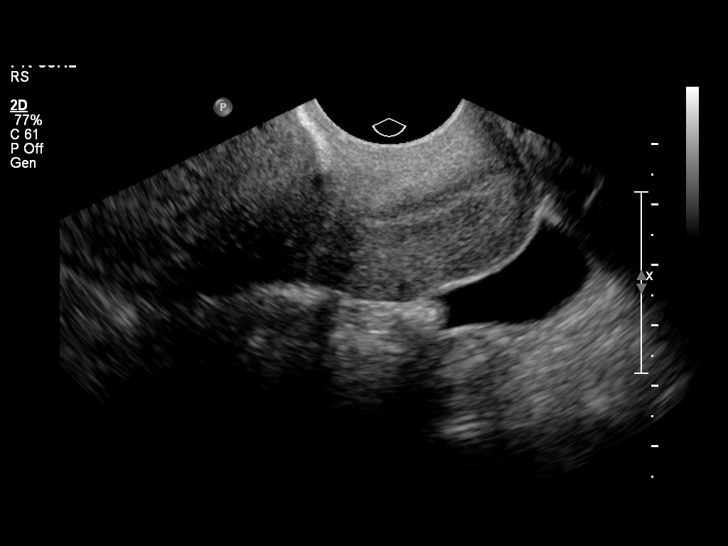
[im 9/45]
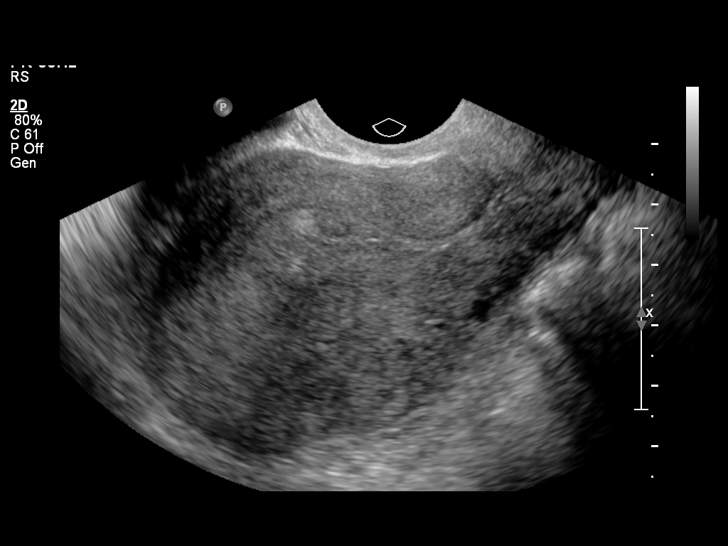
[im 12/45]
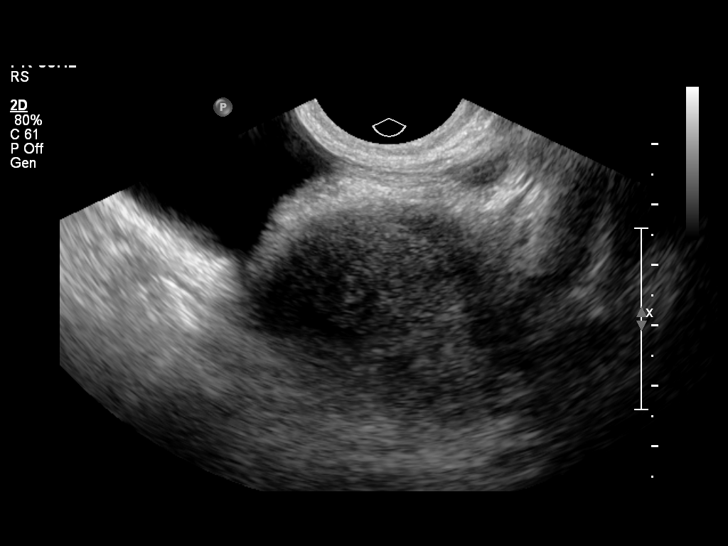
[im 15/45]
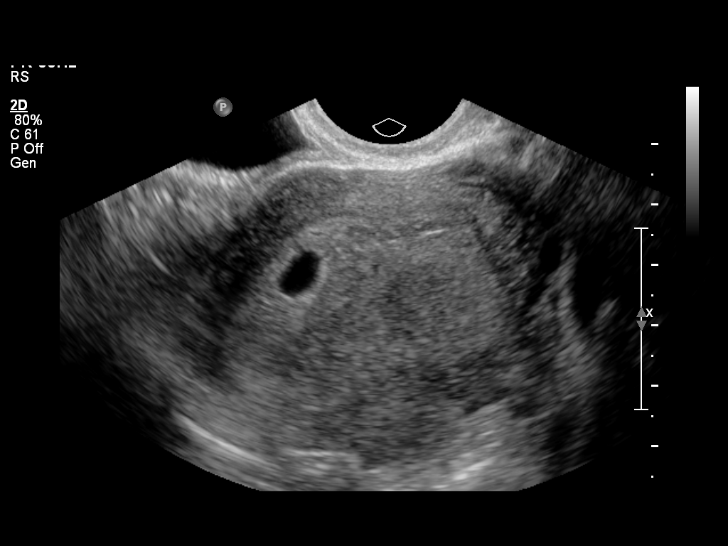
[im 18/45]
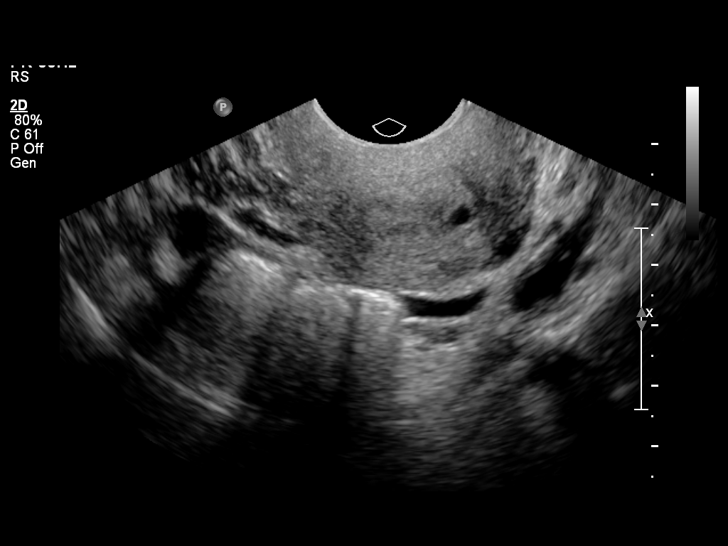
[im 23/45]
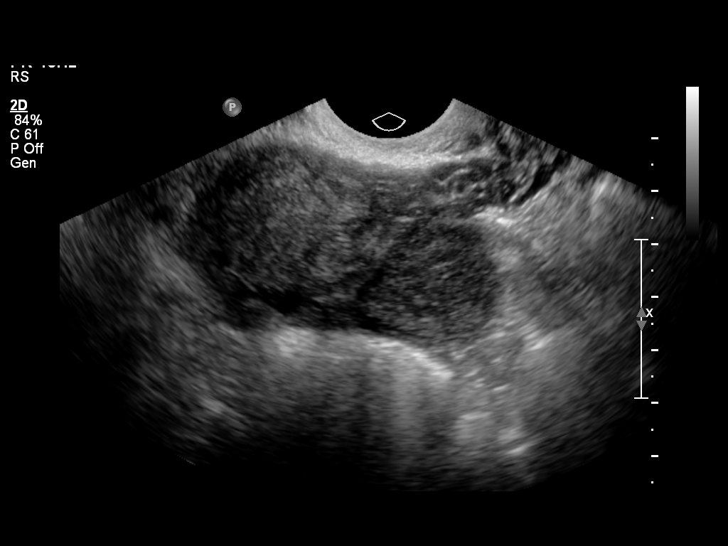
[im 27/45]
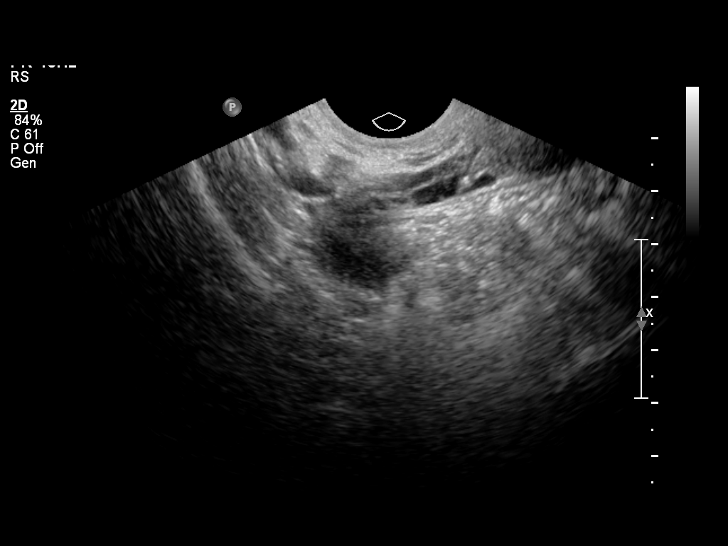
[im 30/45]
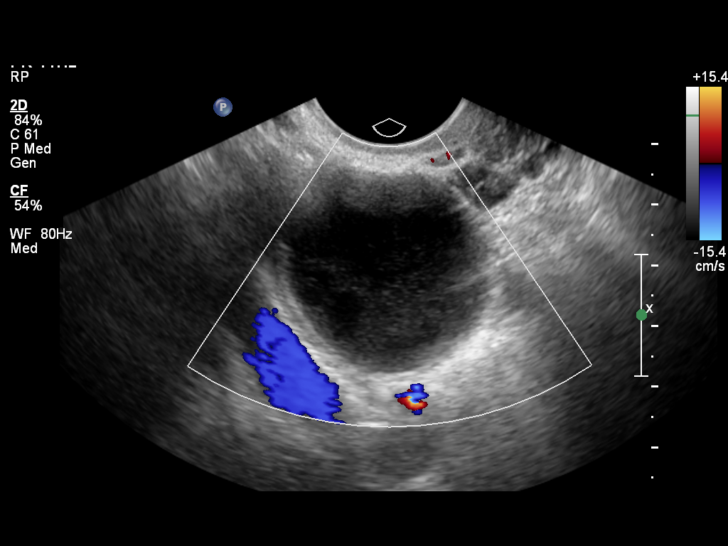
[im 33/45]
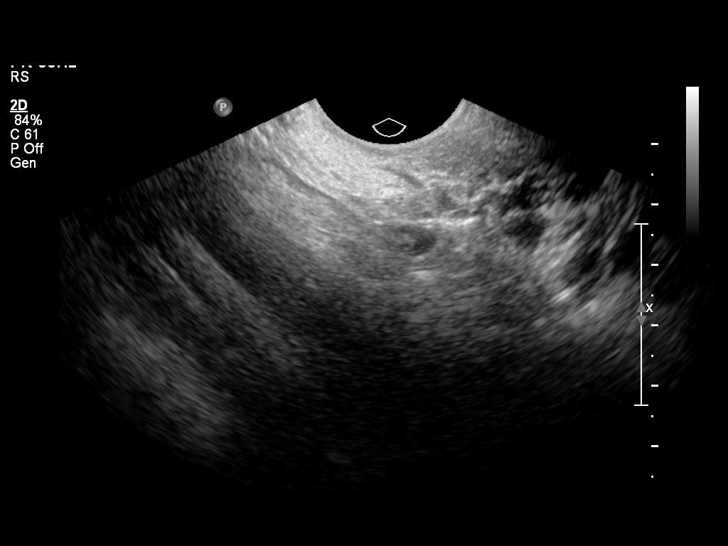
[im 36/45]
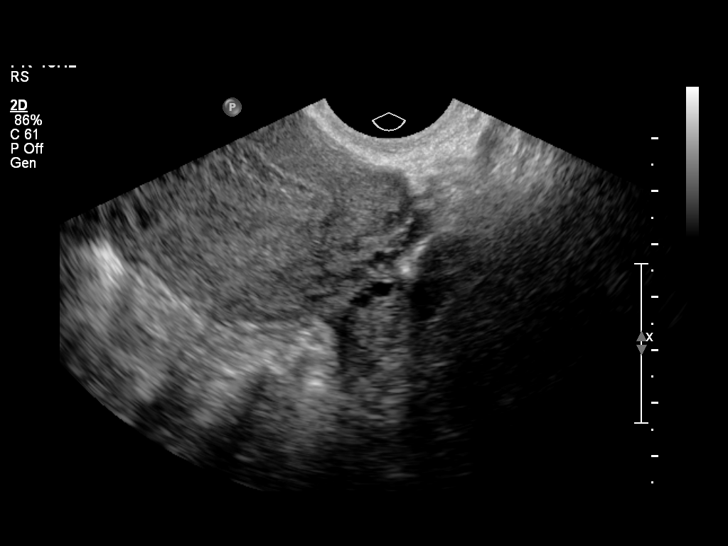
[im 40/45]
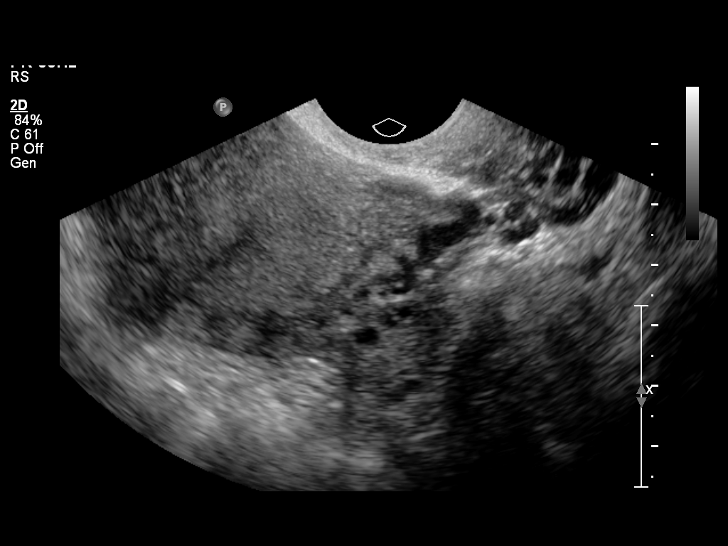
[im 43/45]
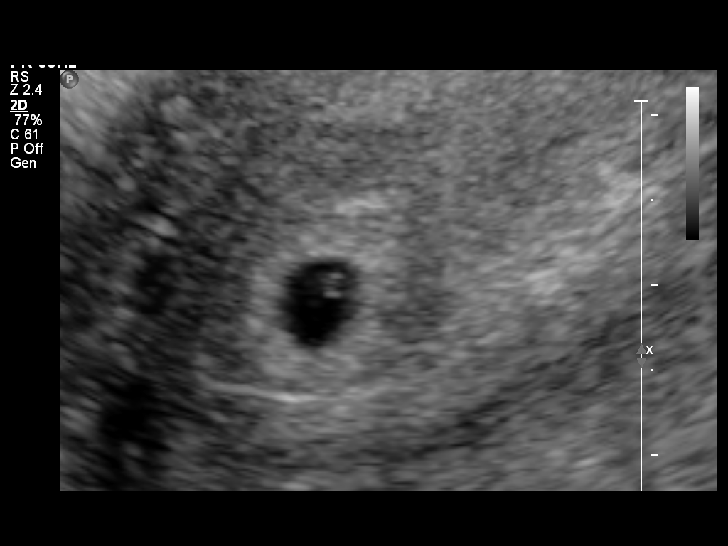

[13 of 28 positions shown; findings below may reference images not displayed]

FINDINGS: Intrauterine gestational sac: Visualized/normal in shape. Located
rather cephalad in the right fundus, approaching cornual location,
but surrounded by endometrium.

Yolk sac:  Present

Embryo:  Not visualized

Cardiac Activity: Not visualized

MSD: 7  mm   5 w   2  d

Maternal uterus/adnexae: Left ovary normal. Enlarged now all mildly
complex cyst right ovary measures 3 x 3 x 3 cm. Small volume free
fluid in the pelvis.
IMPRESSION: There is now visualized a gestational sac with yolk sac, but no
embryo yet. Follow up to document viability, as well as to reassess
for position (near cornua) recommended. Recommend follow-up
quantitative B-HCG levels and follow-up US in 14 days to confirm and
assess viability. This recommendation follows SRU consensus
guidelines: Diagnostic Criteria for Nonviable Pregnancy Early in the
First Trimester. N Engl J Med [OQ]; [DATE].

Enlarged corpus luteum, with small volume free fluid in the pelvis.

## 2015-02-19 NOTE — MAU Provider Note (Signed)
[name]  is a 29 y.o. [phone] at [redacted]w[redacted]d who presents to MAU today for follow-up quant hCG. The patient was initially seen with abdominal pain on 02/11/15. She then returned on 02/13/15 for repeat labs after 48 hours and had appropriate rise in quant hCG. The patient states that she received a letter telling her to return for repeat labs. She has a follow-up US scheduled, but not for another week. She states continued mild cramping pain in the lower abdomen. She denies vaginal bleeding or fever.   BP 114/70 mmHg  Pulse 86  Temp(Src) 97.8 F (36.6 C) (Oral)  Resp 18  LMP 01/11/2015  CONSTITUTIONAL: Well-developed, well-nourished female in no acute distress.  ENT: External right and left ear normal.  EYES: EOM intact, conjunctivae normal.  MUSCULOSKELETAL: Normal range of motion.  CARDIOVASCULAR: Regular heart rate RESPIRATORY: Normal effort NEUROLOGICAL: Alert and oriented to person, place, and time.  SKIN: Skin is warm and dry. No rash noted. Not diaphoretic. No erythema. No pallor. PSYCH: Normal mood and affect. Normal behavior. Normal judgment and thought content.  Results for orders placed or performed during the hospital encounter of 02/19/15 (from the past 24 hour(s))  hCG, quantitative, pregnancy     Status: Abnormal   Collection Time: 02/19/15  5:55 PM  Result Value Ref Range   hCG, Beta Chain, Quant, S 13054 (H) <5 mIU/mL    US Ob Transvaginal  02/19/2015   CLINICAL DATA:  Early pregnancy, pain, followup to 02/11/2015, evaluate pregnancy location, first trimester pregnancy  EXAM: TRANSVAGINAL OB ULTRASOUND  TECHNIQUE: Transvaginal ultrasound was performed for complete evaluation of the gestation as well as the maternal uterus, adnexal regions, and pelvic cul-de-sac.  COMPARISON:  02/11/2015  FINDINGS: Intrauterine gestational sac: Visualized/normal in shape. Located rather cephalad in the right fundus, approaching cornual location, but surrounded by endometrium.  Yolk sac:   Present  Embryo:  Not visualized  Cardiac Activity: Not visualized  MSD: 7  mm   5 w   2  d  Maternal uterus/adnexae: Left ovary normal. Enlarged now all mildly complex cyst right ovary measures 3 x 3 x 3 cm. Small volume free fluid in the pelvis.  IMPRESSION: There is now visualized a gestational sac with yolk sac, but no embryo yet. Follow up to document viability, as well as to reassess for position (near cornua) recommended. Recommend follow-up quantitative B-HCG levels and follow-up US in 14 days to confirm and assess viability. This recommendation follows SRU consensus guidelines: Diagnostic Criteria for Nonviable Pregnancy Early in the First Trimester. Malva Limes Med 2013; 147:8295-62.  Enlarged corpus luteum, with small volume free fluid in the pelvis.   Electronically Signed   By: Esperanza Heir M.D.   On: 02/19/2015 19:04   MDM With patient's history of ectopic pregnancy and IUGS location "cephelad in the right fundus, approaching the cornual location" will have patient repeat US in 1 week as scheduled to confirm pregnancy location and viability  A: IUGS and YS at [redacted]w[redacted]d Abdominal pain in pregnancy  P: Discharge home Tylenol PRN for pain advised Bleeding/first trimester precautions discussed Patient advised to keep [place] appointment for next Monday to confirm viability and pregnancy location Patient may return to MAU as needed or if her condition were to change or worsen   Marny Lowenstein, PA-C 02/19/2015 7:09 PM

## 2015-02-19 NOTE — MAU Note (Signed)
Pt here for F/U BHCG. Pt has mild intermittent cramping, no bleeding.

## 2015-02-19 NOTE — Discharge Instructions (Signed)
First Trimester of Pregnancy The first trimester of pregnancy is from week 1 until the end of week 12 (months 1 through 3). During this time, your baby will begin to develop inside you. At 6-8 weeks, the eyes and face are formed, and the heartbeat can be seen on ultrasound. At the end of 12 weeks, all the baby's organs are formed. Prenatal care is all the medical care you receive before the birth of your baby. Make sure you get good prenatal care and follow all of your doctor's instructions. HOME CARE  Medicines  Take medicine only as told by your doctor. Some medicines are safe and some are not during pregnancy.  Take your prenatal vitamins as told by your doctor.  Take medicine that helps you poop (stool softener) as needed if your doctor says it is okay. Diet  Eat regular, healthy meals.  Your doctor will tell you the amount of weight gain that is right for you.  Avoid raw meat and uncooked cheese.  If you feel sick to your stomach (nauseous) or throw up (vomit):  Eat 4 or 5 small meals a day instead of 3 large meals.  Try eating a few soda crackers.  Drink liquids between meals instead of during meals.  If you have a hard time pooping (constipation):  Eat high-fiber foods like fresh vegetables, fruit, and whole grains.  Drink enough fluids to keep your pee (urine) clear or pale yellow. Activity and Exercise  Exercise only as told by your doctor. Stop exercising if you have cramps or pain in your lower belly (abdomen) or low back.  Try to avoid standing for long periods of time. Move your legs often if you must stand in one place for a long time.  Avoid heavy lifting.  Wear low-heeled shoes. Sit and stand up straight.  You can have sex unless your doctor tells you not to. Relief of Pain or Discomfort  Wear a good support bra if your breasts are sore.  Take warm water baths (sitz baths) to soothe pain or discomfort caused by hemorrhoids. Use hemorrhoid cream if your  doctor says it is okay.  Rest with your legs raised if you have leg cramps or low back pain.  Wear support hose if you have puffy, bulging veins (varicose veins) in your legs. Raise (elevate) your feet for 15 minutes, 3-4 times a day. Limit salt in your diet. Prenatal Care  Schedule your prenatal visits by the twelfth week of pregnancy.  Write down your questions. Take them to your prenatal visits.  Keep all your prenatal visits as told by your doctor. Safety  Wear your seat belt at all times when driving.  Make a list of emergency phone numbers. The list should include numbers for family, friends, the hospital, and police and fire departments. General Tips  Ask your doctor for a referral to a local prenatal class. Begin classes no later than at the start of month 6 of your pregnancy.  Ask for help if you need counseling or help with nutrition. Your doctor can give you advice or tell you where to go for help.  Do not use hot tubs, steam rooms, or saunas.  Do not douche or use tampons or scented sanitary pads.  Do not cross your legs for long periods of time.  Avoid litter boxes and soil used by cats.  Avoid all smoking, herbs, and alcohol. Avoid drugs not approved by your doctor.  Visit your dentist. At home, brush your teeth   with a soft toothbrush. Be gentle when you floss. GET HELP IF:  You are dizzy.  You have mild cramps or pressure in your lower belly.  You have a nagging pain in your belly area.  You continue to feel sick to your stomach, throw up, or have watery poop (diarrhea).  You have a bad smelling fluid coming from your vagina.  You have pain with peeing (urination).  You have increased puffiness (swelling) in your face, hands, legs, or ankles. GET HELP RIGHT AWAY IF:   You have a fever.  You are leaking fluid from your vagina.  You have spotting or bleeding from your vagina.  You have very bad belly cramping or pain.  You gain or lose weight  rapidly.  You throw up blood. It may look like coffee grounds.  You are around people who have German measles, fifth disease, or chickenpox.  You have a very bad headache.  You have shortness of breath.  You have any kind of trauma, such as from a fall or a car accident. Document Released: 12/24/2007 Document Revised: 11/21/2013 Document Reviewed: 05/17/2013 ExitCare Patient Information 2015 [employer]. This information is not intended to replace advice given to you by your health care provider. Make sure you discuss any questions you have with your health care provider.  

## 2015-02-26 ENCOUNTER — Ambulatory Visit (HOSPITAL_COMMUNITY): Payer: BLUE CROSS/BLUE SHIELD

## 2015-03-16 LAB — OB RESULTS CONSOLE ANTIBODY SCREEN: Antibody Screen: NEGATIVE

## 2015-03-16 LAB — OB RESULTS CONSOLE RPR: RPR: NONREACTIVE

## 2015-03-16 LAB — OB RESULTS CONSOLE ABO/RH: RH Type: POSITIVE

## 2015-03-16 LAB — OB RESULTS CONSOLE RUBELLA ANTIBODY, IGM: Rubella: IMMUNE

## 2015-03-16 LAB — OB RESULTS CONSOLE HIV ANTIBODY (ROUTINE TESTING): HIV: NONREACTIVE

## 2015-04-12 LAB — OB RESULTS CONSOLE GC/CHLAMYDIA
Chlamydia: NEGATIVE
Gonorrhea: NEGATIVE

## 2015-05-13 ENCOUNTER — Other Ambulatory Visit: Payer: Self-pay | Admitting: Obstetrics

## 2015-05-14 ENCOUNTER — Other Ambulatory Visit: Payer: Self-pay | Admitting: *Deleted

## 2015-05-14 DIAGNOSIS — A6004 Herpesviral vulvovaginitis: Secondary | ICD-10-CM

## 2015-05-14 MED ORDER — VALACYCLOVIR HCL 1 G PO TABS
1000.0000 mg | ORAL_TABLET | Freq: Every day | ORAL | Status: DC
Start: 2015-05-14 — End: 2023-01-13

## 2015-05-21 ENCOUNTER — Ambulatory Visit (INDEPENDENT_AMBULATORY_CARE_PROVIDER_SITE_OTHER): Payer: BLUE CROSS/BLUE SHIELD | Admitting: Pediatrics

## 2015-05-21 ENCOUNTER — Encounter: Payer: Self-pay | Admitting: Pediatrics

## 2015-05-21 VITALS — BP 100/66 | HR 80 | Temp 98.5°F | Resp 28 | Ht 59.94 in | Wt 146.6 lb

## 2015-05-21 DIAGNOSIS — Z3490 Encounter for supervision of normal pregnancy, unspecified, unspecified trimester: Secondary | ICD-10-CM

## 2015-05-21 DIAGNOSIS — R0602 Shortness of breath: Secondary | ICD-10-CM

## 2015-05-21 DIAGNOSIS — J3089 Other allergic rhinitis: Secondary | ICD-10-CM | POA: Diagnosis not present

## 2015-05-21 MED ORDER — ALBUTEROL SULFATE 108 (90 BASE) MCG/ACT IN AEPB: 2.0000 | INHALATION_SPRAY | RESPIRATORY_TRACT | Status: DC | PRN

## 2015-05-21 NOTE — Patient Instructions (Addendum)
Environmental control of dust and mold Cetirizine 10 mg once a day if needed for runny nose Pro-air Respiclick-2 puffs every 4 hours if needed for wheezing, coughing spells or shortness of breath. If Pro-air is helpful and you need it more than 2 days per week, you may start Pulmicort 180-one puff once a day to prevent coughing or wheezing Gastroesophageal instructions were given and should help your clearing of your  throat If you're having a very stuffy nose, you may use Rhinocort 1 spray per nostril once a day if needed We showed her how to do nasal saline irrigations in the event that she develops a cold.

## 2015-05-21 NOTE — Progress Notes (Signed)
[address] [place] [address] Dept: [phone]  New Patient Note  Patient ID: Andrea PouAriel Gura, female    DOB: [birth year]  Age: 29 y.o. MRN: [id number] Date of Office Visit: 05/21/2015 Referring provider: Fleet ContrasEdwin Avbuere, MD [address] [place] [id number]    Chief Complaint: Breathing Problem  HPI Andrea Pouriel Cowsert presents for evaluation of shortness of breath for about 4 years. She has had more shortness of breath over the past 5 months. She has a history of shortness of breath with exertion for about 4 years. She also has had nasal congestion for several years. Her nasal congestion is perennial and aggravated by exposure to dust ,cigarette smoke and weather changes. She had eczema during her last pregnancy .o Occasionally in the past she has had hives without a clear-cut reason. She does have a postnasal drainage. She has been pregnant for 18 weeks   Review of Systems  Constitutional: Negative.        Shortness of breath with exercise. 18 week pregnancy  HENT: Negative for congestion.        Perennial nasal congestion  Eyes: Negative.   Respiratory: Positive for shortness of breath (going up and down the stairs for 4 years).   Cardiovascular: Negative.   Gastrointestinal: Negative.   Genitourinary: Negative.   Musculoskeletal: Negative.   Skin:       Occasional itching in the past  Neurological: Negative.   Endo/Heme/Allergies:       18 week pregnancy  Psychiatric/Behavioral: Negative.     Outpatient Encounter Prescriptions as of 05/21/2015  Medication Sig  . acetaminophen (TYLENOL) 500 MG tablet Take 500 mg by mouth daily as needed. Takes 1- 2 tablets as needed  . Calcium Carbonate Antacid (TUMS PO) Take 1 tablet by mouth. Takes up to 2 tablets if needed, depending on severity of reflux  . cetirizine (ZYRTEC) 10 MG tablet Take 10 mg by mouth daily as needed for allergies.  . famotidine (PEPCID) 20 MG tablet Take 20 mg by mouth daily as needed for heartburn or  indigestion.  . ondansetron (ZOFRAN-ODT) 4 MG disintegrating tablet Take by mouth daily as needed.  . Prenatal Vit-Fe Fumarate-FA (PNV PRENATAL PLUS MULTIVITAMIN) 27-1 MG TABS Take 1 tablet by mouth daily before breakfast.  . valACYclovir (VALTREX) 1000 MG tablet TAKE 1 TABLET BY MOUTH DAILY FOR SUPPRESSION.  . valACYclovir (VALTREX) 1000 MG tablet Take 1 tablet (1,000 mg total) by mouth daily.  . Albuterol Sulfate (PROAIR RESPICLICK) 108 (90 BASE) MCG/ACT AEPB Inhale 2 puffs into the lungs every 4 (four) hours as needed (coughing spells, wheezing or shortness of breath).   No facility-administered encounter medications on file as of 05/21/2015.     Drug Allergies:  Allergies  Allergen Reactions  . Flagyl [Metronidazole Hcl] Hives and Nausea And Vomiting  . Latex     sensitive    Family History: Poetry's family history includes Cancer in her paternal grandmother; Diabetes in her paternal grandmother; Heart disease in her paternal grandmother; Hypertension in her maternal grandmother and paternal grandmother; Stroke in her maternal grandmother. There is no history of Anesthesia problems, Hypotension, Malignant hyperthermia, Pseudochol deficiency, Allergic rhinitis, Asthma, Angioedema, Eczema, Immunodeficiency, or Urticaria..  She is a CMA  Physical Exam: BP 100/66 mmHg  Pulse 80  Temp(Src) 98.5 F (36.9 C) (Oral)  Resp 28  Ht 4' 11.94" (1.523 m)  Wt 146 lb 9.7 oz (66.5 kg)  BMI 28.67 kg/m2  LMP 01/11/2015   Physical Exam  Constitutional: She is oriented  to person, place, and time. She appears well-developed and well-nourished.  HENT:  Eyes normal. Ears normal. Nose moderate swelling of the nasal turbinates. Pharynx normal.  Neck: Neck supple. No thyromegaly present.  Cardiovascular:  S1 and S2 normal no murmurs  Pulmonary/Chest:  Clear to percussion and auscultation  Abdominal:  Soft. No tenderness. No hepatosplenomegaly. Lower abdominal fullness  Lymphadenopathy:    She  has no cervical adenopathy.  Neurological: She is alert and oriented to person, place, and time.  Skin:  Clear  Psychiatric: She has a normal mood and affect. Her behavior is normal. Judgment and thought content normal.  Vitals reviewed.   Diagnostics: Forced hot capacity 2.25 L FEV1 2.09 L. Predicted FVC  1.72 L predicted FEV1 1.64 L. After albuterol 2 puffs the forced vital capacity became 2.44 L and the FEV1 2.16 L-the spirometry is in the normal range and there was no significant improvement after albuterol   Assessment Assessment and Plan: 1. Shortness of breath   2. Other allergic rhinitis   3. Pregnancy     Meds ordered this encounter  Medications  . Albuterol Sulfate (PROAIR RESPICLICK) 108 (90 BASE) MCG/ACT AEPB    Sig: Inhale 2 puffs into the lungs every 4 (four) hours as needed (coughing spells, wheezing or shortness of breath).    Dispense:  1 each    Refill:  1    Patient Instructions  Environmental control of dust and mold Cetirizine 10 mg once a day if needed for runny nose Pro-air Respiclick-2 puffs every 4 hours if needed for wheezing, coughing spells or shortness of breath. If Pro-air is helpful and you need it more than 2 days per week, you may start Pulmicort 180-one puff once a day to prevent coughing or wheezing Gastroesophageal instructions were given and should help your clearing of your  throat If you're having a very stuffy nose, you may use Rhinocort 1 spray per nostril once a day if needed We showed her how to do nasal saline irrigations in the event that she develops a cold.    Return in about 4 weeks (around 06/18/2015).   Thank you for the opportunity to care for this patient.  Please do not hesitate to contact me with questions.  Tonette Bihari, M.D.  [facility] [address] [place], [place] [address] [phone]

## 2015-06-07 ENCOUNTER — Inpatient Hospital Stay (HOSPITAL_COMMUNITY)
Admission: AD | Admit: 2015-06-07 | Discharge: 2015-06-07 | Disposition: A | Discharge status: Home / Self Care | Payer: BLUE CROSS/BLUE SHIELD | Source: Ambulatory Visit | Attending: Obstetrics & Gynecology | Admitting: Obstetrics & Gynecology

## 2015-06-07 ENCOUNTER — Encounter (HOSPITAL_COMMUNITY): Payer: Self-pay | Admitting: *Deleted

## 2015-06-07 DIAGNOSIS — O26892 Other specified pregnancy related conditions, second trimester: Secondary | ICD-10-CM | POA: Diagnosis present

## 2015-06-07 DIAGNOSIS — N898 Other specified noninflammatory disorders of vagina: Secondary | ICD-10-CM

## 2015-06-07 DIAGNOSIS — Z3A21 21 weeks gestation of pregnancy: Secondary | ICD-10-CM | POA: Insufficient documentation

## 2015-06-07 DIAGNOSIS — N760 Acute vaginitis: Secondary | ICD-10-CM | POA: Diagnosis not present

## 2015-06-07 DIAGNOSIS — B9689 Other specified bacterial agents as the cause of diseases classified elsewhere: Secondary | ICD-10-CM

## 2015-06-07 DIAGNOSIS — O23592 Infection of other part of genital tract in pregnancy, second trimester: Secondary | ICD-10-CM | POA: Insufficient documentation

## 2015-06-07 LAB — WET PREP, GENITAL
Sperm: NONE SEEN
Trich, Wet Prep: NONE SEEN
Yeast Wet Prep HPF POC: NONE SEEN

## 2015-06-07 LAB — AMNISURE RUPTURE OF MEMBRANE (ROM) NOT AT ARMC: Amnisure ROM: NEGATIVE

## 2015-06-07 MED ORDER — CLINDAMYCIN HCL 300 MG PO CAPS: 300.0000 mg | ORAL_CAPSULE | Freq: Two times a day (BID) | ORAL | Status: DC

## 2015-06-07 NOTE — MAU Provider Note (Signed)
History     CSN: [id number]  Arrival date and time: 06/07/15 [id number]   First Provider Initiated Contact with Patient 06/07/15 [phone]      Chief Complaint  Patient presents with  . Rupture of Membranes   HPI   [name] is 29 y.o. female [phone] at 5755w0d presenting to MAU with possible ROM.   At 0645 she felt that her thighs were extremely wet when she went to the bathroom. When she went to get dressed, she felt liquid dripping down her leg to the point where she had to grab a towel. She put a pad on and has not noticed much in the pad.   Last intercourse was Saturday night Denies pain currently, had some cramping 15 minutes ago Denies vaginal bleeding.   OB History    Gravida Para Term Preterm AB TAB SAB Ectopic Multiple Living   7 2 2  4 2 1 1  2       Past Medical History  Diagnosis Date  . Abnormal Pap smear     repeat WNL  . Chlamydia   . Preterm labor   . Urinary tract infection   . MRSA (methicillin resistant staph aureus) culture positive   . Fracture closed, fibula, shaft 2007    left, no surgical repair  . Ovarian cyst   . Herpes genitalis in women   . H/O seasonal allergies   . GERD (gastroesophageal reflux disease)   . Thrombocytopenia (HCC)     2006 DURING PREGNANCY - NO PROBLEMS WITH DELIVERY OR ANY KNOWN PROBLEMS SINCE  . Urticaria     Past Surgical History  Procedure Laterality Date  . Cesarean section    . Wisdom tooth extraction  2004  . I & d of piloniadal cyst in office - local - felt jittery afterwards    . Pilonidal cyst excision N/A 05/05/2014    Procedure:  EXCISION PILONIDAL CYST;  Surgeon: Romie LeveeAlicia Thomas, MD;  Location: [facility];  Service: General;  Laterality: N/A;    Family History  Problem Relation Age of Onset  . Hypertension Maternal Grandmother   . Stroke Maternal Grandmother   . Cancer Paternal Grandmother     brain, lung  . Diabetes Paternal Grandmother   . Heart disease Paternal Grandmother   . Hypertension Paternal  Grandmother   . Anesthesia problems Neg Hx   . Hypotension Neg Hx   . Malignant hyperthermia Neg Hx   . Pseudochol deficiency Neg Hx   . Allergic rhinitis Neg Hx   . Asthma Neg Hx   . Angioedema Neg Hx   . Eczema Neg Hx   . Immunodeficiency Neg Hx   . Urticaria Neg Hx     Social History  Substance Use Topics  . Smoking status: Never Smoker   . Smokeless tobacco: Never Used  . Alcohol Use: No     Comment: occ    Allergies:  Allergies  Allergen Reactions  . Flagyl [Metronidazole Hcl] Hives and Nausea And Vomiting  . Latex     sensitive    Prescriptions prior to admission  Medication Sig Dispense Refill Last Dose  . acetaminophen (TYLENOL) 500 MG tablet Take 500 mg by mouth daily as needed. Takes 1- 2 tablets as needed   Taking  . Albuterol Sulfate (PROAIR RESPICLICK) 108 (90 BASE) MCG/ACT AEPB Inhale 2 puffs into the lungs every 4 (four) hours as needed (coughing spells, wheezing or shortness of breath). 1 each 1   . Calcium Carbonate Antacid (TUMS  PO) Take 1 tablet by mouth. Takes up to 2 tablets if needed, depending on severity of reflux   Taking  . cetirizine (ZYRTEC) 10 MG tablet Take 10 mg by mouth daily as needed for allergies.     . famotidine (PEPCID) 20 MG tablet Take 20 mg by mouth daily as needed for heartburn or indigestion.   Taking  . ondansetron (ZOFRAN-ODT) 4 MG disintegrating tablet Take by mouth daily as needed.  1 Taking  . Prenatal Vit-Fe Fumarate-FA (PNV PRENATAL PLUS MULTIVITAMIN) 27-1 MG TABS Take 1 tablet by mouth daily before breakfast. 30 tablet 11 Taking  . valACYclovir (VALTREX) 1000 MG tablet TAKE 1 TABLET BY MOUTH DAILY FOR SUPPRESSION. 30 tablet 8 Taking  . valACYclovir (VALTREX) 1000 MG tablet Take 1 tablet (1,000 mg total) by mouth daily. 30 tablet 11 Taking   Results for orders placed or performed during the hospital encounter of 06/07/15 (from the past 48 hour(s))  Wet prep, genital     Status: Abnormal   Collection Time: 06/07/15  8:30 AM   Result Value Ref Range   Yeast Wet Prep HPF POC NONE SEEN NONE SEEN   Trich, Wet Prep NONE SEEN NONE SEEN   Clue Cells Wet Prep HPF POC PRESENT (A) NONE SEEN   WBC, Wet Prep HPF POC FEW (A) NONE SEEN    Comment: FEW BACTERIA SEEN   Sperm NONE SEEN   Amnisure rupture of membrane (rom)not at [facility]     Status: None   Collection Time: 06/07/15  8:40 AM  Result Value Ref Range   Amnisure ROM NEGATIVE     Review of Systems  Constitutional: Negative for fever and chills.  Gastrointestinal: Negative for abdominal pain.   Physical Exam   Blood pressure 117/64, pulse 92, temperature 98.2 F (36.8 C), resp. rate 18, last menstrual period 01/11/2015.  Physical Exam  Constitutional: She is oriented to person, place, and time. She appears well-developed and well-nourished. No distress.  HENT:  Head: Normocephalic.  Eyes: Pupils are equal, round, and reactive to light.  Neck: Neck supple.  Respiratory: Effort normal.  GI: Soft.  Genitourinary:  Speculum exam: Vagina - Moderate amount of thick white, clumpy  discharge, no odor. No pooling of fluid in the vagina.  Cervix - No contact bleeding Bimanual exam: Cervix closed wet prep done Chaperone present for exam.  Musculoskeletal: Normal range of motion.  Neurological: She is alert and oriented to person, place, and time.  Skin: Skin is warm. She is not diaphoretic.  Psychiatric: Her behavior is normal.    MAU Course  Procedures  None  MDM  Amnisure negative Wet prep + fetal  Heart tones by doppler  Discussed patient with Dr. Charlotta Newton   Assessment and Plan   A:  1. Vaginal discharge during pregnancy in second trimester   2. BV (bacterial vaginosis)     P:  Discharge home in stable condition RX: Clindamycin (patient allergic to flagyl) Return to MAU as needed, if symptoms worsen  Follow up with Dr. Charlotta Newton as scheduled    Duane Lope, NP 06/07/2015 5:15 PM

## 2015-06-07 NOTE — Discharge Instructions (Signed)

## 2015-06-07 NOTE — MAU Note (Signed)
Pt stated she thinks her water might have broke. [facility]. Denies pain or cramping reports fetal movement.

## 2015-07-22 NOTE — L&D Delivery Note (Signed)
Delivery Note At 10:21 AM a viable female was delivered via VBAC, Spontaneous (Presentation: ; Occiput Anterior).  APGAR: 9, 9; weight 6 lb 4 oz (2835 g).   Placenta status: Intact, Spontaneous.  Cord: 3 vessels with the following complications: None.    Anesthesia: Epidural  Episiotomy: None Lacerations: None Suture Repair: N/A Est. Blood Loss (mL): 350  Mom to postpartum.  Baby to Couplet care / Skin to Skin.  Myna HidalgoZAN, Brittnae Aschenbrenner, M 10/16/2015, 1:29 PM

## 2015-09-05 ENCOUNTER — Encounter (HOSPITAL_COMMUNITY): Payer: Self-pay

## 2015-09-05 ENCOUNTER — Inpatient Hospital Stay (HOSPITAL_COMMUNITY)
Admission: AD | Admit: 2015-09-05 | Discharge: 2015-09-05 | Disposition: A | Discharge status: Home / Self Care | Payer: BLUE CROSS/BLUE SHIELD | Source: Ambulatory Visit | Attending: Obstetrics & Gynecology | Admitting: Obstetrics & Gynecology

## 2015-09-05 DIAGNOSIS — K5901 Slow transit constipation: Secondary | ICD-10-CM

## 2015-09-05 DIAGNOSIS — R1032 Left lower quadrant pain: Secondary | ICD-10-CM | POA: Diagnosis not present

## 2015-09-05 DIAGNOSIS — Z3A34 34 weeks gestation of pregnancy: Secondary | ICD-10-CM | POA: Diagnosis not present

## 2015-09-05 DIAGNOSIS — K5900 Constipation, unspecified: Secondary | ICD-10-CM | POA: Diagnosis not present

## 2015-09-05 DIAGNOSIS — K219 Gastro-esophageal reflux disease without esophagitis: Secondary | ICD-10-CM | POA: Diagnosis not present

## 2015-09-05 DIAGNOSIS — Z881 Allergy status to other antibiotic agents status: Secondary | ICD-10-CM | POA: Insufficient documentation

## 2015-09-05 DIAGNOSIS — Z91040 Latex allergy status: Secondary | ICD-10-CM | POA: Insufficient documentation

## 2015-09-05 DIAGNOSIS — O26893 Other specified pregnancy related conditions, third trimester: Secondary | ICD-10-CM | POA: Diagnosis not present

## 2015-09-05 DIAGNOSIS — O9989 Other specified diseases and conditions complicating pregnancy, childbirth and the puerperium: Secondary | ICD-10-CM

## 2015-09-05 LAB — URINALYSIS, ROUTINE W REFLEX MICROSCOPIC
Bilirubin Urine: NEGATIVE
Glucose, UA: NEGATIVE mg/dL
Hgb urine dipstick: NEGATIVE
Ketones, ur: NEGATIVE mg/dL
Leukocytes, UA: NEGATIVE
Nitrite: NEGATIVE
Protein, ur: NEGATIVE mg/dL
Specific Gravity, Urine: 1.02 (ref 1.005–1.030)
pH: 6 (ref 5.0–8.0)

## 2015-09-05 MED ORDER — POLYETHYLENE GLYCOL 3350 17 G PO PACK: 17.0000 g | PACK | Freq: Every day | ORAL | Status: DC

## 2015-09-05 NOTE — MAU Provider Note (Signed)
Chief Complaint:  No chief complaint on file.   Provider initiated care at 1957 hrs on 09/05/15   HPI   Andrea Dean is a 30 y.o. [id number] at 92w6dwho presents to maternity admissions reporting Left lower quadrant pain.  Has had some constipation. Taking colace but has taken no laxatives She reports good fetal movement, denies LOF, vaginal bleeding, vaginal itching/burning, urinary symptoms, h/a, dizziness, n/v, diarrhea or fever/chills.  She denies headache, visual changes.  RN Note: Pt states that since lunch time today she began having left lower abd pain that radiates to her left lower abd. States she has been having cramping and hip pain but this pain is new. Tylenol did not help the pain at all. Denies bleeding.        Past Medical History: Past Medical History  Diagnosis Date  . Abnormal Pap smear     repeat WNL  . Chlamydia   . Preterm labor   . Urinary tract infection   . MRSA (methicillin resistant staph aureus) culture positive   . Fracture closed, fibula, shaft 2007    left, no surgical repair  . Ovarian cyst   . Herpes genitalis in women   . H/O seasonal allergies   . GERD (gastroesophageal reflux disease)   . Thrombocytopenia (HCC)     2006 DURING PREGNANCY - NO PROBLEMS WITH DELIVERY OR ANY KNOWN PROBLEMS SINCE  . Urticaria     Past obstetric history: OB History  Gravida Para Term Preterm AB SAB TAB Ectopic Multiple Living  # Outcome Date GA Lbr Len/2nd Weight Sex Delivery Anes PTL Lv  7 Current           6 Term 12/28/07     VBAC   Y     Comments: IOL for oligo & SGA per patient  5 Term 07/21/04     CS-Unspec   Y     Comments: c/section for failure to progress  4 Ectopic           3 SAB           2 TAB           1 TAB               Past Surgical History: Past Surgical History  Procedure Laterality Date  . Cesarean section    . Wisdom tooth extraction  2004  . I & d of piloniadal cyst in office - local - felt jittery  afterwards    . Pilonidal cyst excision N/A 05/05/2014    Procedure:  EXCISION PILONIDAL CYST;  Surgeon: Romie Levee, MD;  Location: [facility];  Service: General;  Laterality: N/A;    Family History: Family History  Problem Relation Age of Onset  . Hypertension Maternal Grandmother   . Stroke Maternal Grandmother   . Cancer Paternal Grandmother     brain, lung  . Diabetes Paternal Grandmother   . Heart disease Paternal Grandmother   . Hypertension Paternal Grandmother   . Anesthesia problems Neg Hx   . Hypotension Neg Hx   . Malignant hyperthermia Neg Hx   . Pseudochol deficiency Neg Hx   . Allergic rhinitis Neg Hx   . Asthma Neg Hx   . Angioedema Neg Hx   . Eczema Neg Hx   . Immunodeficiency Neg Hx   . Urticaria Neg Hx     Social History: Social History  Substance Use Topics  .  Smoking status: Never Smoker   . Smokeless tobacco: Never Used  . Alcohol Use: No     Comment: occ    Allergies:  Allergies  Allergen Reactions  . Flagyl [Metronidazole Hcl] Hives and Nausea And Vomiting  . Latex     sensitive    Meds:  Prescriptions prior to admission  Medication Sig Dispense Refill Last Dose  . acetaminophen (TYLENOL) 500 MG tablet Take 500 mg by mouth daily as needed. Takes 1- 2 tablets as needed   09/05/2015 at Unknown time  . Calcium Carbonate Antacid (TUMS PO) Take 1 tablet by mouth. Takes up to 2 tablets if needed, depending on severity of reflux   Past Month at Unknown time  . famotidine (PEPCID) 20 MG tablet Take 20 mg by mouth daily as needed for heartburn or indigestion.   Past Month at Unknown time  . Prenatal Vit-Fe Fumarate-FA (PNV PRENATAL PLUS MULTIVITAMIN) 27-1 MG TABS Take 1 tablet by mouth daily before breakfast. 30 tablet 11 09/04/2015 at Unknown time  . valACYclovir (VALTREX) 1000 MG tablet TAKE 1 TABLET BY MOUTH DAILY FOR SUPPRESSION. 30 tablet 8 09/04/2015 at Unknown time  . Albuterol Sulfate (PROAIR RESPICLICK) 108 (90 BASE) MCG/ACT AEPB Inhale 2 puffs  into the lungs every 4 (four) hours as needed (coughing spells, wheezing or shortness of breath). 1 each 1   . cetirizine (ZYRTEC) 10 MG tablet Take 10 mg by mouth daily as needed for allergies.   More than a month at Unknown time  . clindamycin (CLEOCIN) 300 MG capsule Take 1 capsule (300 mg total) by mouth 2 (two) times daily with a meal. 14 capsule 0   . valACYclovir (VALTREX) 1000 MG tablet Take 1 tablet (1,000 mg total) by mouth daily. 30 tablet 11 Taking    I have reviewed patient's Past Medical Hx, Surgical Hx, Family Hx, Social Hx, medications and allergies.   ROS:  Review of Systems  Constitutional: Negative for fever, chills, appetite change and fatigue.  Gastrointestinal: Positive for abdominal pain and constipation. Negative for nausea, vomiting and diarrhea.  Genitourinary: Negative for dysuria, frequency, vaginal bleeding, vaginal discharge and difficulty urinating.  Musculoskeletal: Negative for back pain.   Other systems negative   Physical Exam  Patient Vitals for the past 24 hrs:  BP Temp Temp src Pulse Resp SpO2 Height Weight  09/05/15 1931 124/68 mmHg 98.1 F (36.7 C) Oral 93 16 100 % 5' 2.5" (1.588 m) 158 lb (71.668 kg)   Constitutional: Well-developed, well-nourished female in no acute distress.  Cardiovascular: normal rate and rhythm Respiratory: normal effort, clear to auscultation bilaterally GI: Abd soft, non-tender, gravid appropriate for gestational age.   No rebound or guarding.  Mildly tender left lower quadrant MS: Extremities nontender, no edema, normal ROM Neurologic: Alert and oriented x 4.  GU: Neg CVAT.  PELVIC EXAM: Cervix firm, posterior, neg CMT, uterus nontender, Fundal Height consistent with dates, adnexa without tenderness, enlargement, or mass Dilation: Closed Exam by:: Artelia Laroche CNM     FHT:  Baseline 140 , moderate variability, accelerations present, no decelerations Contractions:  Irregular, has had about 4 in past hour    Labs: Results for orders placed or performed during the hospital encounter of 09/05/15 (from the past 24 hour(s))  Urinalysis, Routine w reflex microscopic (not at [facility])     Status: None   Collection Time: 09/05/15  7:32 PM  Result Value Ref Range   Color, Urine YELLOW YELLOW   APPearance CLEAR CLEAR   Specific  Gravity, Urine 1.020 1.005 - 1.030   pH 6.0 5.0 - 8.0   Glucose, UA NEGATIVE NEGATIVE mg/dL   Hgb urine dipstick NEGATIVE NEGATIVE   Bilirubin Urine NEGATIVE NEGATIVE   Ketones, ur NEGATIVE NEGATIVE mg/dL   Protein, ur NEGATIVE NEGATIVE mg/dL   Nitrite NEGATIVE NEGATIVE   Leukocytes, UA NEGATIVE NEGATIVE      Imaging:  No results found.  MAU Course/MDM: I have ordered labs and reviewed results.  NST reviewed Consult Dr Charlotta Newton with presentation, exam findings and test results.  Treatments in MAU included none.    Assessment: [facility]UP at [redacted]w[redacted]d  Irregular contractions, not in labor Constipation  Plan: Discharge home Preterm Labor precautions and fetal kick counts Rx given for Miralax as needed for constipation. High fiber diet and fluids Follow up in Office for prenatal visits and recheck of cervix    Medication List    ASK your doctor about these medications        acetaminophen 500 MG tablet  Commonly known as:  TYLENOL  Take 500 mg by mouth daily as needed. Takes 1- 2 tablets as needed     Albuterol Sulfate 108 (90 Base) MCG/ACT Aepb  Commonly known as:  PROAIR RESPICLICK  Inhale 2 puffs into the lungs every 4 (four) hours as needed (coughing spells, wheezing or shortness of breath).     cetirizine 10 MG tablet  Commonly known as:  ZYRTEC  Take 10 mg by mouth daily as needed for allergies.     clindamycin 300 MG capsule  Commonly known as:  CLEOCIN  Take 1 capsule (300 mg total) by mouth 2 (two) times daily with a meal.     famotidine 20 MG tablet  Commonly known as:  PEPCID  Take 20 mg by mouth daily as needed for heartburn or indigestion.     PNV  PRENATAL PLUS MULTIVITAMIN 27-1 MG Tabs  Take 1 tablet by mouth daily before breakfast.     TUMS PO  Take 1 tablet by mouth. Takes up to 2 tablets if needed, depending on severity of reflux     valACYclovir 1000 MG tablet  Commonly known as:  VALTREX  Take 1 tablet (1,000 mg total) by mouth daily.     valACYclovir 1000 MG tablet  Commonly known as:  VALTREX  TAKE 1 TABLET BY MOUTH DAILY FOR SUPPRESSION.       Pt stable at time of discharge.  Wynelle Bourgeois CNM, MSN Certified Nurse-Midwife 09/05/2015 7:57 PM

## 2015-09-05 NOTE — Discharge Instructions (Signed)

## 2015-09-05 NOTE — MAU Note (Signed)
Pt states that since lunch time today she began having left lower abd pain that radiates to her left lower abd. States she has been having cramping and hip pain but this pain is new. Tylenol did not help the pain at all. Denies bleeding.

## 2015-09-13 LAB — OB RESULTS CONSOLE GBS: GBS: NEGATIVE

## 2015-09-18 ENCOUNTER — Telehealth: Payer: Self-pay | Admitting: Pediatrics

## 2015-09-18 NOTE — Telephone Encounter (Signed)
WILL FILE MCD

## 2015-09-18 NOTE — Telephone Encounter (Signed)
Has a question about his wife's bill.

## 2015-10-14 ENCOUNTER — Encounter (HOSPITAL_COMMUNITY): Payer: Self-pay

## 2015-10-14 ENCOUNTER — Inpatient Hospital Stay (HOSPITAL_COMMUNITY)
Admission: AD | Admit: 2015-10-14 | Discharge: 2015-10-14 | Disposition: A | Discharge status: Home / Self Care | Payer: BLUE CROSS/BLUE SHIELD | Source: Ambulatory Visit | Attending: Obstetrics & Gynecology | Admitting: Obstetrics & Gynecology

## 2015-10-14 ENCOUNTER — Telehealth: Payer: Self-pay

## 2015-10-14 ENCOUNTER — Inpatient Hospital Stay (HOSPITAL_COMMUNITY): Payer: BLUE CROSS/BLUE SHIELD

## 2015-10-14 DIAGNOSIS — Z3493 Encounter for supervision of normal pregnancy, unspecified, third trimester: Secondary | ICD-10-CM

## 2015-10-14 DIAGNOSIS — O288 Other abnormal findings on antenatal screening of mother: Secondary | ICD-10-CM

## 2015-10-14 IMAGING — US US MFM FETAL BPP W/O NON-STRESS
1 series · 5 of 5 positions shown · non-contrast
Comparison: none

[Series 1: us mfm fetal bpp w/o non-stress · 5 acquisitions, 5 frames shown]
[im 1/5]
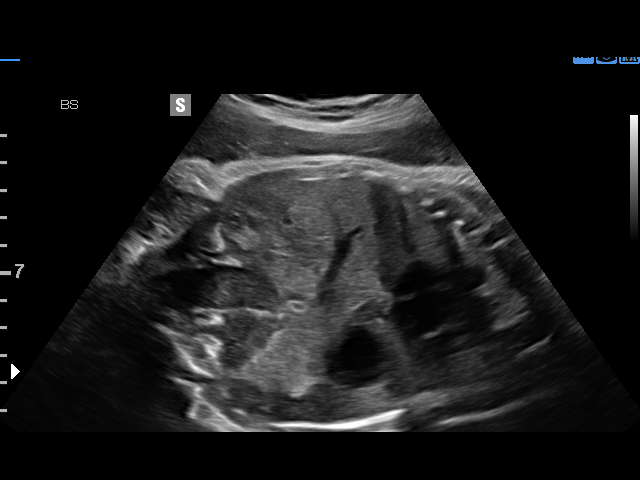
[im 2/5]
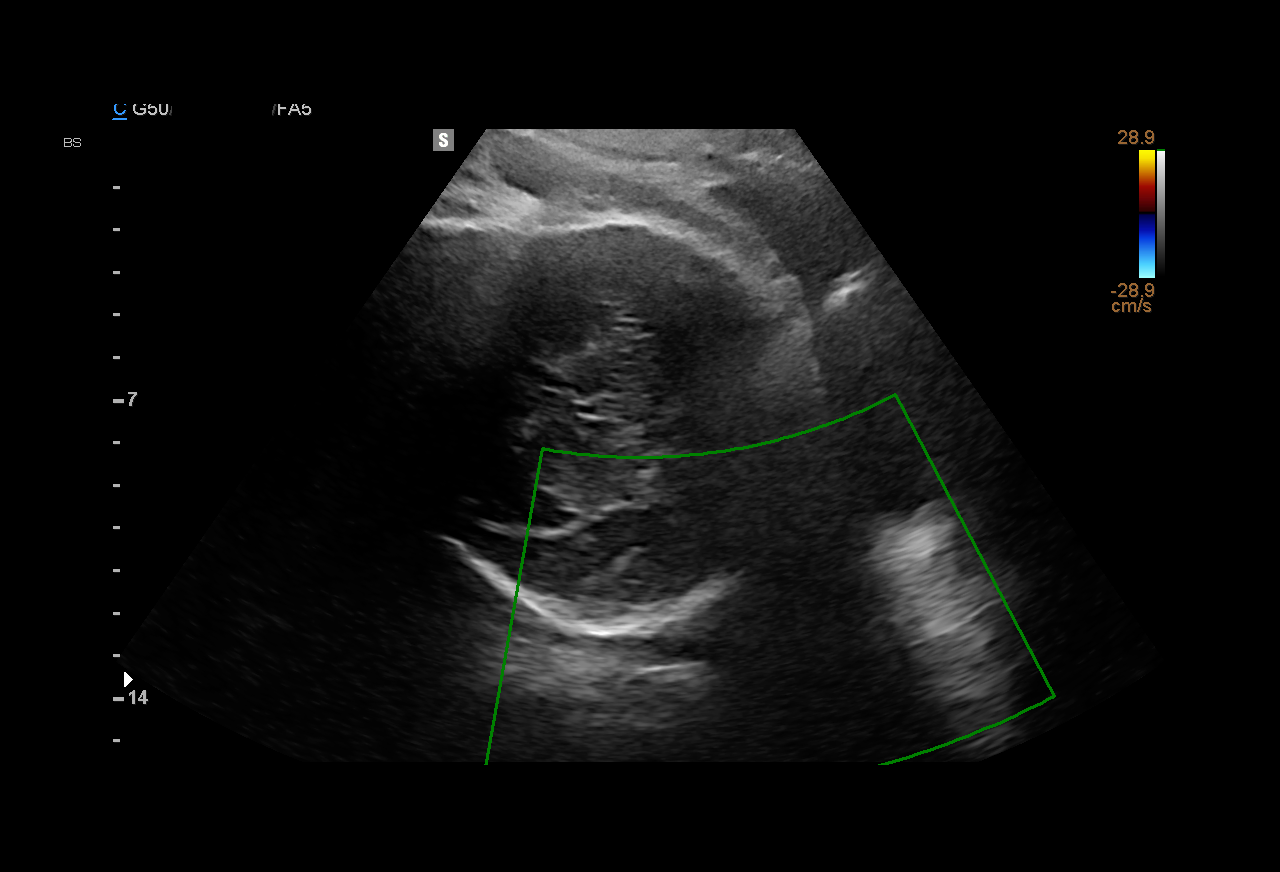
[im 3/5]
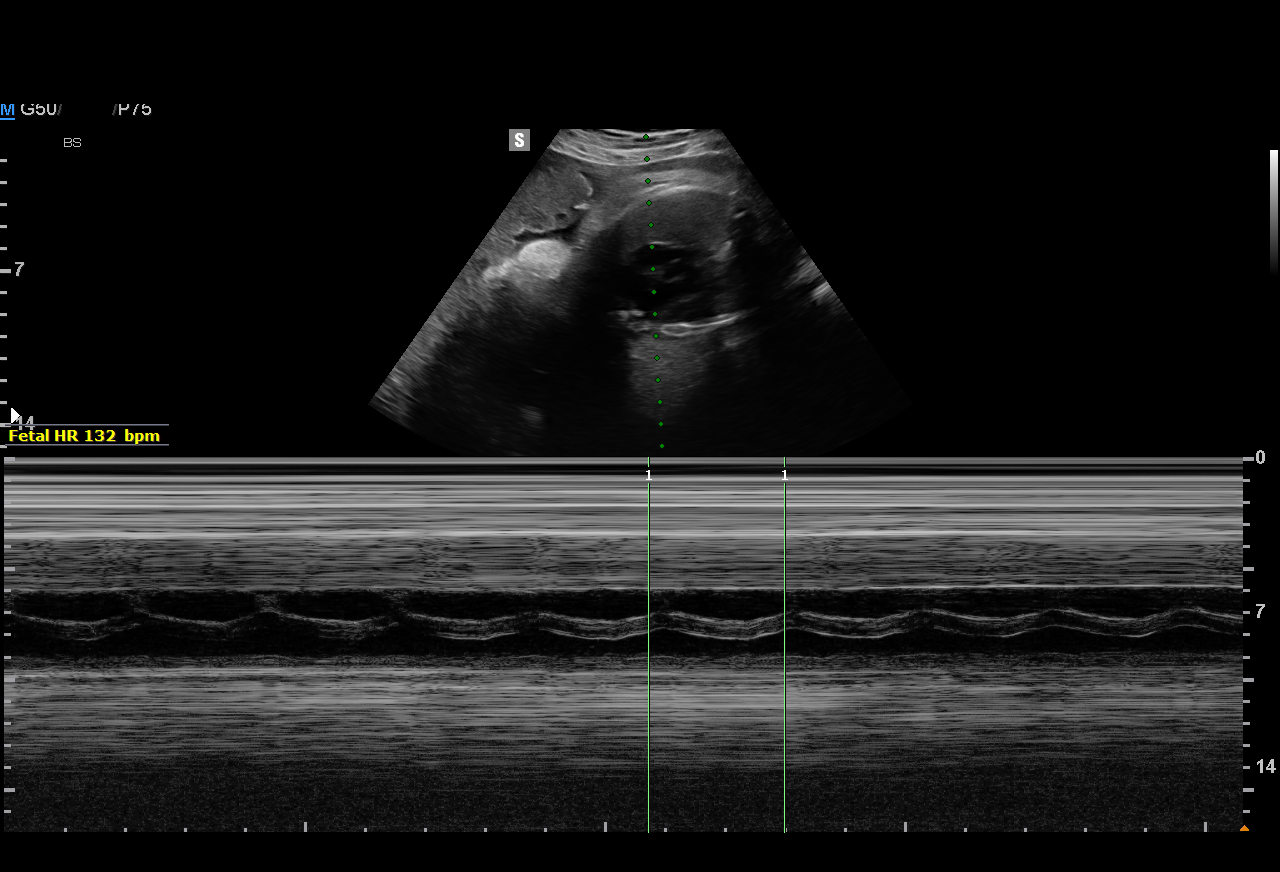
[im 4/5]
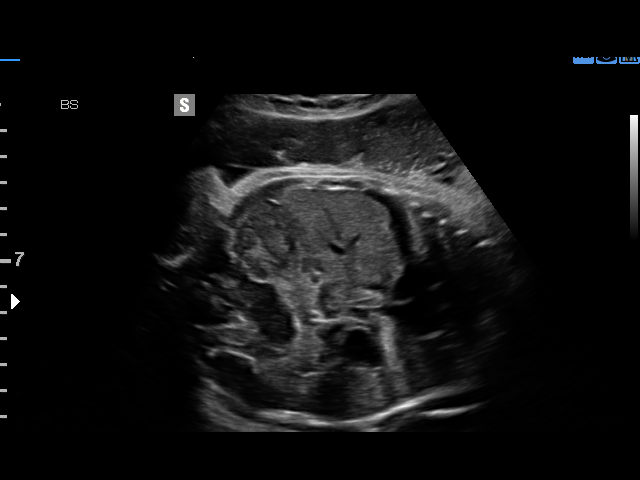
[im 5/5]
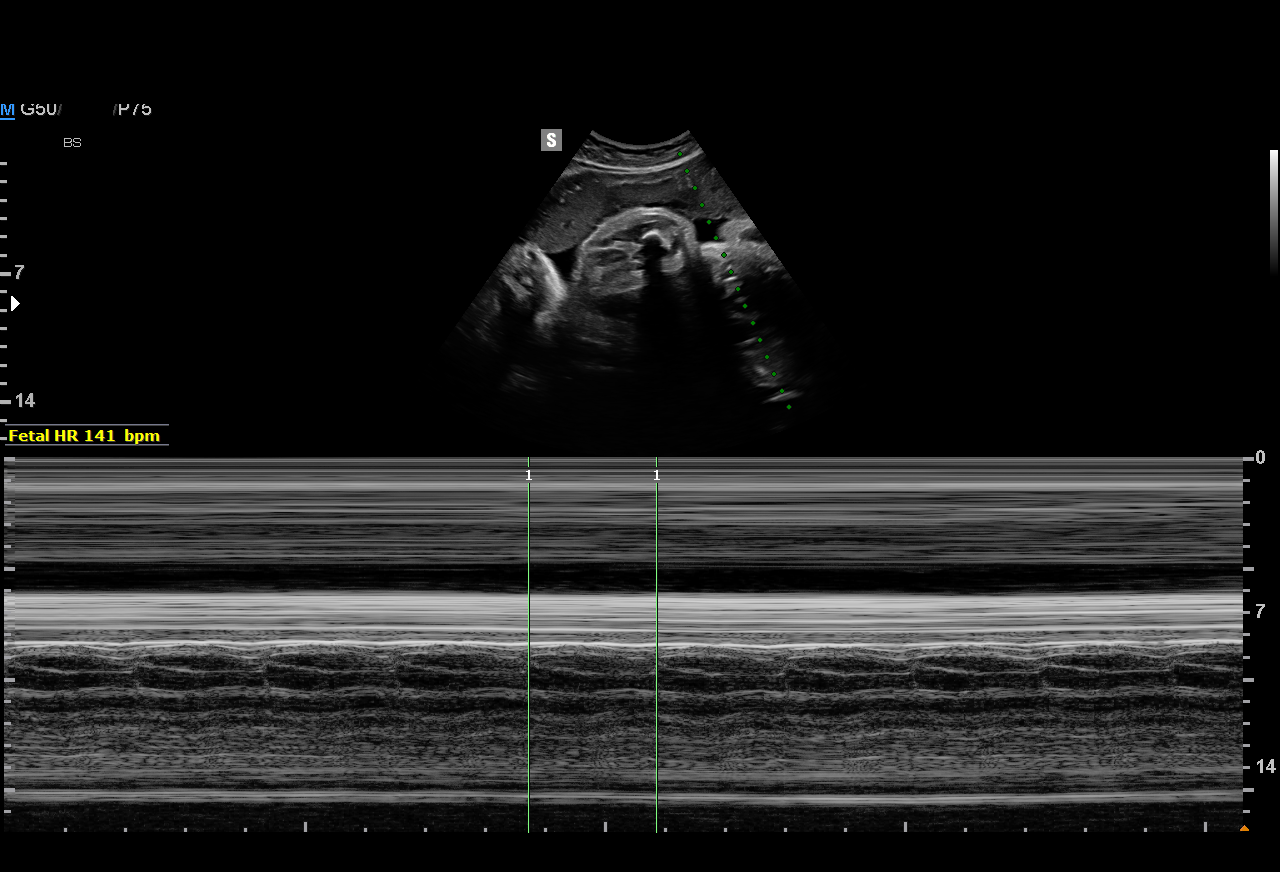

[5 of 5 positions shown; findings below may reference images not displayed]

Attending:        [facility]      Secondary Phy.:    [facility]-
MAU/Triage

1  [facility]             [PHONE_NUMBER]      [PHONE_NUMBER]     [PHONE_NUMBER]
Indications

Non-reactive NST                               [YL]
39 weeks gestation of pregnancy
OB History

Gravidity:    7         Term:   2        Prem:   0        SAB:   1
TOP:          2       Ectopic:  1        Living: 2
Fetal Evaluation

Num Of Fetuses:     1
Fetal Heart         132
Rate(bpm):
Cardiac Activity:   Observed
Presentation:       Cephalic

Amniotic Fluid
AFI FV:      Subjectively within normal limits
AFI Sum:     11.04   cm       38  %Tile     Larg Pckt:    7.06  cm
RUQ:   7.06    cm   RLQ:    0      cm    LUQ:   0.99    cm   LLQ:    2.99   cm
Biophysical Evaluation

Amniotic F.V:   Pocket => 2 cm two         F. Tone:         Observed
planes
F. Movement:    Not Observed               Score:           [DATE]
F. Breathing:   Observed
Gestational Age
LMP:           39w 3d        Date:  [DATE]                 EDD:   [DATE]
Best:          39w 3d     Det. By:  LMP  ([DATE])          EDD:   [DATE]
Comments

This is a remote read.  This study was discussed with
sonographer immediately after completion.  A preliminary
report was faxed  to the [facility] and placed in [REDACTED] soon after
completion.
Impression

Single living intrauterine pregnancy at 39 weeks 3 days.
Normal amniotic fluid volume.
BPP [DATE] (-2 for lack of fetal movement).
Recommendations

Correlate ultrasound BPP score with NST.
If total BPP score is [DATE] I recommend delivery.

## 2015-10-14 NOTE — Telephone Encounter (Signed)
Patient states that she started having contractions around 1830 and was irregular.  Patient states contractions are now q 5 minutes with lower back pain, pelvic pressure, and good fetal movement.  Patient states that contractions are mild, but are consistent.  Patient denies LOF and VB.  Patient reports membrane stripping on Thursday with an exam of 2/70.  Patient reports good hydration, but "not quite 64 oz."  Patient states that she has moved around, laid down, and nothing changes frequency of contractions. Patient reports living approximately 10 minutes from the hospital. Patient reports desire for natural birth.  Given option to remain at home, hydrate, and rest to see if condition progresses or report to MAU for evaluation.  Patient states she will discuss with husband and decide.  Informed that provider readily available. JE, CNM

## 2015-10-14 NOTE — MAU Note (Signed)
Contractions started at 6:30 pm.  Consistent at 9 pm, every 5 min.  No bleeding. No leaking.  Stripped membranes on Thursday.  Baby moving well.

## 2015-10-14 NOTE — Discharge Instructions (Signed)
Fetal Movement Counts °Patient Name: __________________________________________________ Patient Due Date: ____________________ °Performing a fetal movement count is highly recommended in high-risk pregnancies, but it is good for every pregnant woman to do. Your health care provider may ask you to start counting fetal movements at 28 weeks of the pregnancy. Fetal movements often increase: °· After eating a full meal. °· After physical activity. °· After eating or drinking something sweet or cold. °· At rest. °Pay attention to when you feel the baby is most active. This will help you notice a pattern of your baby's sleep and wake cycles and what factors contribute to an increase in fetal movement. It is important to perform a fetal movement count at the same time each day when your baby is normally most active.  °HOW TO COUNT FETAL MOVEMENTS °1. Find a quiet and comfortable area to sit or lie down on your left side. Lying on your left side provides the best blood and oxygen circulation to your baby. °2. Write down the day and time on a sheet of paper or in a journal. °3. Start counting kicks, flutters, swishes, rolls, or jabs in a 2-hour period. You should feel at least 10 movements within 2 hours. °4. If you do not feel 10 movements in 2 hours, wait 2-3 hours and count again. Look for a change in the pattern or not enough counts in 2 hours. °SEEK MEDICAL CARE IF: °· You feel less than 10 counts in 2 hours, tried twice. °· There is no movement in over an hour. °· The pattern is changing or taking longer each day to reach 10 counts in 2 hours. °· You feel the baby is not moving as he or she usually does. °Date: ____________ Movements: ____________ Start time: ____________ Finish time: ____________  °Date: ____________ Movements: ____________ Start time: ____________ Finish time: ____________ °Date: ____________ Movements: ____________ Start time: ____________ Finish time: ____________ °Date: ____________ Movements:  ____________ Start time: ____________ Finish time: ____________ °Date: ____________ Movements: ____________ Start time: ____________ Finish time: ____________ °Date: ____________ Movements: ____________ Start time: ____________ Finish time: ____________ °Date: ____________ Movements: ____________ Start time: ____________ Finish time: ____________ °Date: ____________ Movements: ____________ Start time: ____________ Finish time: ____________  °Date: ____________ Movements: ____________ Start time: ____________ Finish time: ____________ °Date: ____________ Movements: ____________ Start time: ____________ Finish time: ____________ °Date: ____________ Movements: ____________ Start time: ____________ Finish time: ____________ °Date: ____________ Movements: ____________ Start time: ____________ Finish time: ____________ °Date: ____________ Movements: ____________ Start time: ____________ Finish time: ____________ °Date: ____________ Movements: ____________ Start time: ____________ Finish time: ____________ °Date: ____________ Movements: ____________ Start time: ____________ Finish time: ____________  °Date: ____________ Movements: ____________ Start time: ____________ Finish time: ____________ °Date: ____________ Movements: ____________ Start time: ____________ Finish time: ____________ °Date: ____________ Movements: ____________ Start time: ____________ Finish time: ____________ °Date: ____________ Movements: ____________ Start time: ____________ Finish time: ____________ °Date: ____________ Movements: ____________ Start time: ____________ Finish time: ____________ °Date: ____________ Movements: ____________ Start time: ____________ Finish time: ____________ °Date: ____________ Movements: ____________ Start time: ____________ Finish time: ____________  °Date: ____________ Movements: ____________ Start time: ____________ Finish time: ____________ °Date: ____________ Movements: ____________ Start time: ____________ Finish  time: ____________ °Date: ____________ Movements: ____________ Start time: ____________ Finish time: ____________ °Date: ____________ Movements: ____________ Start time: ____________ Finish time: ____________ °Date: ____________ Movements: ____________ Start time: ____________ Finish time: ____________ °Date: ____________ Movements: ____________ Start time: ____________ Finish time: ____________ °Date: ____________ Movements: ____________ Start time: ____________ Finish time: ____________  °Date: ____________ Movements: ____________ Start time: ____________ Finish   time: ____________ °Date: ____________ Movements: ____________ Start time: ____________ Finish time: ____________ °Date: ____________ Movements: ____________ Start time: ____________ Finish time: ____________ °Date: ____________ Movements: ____________ Start time: ____________ Finish time: ____________ °Date: ____________ Movements: ____________ Start time: ____________ Finish time: ____________ °Date: ____________ Movements: ____________ Start time: ____________ Finish time: ____________ °Date: ____________ Movements: ____________ Start time: ____________ Finish time: ____________  °Date: ____________ Movements: ____________ Start time: ____________ Finish time: ____________ °Date: ____________ Movements: ____________ Start time: ____________ Finish time: ____________ °Date: ____________ Movements: ____________ Start time: ____________ Finish time: ____________ °Date: ____________ Movements: ____________ Start time: ____________ Finish time: ____________ °Date: ____________ Movements: ____________ Start time: ____________ Finish time: ____________ °Date: ____________ Movements: ____________ Start time: ____________ Finish time: ____________ °Date: ____________ Movements: ____________ Start time: ____________ Finish time: ____________  °Date: ____________ Movements: ____________ Start time: ____________ Finish time: ____________ °Date: ____________  Movements: ____________ Start time: ____________ Finish time: ____________ °Date: ____________ Movements: ____________ Start time: ____________ Finish time: ____________ °Date: ____________ Movements: ____________ Start time: ____________ Finish time: ____________ °Date: ____________ Movements: ____________ Start time: ____________ Finish time: ____________ °Date: ____________ Movements: ____________ Start time: ____________ Finish time: ____________ °Date: ____________ Movements: ____________ Start time: ____________ Finish time: ____________  °Date: ____________ Movements: ____________ Start time: ____________ Finish time: ____________ °Date: ____________ Movements: ____________ Start time: ____________ Finish time: ____________ °Date: ____________ Movements: ____________ Start time: ____________ Finish time: ____________ °Date: ____________ Movements: ____________ Start time: ____________ Finish time: ____________ °Date: ____________ Movements: ____________ Start time: ____________ Finish time: ____________ °Date: ____________ Movements: ____________ Start time: ____________ Finish time: ____________ °  °This information is not intended to replace advice given to you by your health care provider. Make sure you discuss any questions you have with your health care provider. °  °Document Released: 08/06/2006 Document Revised: 07/28/2014 Document Reviewed: 05/03/2012 °Elsevier Interactive Patient Education ©2016 [employer] °Braxton Hicks Contractions °Contractions of the uterus can occur throughout pregnancy. Contractions are not always a sign that you are in labor.  °WHAT ARE BRAXTON HICKS CONTRACTIONS?  °Contractions that occur before labor are called Braxton Hicks contractions, or false labor. Toward the end of pregnancy (32-34 weeks), these contractions can develop more often and may become more forceful. This is not true labor because these contractions do not result in opening (dilatation) and thinning of  the cervix. They are sometimes difficult to tell apart from true labor because these contractions can be forceful and people have different pain tolerances. You should not feel embarrassed if you go to the hospital with false labor. Sometimes, the only way to tell if you are in true labor is for your health care provider to look for changes in the cervix. °If there are no prenatal problems or other health problems associated with the pregnancy, it is completely safe to be sent home with false labor and await the onset of true labor. °HOW CAN YOU TELL THE DIFFERENCE BETWEEN TRUE AND FALSE LABOR? °False Labor °· The contractions of false labor are usually shorter and not as hard as those of true labor.   °· The contractions are usually irregular.   °· The contractions are often felt in the front of the lower abdomen and in the groin.   °· The contractions may go away when you walk around or change positions while lying down.   °· The contractions get weaker and are shorter lasting as time goes on.   °· The contractions do not usually become progressively stronger, regular, and closer together as with true labor.   °True Labor °5. Contractions in true   labor last 30-70 seconds, become very regular, usually become more intense, and increase in frequency.  6. The contractions do not go away with walking.  7. The discomfort is usually felt in the top of the uterus and spreads to the lower abdomen and low back.  8. True labor can be determined by your health care provider with an exam. This will show that the cervix is dilating and getting thinner.  WHAT TO REMEMBER  Keep up with your usual exercises and follow other instructions given by your health care provider.   Take medicines as directed by your health care provider.   Keep your regular prenatal appointments.   Eat and drink lightly if you think you are going into labor.   If Braxton Hicks contractions are making you uncomfortable:   Change  your position from lying down or resting to walking, or from walking to resting.   Sit and rest in a tub of warm water.   Drink 2-3 glasses of water. Dehydration may cause these contractions.   Do slow and deep breathing several times an hour.  WHEN SHOULD I SEEK IMMEDIATE MEDICAL CARE? Seek immediate medical care if:  Your contractions become stronger, more regular, and closer together.   You have fluid leaking or gushing from your vagina.   You have a fever.   You pass blood-tinged mucus.   You have vaginal bleeding.   You have continuous abdominal pain.   You have low back pain that you never had before.   You feel your baby's head pushing down and causing pelvic pressure.   Your baby is not moving as much as it used to.    This information is not intended to replace advice given to you by your health care provider. Make sure you discuss any questions you have with your health care provider.   Document Released: 07/07/2005 Document Revised: 07/12/2013 Document Reviewed: 04/18/2013 Elsevier Interactive Patient Education [employer]2016 [employer] Third Trimester of Pregnancy The third trimester is from week 29 through week 42, months 7 through 9. This trimester is when your unborn baby (fetus) is growing very fast. At the end of the ninth month, the unborn baby is about 20 inches in length. It weighs about 6-10 pounds.  HOME CARE   Avoid all smoking, herbs, and alcohol. Avoid drugs not approved by your doctor.  Do not use any tobacco products, including cigarettes, chewing tobacco, and electronic cigarettes. If you need help quitting, ask your doctor. You may get counseling or other support to help you quit.  Only take medicine as told by your doctor. Some medicines are safe and some are not during pregnancy.  Exercise only as told by your doctor. Stop exercising if you start having cramps.  Eat regular, healthy meals.  Wear a good support bra if your breasts are  tender.  Do not use hot tubs, steam rooms, or saunas.  Wear your seat belt when driving.  Avoid raw meat, uncooked cheese, and liter boxes and soil used by cats.  Take your prenatal vitamins.  Take 1500-2000 milligrams of calcium daily starting at the 20th week of pregnancy until you deliver your baby.  Try taking medicine that helps you poop (stool softener) as needed, and if your doctor approves. Eat more fiber by eating fresh fruit, vegetables, and whole grains. Drink enough fluids to keep your pee (urine) clear or pale yellow.  Take warm water baths (sitz baths) to soothe pain or discomfort caused by hemorrhoids. Use hemorrhoid  cream if your doctor approves.  If you have puffy, bulging veins (varicose veins), wear support hose. Raise (elevate) your feet for 15 minutes, 3-4 times a day. Limit salt in your diet.  Avoid heavy lifting, wear low heels, and sit up straight.  Rest with your legs raised if you have leg cramps or low back pain.  Visit your dentist if you have not gone during your pregnancy. Use a soft toothbrush to brush your teeth. Be gentle when you floss.  You can have sex (intercourse) unless your doctor tells you not to.  Do not travel far distances unless you must. Only do so with your doctor's approval.  Take prenatal classes.  Practice driving to the hospital.  Pack your hospital bag.  Prepare the baby's room.  Go to your doctor visits. GET HELP IF: 9. You are not sure if you are in labor or if your water has broken. 10. You are dizzy. 11. You have mild cramps or pressure in your lower belly (abdominal). 12. You have a nagging pain in your belly area. 13. You continue to feel sick to your stomach (nauseous), throw up (vomit), or have watery poop (diarrhea). 14. You have bad smelling fluid coming from your vagina. 15. You have pain with peeing (urination). GET HELP RIGHT AWAY IF:   You have a fever.  You are leaking fluid from your vagina.  You  are spotting or bleeding from your vagina.  You have severe belly cramping or pain.  You lose or gain weight rapidly.  You have trouble catching your breath and have chest pain.  You notice sudden or extreme puffiness (swelling) of your face, hands, ankles, feet, or legs.  You have not felt the baby move in over an hour.  You have severe headaches that do not go away with medicine.  You have vision changes.   This information is not intended to replace advice given to you by your health care provider. Make sure you discuss any questions you have with your health care provider.   Document Released: 10/01/2009 Document Revised: 07/28/2014 Document Reviewed: 09/07/2012 Elsevier Interactive Patient Education [employer]

## 2015-10-15 ENCOUNTER — Encounter (HOSPITAL_COMMUNITY): Payer: Self-pay | Admitting: Obstetrics

## 2015-10-15 ENCOUNTER — Inpatient Hospital Stay (HOSPITAL_COMMUNITY)
Admission: AD | Admit: 2015-10-15 | Discharge: 2015-10-18 | DRG: 774 | Disposition: A | Discharge status: Home / Self Care | Payer: BLUE CROSS/BLUE SHIELD | Source: Ambulatory Visit | Attending: Obstetrics & Gynecology | Admitting: Obstetrics & Gynecology

## 2015-10-15 ENCOUNTER — Other Ambulatory Visit (HOSPITAL_COMMUNITY): Payer: Self-pay | Admitting: Obstetrics and Gynecology

## 2015-10-15 DIAGNOSIS — Z8249 Family history of ischemic heart disease and other diseases of the circulatory system: Secondary | ICD-10-CM | POA: Diagnosis not present

## 2015-10-15 DIAGNOSIS — Z91040 Latex allergy status: Secondary | ICD-10-CM

## 2015-10-15 DIAGNOSIS — O9962 Diseases of the digestive system complicating childbirth: Secondary | ICD-10-CM | POA: Diagnosis present

## 2015-10-15 DIAGNOSIS — Z833 Family history of diabetes mellitus: Secondary | ICD-10-CM

## 2015-10-15 DIAGNOSIS — K219 Gastro-esophageal reflux disease without esophagitis: Secondary | ICD-10-CM | POA: Diagnosis present

## 2015-10-15 DIAGNOSIS — O34211 Maternal care for low transverse scar from previous cesarean delivery: Secondary | ICD-10-CM | POA: Diagnosis present

## 2015-10-15 DIAGNOSIS — O368130 Decreased fetal movements, third trimester, not applicable or unspecified: Secondary | ICD-10-CM | POA: Diagnosis present

## 2015-10-15 DIAGNOSIS — A6000 Herpesviral infection of urogenital system, unspecified: Secondary | ICD-10-CM | POA: Diagnosis present

## 2015-10-15 DIAGNOSIS — O9832 Other infections with a predominantly sexual mode of transmission complicating childbirth: Secondary | ICD-10-CM | POA: Diagnosis present

## 2015-10-15 DIAGNOSIS — Z3A39 39 weeks gestation of pregnancy: Secondary | ICD-10-CM | POA: Diagnosis not present

## 2015-10-15 DIAGNOSIS — O288 Other abnormal findings on antenatal screening of mother: Secondary | ICD-10-CM | POA: Diagnosis present

## 2015-10-15 DIAGNOSIS — Z823 Family history of stroke: Secondary | ICD-10-CM | POA: Diagnosis not present

## 2015-10-15 LAB — TYPE AND SCREEN
ABO/RH(D): O POS
Antibody Screen: NEGATIVE

## 2015-10-15 LAB — CBC
HCT: 34.2 % — ABNORMAL LOW (ref 36.0–46.0)
Hemoglobin: 11.4 g/dL — ABNORMAL LOW (ref 12.0–15.0)
MCH: 31.2 pg (ref 26.0–34.0)
MCHC: 33.3 g/dL (ref 30.0–36.0)
MCV: 93.7 fL (ref 78.0–100.0)
Platelets: 215 10*3/uL (ref 150–400)
RBC: 3.65 MIL/uL — ABNORMAL LOW (ref 3.87–5.11)
RDW: 15.1 % (ref 11.5–15.5)
WBC: 10.2 10*3/uL (ref 4.0–10.5)

## 2015-10-15 MED ORDER — ONDANSETRON HCL 4 MG/2ML IJ SOLN
4.0000 mg | Freq: Four times a day (QID) | INTRAMUSCULAR | Status: DC | PRN
  Administered 2015-10-16: 4 mg via INTRAVENOUS
  Filled 2015-10-15: qty 2

## 2015-10-15 MED ORDER — OXYCODONE-ACETAMINOPHEN 5-325 MG PO TABS: 1.0000 | ORAL_TABLET | ORAL | Status: DC | PRN

## 2015-10-15 MED ORDER — FENTANYL CITRATE (PF) 100 MCG/2ML IJ SOLN: 50.0000 ug | INTRAMUSCULAR | Status: DC | PRN

## 2015-10-15 MED ORDER — FENTANYL 2.5 MCG/ML BUPIVACAINE 1/10 % EPIDURAL INFUSION (WH - ANES)
14.0000 mL/h | INTRAMUSCULAR | Status: DC | PRN
  Administered 2015-10-16: 13 mL/h via EPIDURAL
  Filled 2015-10-15: qty 125

## 2015-10-15 MED ORDER — OXYTOCIN 10 UNIT/ML IJ SOLN
1.0000 m[IU]/min | INTRAVENOUS | Status: DC
  Administered 2015-10-15: 2 m[IU]/min via INTRAVENOUS
  Administered 2015-10-16: 666 m[IU]/min via INTRAVENOUS
  Filled 2015-10-15: qty 10

## 2015-10-15 MED ORDER — EPHEDRINE 5 MG/ML INJ
10.0000 mg | INTRAVENOUS | Status: DC | PRN
  Filled 2015-10-15: qty 2

## 2015-10-15 MED ORDER — OXYCODONE-ACETAMINOPHEN 5-325 MG PO TABS: 2.0000 | ORAL_TABLET | ORAL | Status: DC | PRN

## 2015-10-15 MED ORDER — TERBUTALINE SULFATE 1 MG/ML IJ SOLN
0.2500 mg | Freq: Once | INTRAMUSCULAR | Status: DC | PRN
Start: 2015-10-15 — End: 2015-10-16
  Filled 2015-10-15: qty 1

## 2015-10-15 MED ORDER — OXYTOCIN BOLUS FROM INFUSION
500.0000 mL | INTRAVENOUS | Status: DC
Start: 2015-10-15 — End: 2015-10-16

## 2015-10-15 MED ORDER — DIPHENHYDRAMINE HCL 50 MG/ML IJ SOLN: 12.5000 mg | INTRAMUSCULAR | Status: DC | PRN

## 2015-10-15 MED ORDER — ACETAMINOPHEN 325 MG PO TABS: 650.0000 mg | ORAL_TABLET | ORAL | Status: DC | PRN

## 2015-10-15 MED ORDER — OXYTOCIN 10 UNIT/ML IJ SOLN: 2.5000 [IU]/h | INTRAVENOUS | Status: DC

## 2015-10-15 MED ORDER — PHENYLEPHRINE 40 MCG/ML (10ML) SYRINGE FOR IV PUSH (FOR BLOOD PRESSURE SUPPORT)
80.0000 ug | PREFILLED_SYRINGE | INTRAVENOUS | Status: DC | PRN
Start: 2015-10-15 — End: 2015-10-16
  Filled 2015-10-15: qty 2

## 2015-10-15 MED ORDER — PHENYLEPHRINE 40 MCG/ML (10ML) SYRINGE FOR IV PUSH (FOR BLOOD PRESSURE SUPPORT)
80.0000 ug | PREFILLED_SYRINGE | INTRAVENOUS | Status: DC | PRN
Start: 2015-10-15 — End: 2015-10-16
  Filled 2015-10-15: qty 20
  Filled 2015-10-15: qty 2

## 2015-10-15 MED ORDER — LACTATED RINGERS IV SOLN: 500.0000 mL | INTRAVENOUS | Status: DC | PRN

## 2015-10-15 MED ORDER — LACTATED RINGERS IV SOLN
500.0000 mL | Freq: Once | INTRAVENOUS | Status: AC
  Administered 2015-10-16: 500 mL via INTRAVENOUS

## 2015-10-15 MED ORDER — CITRIC ACID-SODIUM CITRATE 334-500 MG/5ML PO SOLN: 30.0000 mL | ORAL | Status: DC | PRN

## 2015-10-15 MED ORDER — LIDOCAINE HCL (PF) 1 % IJ SOLN
30.0000 mL | INTRAMUSCULAR | Status: DC | PRN
  Filled 2015-10-15: qty 30

## 2015-10-15 MED ORDER — LACTATED RINGERS IV SOLN
INTRAVENOUS | Status: DC
  Administered 2015-10-15 – 2015-10-16 (×2): via INTRAVENOUS

## 2015-10-15 NOTE — H&P (Signed)
Andrea Dean is a 30 y.o. female  G7 [phone] at 39 wks and 4 days based on LMP of 01/11/2016 confirmed by 7 wk u/s with [facility] 10/18/2015. 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 Pt has had a successful VBAC. Prenatal care is provided by Dr. Myna Hidalgo with [facility]. Pt presented to the office today complaining of decreased fetal movement. SHe had a non-reactive NST in the office and is sent over induction.  She reports irregular contractions. No vaginal bleeding or LOF. She would like to attempt TOLAC.  Marland Kitchen She denies any active HSV lesions. She has been taking valtrex for suppression.    History OB History    Gravida Para Term Preterm AB TAB SAB Ectopic Multiple Living   Past Medical History  Diagnosis Date  . Abnormal Pap smear     repeat WNL  . Chlamydia   . Preterm labor   . Urinary tract infection   . MRSA (methicillin resistant staph aureus) culture positive   . Fracture closed, fibula, shaft 2007    left, no surgical repair  . Ovarian cyst   . Herpes genitalis in women   . H/O seasonal allergies   . GERD (gastroesophageal reflux disease)   . Thrombocytopenia (HCC)     2006 DURING PREGNANCY - NO PROBLEMS WITH DELIVERY OR ANY KNOWN PROBLEMS SINCE  . Urticaria    Past Surgical History  Procedure Laterality Date  . Cesarean section    . Wisdom tooth extraction  2004  . I & d of piloniadal cyst in office - local - felt jittery afterwards    . Pilonidal cyst excision N/A 05/05/2014    Procedure:  EXCISION PILONIDAL CYST;  Surgeon: Romie Levee, MD;  Location: [facility] ORS;  Service: General;  Laterality: N/A;   Family History: family history includes Cancer in her paternal grandmother; Diabetes in her paternal grandmother; Heart disease in her paternal grandmother; Hypertension in her maternal grandmother and paternal grandmother; Stroke in her maternal grandmother. There is no history of Anesthesia problems, Hypotension, Malignant  hyperthermia, Pseudochol deficiency, Allergic rhinitis, Asthma, Angioedema, Eczema, Immunodeficiency, or Urticaria. Social History:  reports that she has never smoked. She has never used smokeless tobacco. She reports that she does not drink alcohol or use illicit drugs. Medication. Valtrex and PNV Allergies Flagyl  And Latex    Prenatal Transfer Tool  Maternal Diabetes: No Genetic Screening: Normal Maternal Ultrasounds/Referrals: Normal Fetal Ultrasounds or other Referrals:  None Maternal Substance Abuse:  No Significant Maternal Medications:  Meds include: Other:  None Significant Maternal Lab Results:  Lab values include: Group B Strep negative Other Comments:  None  Review of Systems  Constitutional: Negative.   HENT: Negative.   Eyes: Negative.   Respiratory: Negative.   Cardiovascular: Negative.   Gastrointestinal: Negative.   Genitourinary: Negative.   Musculoskeletal: Negative.   Skin: Negative.   Neurological: Negative.   Endo/Heme/Allergies: Negative.   Psychiatric/Behavioral: Negative.     Dilation: 2.5 Effacement (%): 70 Station: -2, -3 Exam by:: h stone rnc Blood pressure 126/78, pulse 83, temperature 97.9 F (36.6 C), temperature source Oral, resp. rate 17, height  (1.6 m), weight 162 lb (73.483 kg), last menstrual period 01/11/2015. Maternal Exam:  Uterine Assessment: Contraction frequency is irregular.   Abdomen: Patient reports no abdominal tenderness. Surgical scars: low transverse.   Estimated fetal weight is 7 lbs .   Fetal presentation:  vertex  Introitus: Normal vulva. Normal vagina.  Pelvis: adequate for delivery.      Fetal Exam Fetal Monitor Review: Baseline rate: 140.  Variability: moderate (6-25 bpm).   Pattern: accelerations present.    Fetal State Assessment: Category I - tracings are normal.     Physical Exam  Constitutional: She is oriented to person, place, and time. She appears well-developed.  HENT:  Head:  Normocephalic and atraumatic.  Eyes: Conjunctivae are normal. Pupils are equal, round, and reactive to light.  Neck: Normal range of motion. Neck supple.  Cardiovascular: Normal rate and regular rhythm.   Respiratory: Effort normal and breath sounds normal.  GI: Bowel sounds are normal. There is no tenderness.  Genitourinary: Vagina normal.  Musculoskeletal: Normal range of motion. She exhibits no edema.  Neurological: She is alert and oriented to person, place, and time. She has normal reflexes.  Skin: Skin is warm and dry.  Psychiatric: She has a normal mood and affect.    Prenatal labs: ABO, Rh: --/--/O POS (03/27 1619) Antibody: NEG (03/27 1619) Rubella: Immune (08/26 0000) RPR: Nonreactive (08/26 0000)  HBsAg:   Negative  HIV: Non-reactive (08/26 0000)  GBS: Negative (02/23 0000)   Assessment/Plan: 39 wks and 4 days with decreased fetal movement and Nonreactive NST.Marland Kitchen. Favorable cervix will admit for TOLAC. D/w pt r/o TOLAC including 1% r/o uterine rupture possible r/o resulting fetal or maternal morbidity/ mortality. .She voiced understanding and excepts this risk .  -plan induction with pitocin. HSV2- No active infection on exam  GBS negative Dr. Charlotta Newtonzan wil assume care 10/16/2015 at 7 am    Heiley Shaikh J. 10/15/2015, 5:35 PM

## 2015-10-15 NOTE — Progress Notes (Signed)
Andrea Dean is a 30 y.o. [id number] at 7561w4d by ultrasound admitted for induction of labor due to Non-reactive NST.  Subjective: Pt concerned about increase in pitocin. She rates contractions as 3 out 10. No lof no vaginal bleeding   Objective: BP 127/75 mmHg  Pulse 85  Temp(Src) 98.3 F (36.8 C) (Oral)  Resp 18  Ht 5\' 3"  (1.6 m)  Wt 162 lb (73.483 kg)  BMI 28.70 kg/m2  LMP 01/11/2015      FHT:  FHR: 140 bpm, variability: moderate,  accelerations:  Present,  decelerations:  Absent and   UC:   everyy 2-5 minutes  SVE:   Dilation: 3 Effacement (%): 50 Station: -3 Exam by:: dr Richardson Doppcole  Labs: Lab Results  Component Value Date   WBC 10.2 10/15/2015   HGB 11.4* 10/15/2015   HCT 34.2* 10/15/2015   MCV 93.7 10/15/2015   PLT 215 10/15/2015    Assessment / Plan: Induction of labor due to non-reassuring fetal testing,  progressing well on pitocin  Labor: Progressing on Pitocin, will continue to increase then AROM Preeclampsia:  NA Fetal Wellbeing:  Category I Pain Control:  Labor support without medications I/D:  n/a Anticipated MOD:  NSVD  Clenton Esper J. 10/15/2015, 6:39 PM

## 2015-10-15 NOTE — Progress Notes (Signed)
Angelica Pouriel Greth is a 30 y.o. [id number] at 6569w4d by LMP admitted for induction of labor due to Non-reactive NST.  Subjective: Pt feeling more uncomfortable with contractions. Rates pain as 5 out 10.. No lof no vaginal bleeding   Objective: BP 130/72 mmHg  Pulse 90  Temp(Src) 98.2 F (36.8 C) (Oral)  Resp 16  Ht 5\' 3"  (1.6 m)  Wt 162 lb (73.483 kg)  BMI 28.70 kg/m2  LMP 01/11/2015      FHT:  FHR: 140 bpm, variability: moderate,  accelerations:  Present,  decelerations:  Absent UC:   regular, every 2-3 minutes SVE:   Dilation: 3 Effacement (%): 50 Station: -3 Exam by:: Dr.Eugena Rhue  Labs: Lab Results  Component Value Date   WBC 10.2 10/15/2015   HGB 11.4* 10/15/2015   HCT 34.2* 10/15/2015   MCV 93.7 10/15/2015   PLT 215 10/15/2015    Assessment / Plan: induction of labor due to nonreactive nst in the office at term... Continue pitocin  Fetal station too high for rupture at this time  Fetal Well being Category 1  Anticipate SVD  Willard Madrigal J. 10/15/2015, 9:25 PM

## 2015-10-16 ENCOUNTER — Encounter (HOSPITAL_COMMUNITY): Payer: Self-pay | Admitting: Anesthesiology

## 2015-10-16 ENCOUNTER — Inpatient Hospital Stay (HOSPITAL_COMMUNITY): Payer: BLUE CROSS/BLUE SHIELD | Admitting: Anesthesiology

## 2015-10-16 LAB — RPR: RPR Ser Ql: NONREACTIVE

## 2015-10-16 MED ORDER — METHYLERGONOVINE MALEATE 0.2 MG PO TABS
0.2000 mg | ORAL_TABLET | ORAL | Status: DC
  Administered 2015-10-16 – 2015-10-17 (×4): 0.2 mg via ORAL
  Filled 2015-10-16 (×4): qty 1

## 2015-10-16 MED ORDER — LIDOCAINE HCL (PF) 1 % IJ SOLN
INTRAMUSCULAR | Status: DC | PRN
  Administered 2015-10-16 (×2): 4 mL via EPIDURAL

## 2015-10-16 MED ORDER — LANOLIN HYDROUS EX OINT: TOPICAL_OINTMENT | CUTANEOUS | Status: DC | PRN

## 2015-10-16 MED ORDER — MISOPROSTOL 200 MCG PO TABS
800.0000 ug | ORAL_TABLET | Freq: Once | ORAL | Status: AC
  Administered 2015-10-16: 800 ug via RECTAL

## 2015-10-16 MED ORDER — OXYCODONE-ACETAMINOPHEN 5-325 MG PO TABS
1.0000 | ORAL_TABLET | ORAL | Status: DC | PRN
  Administered 2015-10-16: 1 via ORAL
  Filled 2015-10-16: qty 1

## 2015-10-16 MED ORDER — WITCH HAZEL-GLYCERIN EX PADS
1.0000 "application " | MEDICATED_PAD | CUTANEOUS | Status: DC | PRN
  Administered 2015-10-16: 1 via TOPICAL

## 2015-10-16 MED ORDER — SIMETHICONE 80 MG PO CHEW: 80.0000 mg | CHEWABLE_TABLET | ORAL | Status: DC | PRN

## 2015-10-16 MED ORDER — BENZOCAINE-MENTHOL 20-0.5 % EX AERO
1.0000 "application " | INHALATION_SPRAY | CUTANEOUS | Status: DC | PRN
  Administered 2015-10-16: 1 via TOPICAL
  Filled 2015-10-16: qty 56

## 2015-10-16 MED ORDER — MISOPROSTOL 200 MCG PO TABS
200.0000 ug | ORAL_TABLET | Freq: Once | ORAL | Status: AC
  Administered 2015-10-16: 200 ug via RECTAL

## 2015-10-16 MED ORDER — METHYLERGONOVINE MALEATE 0.2 MG/ML IJ SOLN
0.2000 mg | Freq: Once | INTRAMUSCULAR | Status: AC
  Administered 2015-10-16: 0.2 mg via INTRAMUSCULAR

## 2015-10-16 MED ORDER — OXYTOCIN 10 UNIT/ML IJ SOLN
10.0000 [IU]/h | INTRAVENOUS | Status: DC
  Administered 2015-10-16: 10 [IU]/h via INTRAVENOUS

## 2015-10-16 MED ORDER — METHYLERGONOVINE MALEATE 0.2 MG/ML IJ SOLN: 0.2000 mg | INTRAMUSCULAR | Status: DC

## 2015-10-16 MED ORDER — IBUPROFEN 600 MG PO TABS
600.0000 mg | ORAL_TABLET | Freq: Four times a day (QID) | ORAL | Status: DC
  Administered 2015-10-16 – 2015-10-18 (×10): 600 mg via ORAL
  Filled 2015-10-16 (×10): qty 1

## 2015-10-16 MED ORDER — LACTATED RINGERS IV SOLN
INTRAVENOUS | Status: AC
  Administered 2015-10-16: 16:00:00
  Filled 2015-10-16: qty 4

## 2015-10-16 MED ORDER — ACETAMINOPHEN 325 MG PO TABS: 650.0000 mg | ORAL_TABLET | ORAL | Status: DC | PRN

## 2015-10-16 MED ORDER — PRENATAL MULTIVITAMIN CH
1.0000 | ORAL_TABLET | Freq: Every day | ORAL | Status: DC
  Administered 2015-10-17 – 2015-10-18 (×2): 1 via ORAL
  Filled 2015-10-16 (×2): qty 1

## 2015-10-16 MED ORDER — ZOLPIDEM TARTRATE 5 MG PO TABS: 5.0000 mg | ORAL_TABLET | Freq: Every evening | ORAL | Status: DC | PRN

## 2015-10-16 MED ORDER — SODIUM CHLORIDE 0.9% FLUSH
3.0000 mL | Freq: Three times a day (TID) | INTRAVENOUS | Status: DC
  Administered 2015-10-16: 3 mL via INTRAVENOUS

## 2015-10-16 MED ORDER — DIBUCAINE 1 % RE OINT
1.0000 "application " | TOPICAL_OINTMENT | RECTAL | Status: DC | PRN
  Administered 2015-10-17: 1 via RECTAL
  Filled 2015-10-16: qty 28

## 2015-10-16 MED ORDER — MISOPROSTOL 200 MCG PO TABS
ORAL_TABLET | ORAL | Status: AC
Start: 2015-10-16 — End: 2015-10-17
  Filled 2015-10-16: qty 1

## 2015-10-16 MED ORDER — METHYLERGONOVINE MALEATE 0.2 MG/ML IJ SOLN
INTRAMUSCULAR | Status: AC
  Filled 2015-10-16: qty 3

## 2015-10-16 MED ORDER — MISOPROSTOL 200 MCG PO TABS
ORAL_TABLET | ORAL | Status: AC
Start: 2015-10-16 — End: 2015-10-17
  Filled 2015-10-16: qty 4

## 2015-10-16 MED ORDER — ONDANSETRON HCL 4 MG PO TABS: 4.0000 mg | ORAL_TABLET | ORAL | Status: DC | PRN

## 2015-10-16 MED ORDER — DIPHENHYDRAMINE HCL 25 MG PO CAPS: 25.0000 mg | ORAL_CAPSULE | Freq: Four times a day (QID) | ORAL | Status: DC | PRN

## 2015-10-16 MED ORDER — POLYETHYLENE GLYCOL 3350 17 G PO PACK
17.0000 g | PACK | Freq: Every day | ORAL | Status: DC | PRN
  Filled 2015-10-16: qty 1

## 2015-10-16 MED ORDER — ONDANSETRON HCL 4 MG/2ML IJ SOLN: 4.0000 mg | INTRAMUSCULAR | Status: DC | PRN

## 2015-10-16 MED ORDER — OXYCODONE-ACETAMINOPHEN 5-325 MG PO TABS: 2.0000 | ORAL_TABLET | ORAL | Status: DC | PRN

## 2015-10-16 MED ORDER — SENNOSIDES-DOCUSATE SODIUM 8.6-50 MG PO TABS
2.0000 | ORAL_TABLET | ORAL | Status: DC
  Administered 2015-10-17 – 2015-10-18 (×2): 2 via ORAL
  Filled 2015-10-16 (×2): qty 2

## 2015-10-16 MED ORDER — FERROUS SULFATE 325 (65 FE) MG PO TABS
325.0000 mg | ORAL_TABLET | Freq: Two times a day (BID) | ORAL | Status: DC
  Administered 2015-10-16 – 2015-10-18 (×5): 325 mg via ORAL
  Filled 2015-10-16 (×5): qty 1

## 2015-10-16 NOTE — Anesthesia Postprocedure Evaluation (Signed)
Anesthesia Post Note  Patient: Andrea Dean, Andrea Dean  Procedure(s) Performed: * No procedures listed *  Patient location during evaluation: Mother Baby Anesthesia Type: Epidural Level of consciousness: awake and alert Pain management: pain level controlled Vital Signs Assessment: post-procedure vital signs reviewed and stable Respiratory status: spontaneous breathing, nonlabored ventilation and respiratory function stable Cardiovascular status: stable Postop Assessment: no headache, no backache and epidural receding Anesthetic complications: no    Last Vitals:  Filed Vitals:   10/16/15 1145 10/16/15 1234  BP: 122/77 121/67  Pulse: 94 76  Temp:  36.8 C  Resp:  18    Last Pain:  Filed Vitals:   10/16/15 1236  PainSc: 0-No pain                 Melicia Esqueda

## 2015-10-16 NOTE — Progress Notes (Signed)
Patient called out to desk because of a gush of blood. Nurse checked patients fundus, patients uterus boggy and deviated to the left, clots expressed with fundal massage. Nurse called Dr. Charlotta Newtonzan and Dr. Charlotta Newtonzan gave orders to give 1000 mcg of cytotec rectally and methergine IM. Meds given and more clots were massaged out of the uterus. EBL estimated to be 1400 ml. Dr. Charlotta Newtonzan gave orders to put in a indwelling catheter until tomorrow AM and a CBC at 0500 tomorrow. Will continue to monitor patient and administer Methergine PO every four hours. Patients blood pressure is stable at this time and uterus is firm and even with the umbilicus.

## 2015-10-16 NOTE — Lactation Note (Addendum)
This note was copied from a baby's chart. Lactation Consultation Note  Patient Name: Andrea Dean Today's Date: 10/16/2015 Reason for consult: Initial assessment Baby at 9 hr of life and mom reports cluster feeding. She stated that her nipples are getting sore. Her R nipple appears normal, L nipple had a compression stripe and tiny clear blister on the nipple tip. Given comfort gels. Showed mom how to support the breast and guide baby on so that she does not take the tip or half of the nipple in her mouth. Showed FOB how to help mom position baby and pull out the bottom lip. Demonstrated manual expression, colostrum noted bilaterally, spoon in room. Discussed baby behavior, feeding frequency, pumping, baby belly size, voids, wt loss, breast changes, and nipple care. Given lactation handouts. Aware of OP services and support group.     Maternal Data Has patient been taught Hand Expression?: Yes Does the patient have breastfeeding experience prior to this delivery?: Yes  Feeding Feeding Type: Breast Fed Length of feed: 40 min  LATCH Score/Interventions Latch: Grasps breast easily, tongue down, lips flanged, rhythmical sucking.  Audible Swallowing: Spontaneous and intermittent Intervention(s): Skin to skin;Hand expression Intervention(s): Alternate breast massage  Type of Nipple: Everted at rest and after stimulation  Comfort (Breast/Nipple): Filling, red/small blisters or bruises, mild/mod discomfort  Problem noted: Mild/Moderate discomfort;Cracked, bleeding, blisters, bruises Interventions  (Cracked/bleeding/bruising/blister): Expressed breast milk to nipple Interventions (Mild/moderate discomfort): Comfort gels  Hold (Positioning): Assistance needed to correctly position infant at breast and maintain latch. Intervention(s): Support Pillows;Position options  LATCH Score: 8  Lactation Tools Discussed/Used WIC Program: Yes   Consult Status Consult Status: Follow-up Date:  10/17/15 Follow-up type: In-patient    Andrea Dean 10/16/2015, 7:22 PM

## 2015-10-16 NOTE — Anesthesia Preprocedure Evaluation (Signed)
Anesthesia Evaluation  Patient identified by MRN, date of birth, ID band Patient awake    Reviewed: Allergy & Precautions, Patient's Chart, lab work & pertinent test results  Airway Mallampati: II  TM Distance: >3 FB Neck ROM: Full    Dental no notable dental hx. (+) Teeth Intact   Pulmonary shortness of breath and with exertion,    Pulmonary exam normal breath sounds clear to auscultation       Cardiovascular negative cardio ROS Normal cardiovascular exam Rhythm:Regular Rate:Normal     Neuro/Psych negative neurological ROS  negative psych ROS   GI/Hepatic Neg liver ROS, GERD  Medicated and Controlled,  Endo/Other  negative endocrine ROS  Renal/GU negative Renal ROS  negative genitourinary   Musculoskeletal negative musculoskeletal ROS (+)   Abdominal   Peds  Hematology  (+) anemia , Hx/o Thrombocytopenia   Anesthesia Other Findings   Reproductive/Obstetrics (+) Pregnancy Hx/o Previous C/section with successful VBAC HSV                             Anesthesia Physical Anesthesia Plan  ASA: II  Anesthesia Plan: Epidural   Post-op Pain Management:    Induction:   Airway Management Planned:   Additional Equipment:   Intra-op Plan:   Post-operative Plan:   Informed Consent: I have reviewed the patients History and Physical, chart, labs and discussed the procedure including the risks, benefits and alternatives for the proposed anesthesia with the patient or authorized representative who has indicated his/her understanding and acceptance.     Plan Discussed with: Anesthesiologist  Anesthesia Plan Comments:         Anesthesia Quick Evaluation

## 2015-10-16 NOTE — Progress Notes (Signed)
At bedside due to increased bleeding:  Pt was noted to have boggy uterus with passage of large clots.  EBL: 1400cc.  Pt feeling better now with passage of large clots.  Denies dizziness or headache currently.    O: BP 121/67 mmHg  Pulse 76  Temp(Src) 98.3 F (36.8 C) (Oral)  Resp 18  Ht 5\' 3"  (1.6 m)  Wt 73.483 kg (162 lb)  BMI 28.70 kg/m2  SpO2 100%  LMP 01/11/2015  Breastfeeding? Unknown  Gen: alert and oriented CV: regular rate Resp: normal respirations Abd: soft, non-tender, uterus now firm palpable @ umbilicus GU: No large clots appreciated in vault or in lower uterine segment Ext: no calf tenderness bilaterally  A/P: 16XW [id number] s/p VBAC complicated by PPH -PPH due to uterine atony  Total EBL 1400cc, s/p cytotec 1000mcg per rectum   additional Pit/fuids IV (per protocol)  Methergine IM q 4hr x 6 doses as scheduled  Iron twice daily  Since we were unable to establish 2nd line and pt has dramatically improved clinically and bleeding is now minimal plan for CBC in am unless clinically indicated  Foley to remain in place until am -continue routine postpartum care  Myna HidalgoJennifer Zamarah Ullmer, DO [phone] (pager) [phone] (office)

## 2015-10-16 NOTE — Anesthesia Procedure Notes (Signed)
Epidural Patient location during procedure: OB Start time: 10/16/2015 5:50 AM  Staffing Anesthesiologist: Mal AmabileFOSTER, Shizue Kaseman Performed by: anesthesiologist   Preanesthetic Checklist Completed: patient identified, site marked, surgical consent, pre-op evaluation, timeout performed, IV checked, risks and benefits discussed and monitors and equipment checked  Epidural Patient position: sitting Prep: site prepped and draped and DuraPrep Patient monitoring: continuous pulse ox and blood pressure Approach: midline Location: L3-L4 Injection technique: LOR air  Needle:  Needle type: Tuohy  Needle gauge: 17 G Needle length: 9 cm and 9 Needle insertion depth: 5 cm cm Catheter type: closed end flexible Catheter size: 19 Gauge Catheter at skin depth: 10 cm Test dose: negative and Other  Assessment Events: blood not aspirated, injection not painful, no injection resistance, negative IV test and no paresthesia  Additional Notes Patient identified. Risks and benefits discussed including failed block, incomplete  Pain control, post dural puncture headache, nerve damage, paralysis, blood pressure Changes, nausea, vomiting, reactions to medications-both toxic and allergic and post Partum back pain. All questions were answered. Patient expressed understanding and wished to proceed. Sterile technique was used throughout procedure. Epidural site was Dressed with sterile barrier dressing. No paresthesias, signs of intravascular injection Or signs of intrathecal spread were encountered.  Patient was more comfortable after the epidural was dosed. Please see RN's note for documentation of vital signs and FHR which are stable.

## 2015-10-16 NOTE — Progress Notes (Signed)
OB PN:  S: Pt resting comfortably with epidural.  Feeling some pressure with contractions, but much better than before.  O: BP 106/61 mmHg  Pulse 83  Temp(Src) 97.6 F (36.4 C) (Oral)  Resp 16  Ht 5\' 3"  (1.6 m)  Wt 73.483 kg (162 lb)  BMI 28.70 kg/m2  SpO2 100%  LMP 01/11/2015  FHT: 140bpm, moderate variablity, + accels, no decels Toco: q3-184min SVE: deferred, performed per RN @ 0630: 3-4/70/-2  A/P: 30 y.o. [id number] @ 2653w5d for IOL due to non-reassuring fetal well being now transitioning to active labor 1. FWB: Cat. I 2. Labor: continue with Pit per protocol, consider IUPC later today if slow cervical change Pain: continue epidural GBS: negative TOLAC- risk/benefit and consent previously obtained  Myna HidalgoJennifer Rosibel Giacobbe, DO [phone] (pager) [phone] (office)

## 2015-10-17 LAB — CBC
HCT: 26.5 % — ABNORMAL LOW (ref 36.0–46.0)
Hemoglobin: 8.8 g/dL — ABNORMAL LOW (ref 12.0–15.0)
MCH: 31 pg (ref 26.0–34.0)
MCHC: 33.2 g/dL (ref 30.0–36.0)
MCV: 93.3 fL (ref 78.0–100.0)
Platelets: 172 10*3/uL (ref 150–400)
RBC: 2.84 MIL/uL — ABNORMAL LOW (ref 3.87–5.11)
RDW: 15.1 % (ref 11.5–15.5)
WBC: 13.1 10*3/uL — ABNORMAL HIGH (ref 4.0–10.5)

## 2015-10-17 NOTE — Progress Notes (Signed)
Postpartum Note Day # 1  S:  Patient resting comfortable in bed.  Pain controlled.  Tolerating general diet. No flatus, no BM.  Lochia minimal.  Ambulating without difficulty.  She denies n/v/f/c, SOB, or CP.  Pt plans on breastfeeding.  Foley in place with clear urine.  O: Temp:  [97.8 F (36.6 C)-98.7 F (37.1 C)] 98.3 F (36.8 C) (03/29 0624) Pulse Rate:  [71-138] 78 (03/29 0624) Resp:  [16-20] 18 (03/29 0624) BP: (68-126)/(36-77) 116/64 mmHg (03/29 0624) SpO2:  [100 %] 100 % (03/29 0624) Gen: A&Ox3, NAD CV: RRR, no MRG Resp: CTAB Abdomen: soft, NT, ND +BS Uterus: firm, non-tender, below umbilicus Ext: No edema, no calf tenderness bilaterally  Labs: CBC pending  A/P: Pt is a 30 y.o. [id number] s/p VBAC, complicated by PPH, PPD#1 -PPH due to uterine atony, EBL 1400cc  S/p Methergine q 4hr, last dose this am @ 0630  Lochia appropriate  CBC pending, continue iron twice daily  Pt asymptomatic - Pain well controlled -GU: UOP is adequate, plan to remove foley this am -GI: Tolerating general diet -Activity: encouraged sitting up to chair and ambulation as tolerated -Prophylaxis: early ambulation  DISPO: Continue with routine postpartum care  Myna HidalgoJennifer Robey Massmann, DO [phone] (pager) [phone] (office)

## 2015-10-18 MED ORDER — IBUPROFEN 600 MG PO TABS: 600.0000 mg | ORAL_TABLET | Freq: Four times a day (QID) | ORAL | Status: DC

## 2015-10-18 MED ORDER — FERROUS SULFATE 325 (65 FE) MG PO TABS: 325.0000 mg | ORAL_TABLET | Freq: Two times a day (BID) | ORAL | Status: DC

## 2015-10-18 NOTE — Progress Notes (Signed)
Postpartum Note Day # 2  S:  Patient resting comfortable in bed.  Pain controlled.  Tolerating general diet. +  flatus, no BM.  Lochia minimal.  Ambulating without difficulty.  Some fatigue, but no dizziness or heart palpitations.  She denies n/v/f/c, SOB, or CP.  Pt  breastfeeding.  Voiding freely.  O: Temp:  [98.1 F (36.7 C)-98.6 F (37 C)] 98.1 F (36.7 C) (03/30 0537) Pulse Rate:  [67-97] 83 (03/30 0537) Resp:  [17-18] 18 (03/30 0537) BP: (106-115)/(66-68) 106/68 mmHg (03/30 0537) SpO2:  [99 %] 99 % (03/30 0537)   Gen: A&Ox3, NAD CV: RRR, no MRG Resp: CTAB Abdomen: soft, NT, ND +BS Uterus: firm, non-tender, below umbilicus Ext: No edema, no calf tenderness bilaterally  Labs:  CBC Latest Ref Rng 10/17/2015 10/15/2015 02/11/2015  WBC 4.0 - 10.5 K/uL 13.1(H) 10.2 8.3  Hemoglobin 12.0 - 15.0 g/dL 1.6(X8.8(L) 11.4(L) 13.7  Hematocrit 36.0 - 46.0 % 26.5(L) 34.2(L) 40.1  Platelets 150 - 400 K/uL 172 215 243    A/P: Pt is a 30 y.o. [id number] s/p VBAC, complicated by PPH, PPD#2 -PPH due to uterine atony, EBL 1400cc  S/p Methergine q 4hr, last dose this am @ 0630  Lochia appropriate  CBC as above, continue iron twice daily  Pt asymptomatic - Pain well controlled -GU: voiding freely -GI: Tolerating general diet -Activity: encouraged sitting up to chair and ambulation as tolerated -Prophylaxis: early ambulation  DISPO: Meeting postpartum milestones appropriately, plan for discharge home today.  Myna HidalgoJennifer Lonnetta Kniskern, DO [phone] (pager) [phone] (office)

## 2015-10-18 NOTE — Discharge Instructions (Signed)

## 2015-10-18 NOTE — Lactation Note (Signed)
This note was copied from a baby's chart. Lactation Consultation Note Experienced BF mom has been BF baby frequently d/t cluster feeding at this time. RN set up DEBP but baby has been cluster feeding. Mom did get a 2 hour break after this last feeding. Hand expressed breast mom had easy flow of colostrum. Mom latched baby in football hold and baby latched well. Heard swallows. Encouraged mom to occasionally massage breast at intervals. Discussed cluster feeding, STS, I&O, supply and demand. Baby had 7% weight loss at 41 hrs had 6 voids and 8 stools. LC feels that this probably is cause for weight loss. I feel BF is going well. Baby I&O WDL.  Patient Name: Andrea Dean UEAVW'UToday's Date: 10/18/2015 Reason for consult: Follow-up assessment;Infant < 6lbs   Maternal Data    Feeding Feeding Type: Breast Fed Length of feed: 15 min (still BF)  LATCH Score/Interventions Latch: Grasps breast easily, tongue down, lips flanged, rhythmical sucking.  Audible Swallowing: Spontaneous and intermittent  Type of Nipple: Everted at rest and after stimulation  Comfort (Breast/Nipple): Soft / non-tender  Interventions  (Cracked/bleeding/bruising/blister): Expressed breast milk to nipple Interventions (Mild/moderate discomfort): Hand massage;Hand expression  Hold (Positioning): No assistance needed to correctly position infant at breast. Intervention(s): Support Pillows;Position options;Skin to skin  LATCH Score: 10  Lactation Tools Discussed/Used Tools: Pump Breast pump type: Double-Electric Breast Pump Pump Review: Setup, frequency, and cleaning;Milk Storage Initiated by:: RN Date initiated:: 10/17/15   Consult Status Consult Status: Follow-up Date: 10/18/15 Follow-up type: In-patient    Andrea Dean, Andrea Dean 10/18/2015, 4:56 AM

## 2015-10-18 NOTE — Lactation Note (Signed)
This note was copied from a baby's chart. Lactation Consultation Note  Patient Name: Andrea Angelica Pouriel Mccarey WUJWJ'XToday's Date: 10/18/2015 Reason for consult: Follow-up assessment   With this mom and term baby, being discharged to home today. I assisted mom with latching in in football hold, and explained how positioning herself and baby, being comfortable prior to latach, helps with obtaining a deep latch. I showed mom an asymmetrical latch. Mom said this latch was comfortable, and knows to call for questions/concerns.    Maternal Data    Feeding Feeding Type: Breast Fed  LATCH Score/Interventions Latch: Grasps breast easily, tongue down, lips flanged, rhythmical sucking.  Audible Swallowing: A few with stimulation  Type of Nipple: Everted at rest and after stimulation  Comfort (Breast/Nipple): Soft / non-tender  Problem noted: Mild/Moderate discomfort  Hold (Positioning): Assistance needed to correctly position infant at breast and maintain latch. Intervention(s): Breastfeeding basics reviewed;Support Pillows;Position options  LATCH Score: 8  Lactation Tools Discussed/Used     Consult Status Consult Status: Complete Follow-up type: Call as needed    Andrea Dean, Andrea Dean 10/18/2015, 11:12 AM

## 2015-10-18 NOTE — Lactation Note (Signed)
This note was copied from a baby's chart. Lactation Consultation Note  Patient Name: Andrea Dean ZHYQM'VToday's Date: 10/18/2015 Reason for consult: Follow-up assessment   With this mom of a term baby, now 4647 hours old, and samll at 5 lbs 13 oz. Mom reports cluster feeding. Mom has sore nipples, and feels baby is not latching deep enough. I asked mom to call for me when baby next showing cues, so I can observe mom latching.   Maternal Data    Feeding Feeding Type: Breast Fed  LATCH Score/Interventions                      Lactation Tools Discussed/Used     Consult Status Consult Status: Complete Follow-up type: Call as needed    Alfred LevinsLee, Quanell Loughney Anne 10/18/2015, 9:32 AM

## 2015-10-19 ENCOUNTER — Ambulatory Visit: Payer: Self-pay

## 2015-10-19 NOTE — Lactation Note (Signed)
This note was copied from a baby's chart. Lactation Consultation Note  Patient Name: Andrea Dean ZOXWR'UToday's Date: 10/19/2015 Reason for consult: Follow-up assessment   With this mom and term baby, now 972 hours old. Mom is no longer supplementing with formula. Her milk has transitioned in, and baby is satiated after breastfeeding, and mom's breast are softening well after feeding. Mom also has DEP at home, and will supplement with EBM if needed. Mom knows to call for lactation as needed.    Maternal Data    Feeding Feeding Type: Breast Fed Length of feed: 10 min  LATCH Score/Interventions Latch: Grasps breast easily, tongue down, lips flanged, rhythmical sucking.  Audible Swallowing: Spontaneous and intermittent Intervention(s): Skin to skin  Type of Nipple: Everted at rest and after stimulation  Comfort (Breast/Nipple): Soft / non-tender  Problem noted: Mild/Moderate discomfort  Hold (Positioning): No assistance needed to correctly position infant at breast. Intervention(s): Skin to skin  LATCH Score: 10  Lactation Tools Discussed/Used     Consult Status Consult Status: Complete Follow-up type: Call as needed    Alfred LevinsLee, Emillia Weatherly Anne 10/19/2015, 11:13 AM

## 2015-10-23 ENCOUNTER — Inpatient Hospital Stay (HOSPITAL_COMMUNITY): Admission: RE | Admit: 2015-10-23 | Payer: BLUE CROSS/BLUE SHIELD | Source: Ambulatory Visit

## 2015-10-23 NOTE — Discharge Summary (Signed)
OB Discharge Summary     Patient Name: Andrea Dean DOB: Aug 11, 1985 MRN: [id number]  Date of admission: 10/15/2015 Delivering MD: Myna HidalgoZAN, Tabita Corbo   Date of discharge: 10/18/2015  Admitting diagnosis: INDUCTION Intrauterine pregnancy: 2047w5d     Secondary diagnosis:  Active Problems:   NST (non-stress test) nonreactive  Additional problems: Prior history of C-section     Discharge diagnosis: Term Pregnancy Delivered and VBAC                                                                                                Post partum procedures:none  Augmentation: AROM and Pitocin  Complications: Hemorrhage>101000mL  Hospital course:  Induction of Labor With Vaginal Delivery   30 y.o. yo [phone] at 5047w5d was admitted to the hospital 10/15/2015 for induction of labor.  Indication for induction: non-reassuring fetal status.  Patient had an uncomplicated labor course as follows: Membrane Rupture Time/Date: 6:30 AM ,10/16/2015   Intrapartum Procedures: Episiotomy: None [1]                                         Lacerations:  None [1]  Patient had delivery of a Viable infant.  Information for the patient's newborn:  Dorise HissBurch, Harmoni Grace [[id number]][[id number]]  Delivery Method: Vag-Spont   10/16/2015  Details of delivery can be found in separate delivery note.  Postpartum course complicated by postpartum hemorrhage- pt received cytotec, methergine and additional Pitocin.  PPH due to uterine atony, which improved s/p medication.  Hgb listed below, pt did not require blood transfusion and remained asymptomatic. Patient is discharged home 10/18/2015.   Physical exam  Filed Vitals:   10/17/15 0624 10/17/15 0745 10/17/15 1700 10/18/15 0537  BP: 116/64 110/66 115/66 106/68  Pulse: 78 67 97 83  Temp: 98.3 F (36.8 C) 98.6 F (37 C) 98.3 F (36.8 C) 98.1 F (36.7 C)  TempSrc: Oral Oral Oral Oral  Resp: 18 17 17 18   Height:      Weight:      SpO2: 100%   99%   General: alert, cooperative and no  distress Lochia: appropriate Uterine Fundus: firm Incision: N/A DVT Evaluation: No evidence of DVT seen on physical exam. Labs: CBC    Component Value Date/Time   WBC 13.1* 10/17/2015 0624   RBC 2.84* 10/17/2015 0624   HGB 8.8* 10/17/2015 0624   HCT 26.5* 10/17/2015 0624   PLT 172 10/17/2015 0624   MCV 93.3 10/17/2015 0624   MCH 31.0 10/17/2015 0624   MCHC 33.2 10/17/2015 0624   RDW 15.1 10/17/2015 0624   LYMPHSABS 3.8 02/11/2015 2225   MONOABS 0.6 02/11/2015 2225   EOSABS 0.2 02/11/2015 2225   BASOSABS 0.0 02/11/2015 2225     CMP Latest Ref Rng 06/13/2014  Glucose 70 - 99 mg/dL 77  BUN 6 - 23 mg/dL 8  Creatinine 1.300.50 - 8.651.10 mg/dL 7.840.74  Sodium 696135 - 295145 mEq/L 135  Potassium 3.5 - 5.3 mEq/L 4.1  Chloride 96 - 112 mEq/L 99  CO2  19 - 32 mEq/L 27  Calcium 8.4 - 10.5 mg/dL 9.4  Total Protein 6.0 - 8.3 g/dL 7.6  Total Bilirubin 0.2 - 1.2 mg/dL 0.4  Alkaline Phos 39 - 117 U/L 46  AST 0 - 37 U/L 16  ALT 0 - 35 U/L 8    Discharge instruction: per After Visit Summary and "Baby and Me Booklet".  After visit meds:    Medication List    STOP taking these medications        esomeprazole 20 MG capsule  Commonly known as:  NEXIUM     famotidine 20 MG tablet  Commonly known as:  PEPCID      TAKE these medications        acetaminophen 500 MG tablet  Commonly known as:  TYLENOL  Take 500-1,000 mg by mouth daily as needed for moderate pain. Takes 1- 2 tablets as needed     cetirizine 10 MG tablet  Commonly known as:  ZYRTEC  Take 10 mg by mouth daily as needed for allergies.     ferrous sulfate 325 (65 FE) MG tablet  Take 1 tablet (325 mg total) by mouth 2 (two) times daily with a meal.     ibuprofen 600 MG tablet  Commonly known as:  ADVIL,MOTRIN  Take 1 tablet (600 mg total) by mouth every 6 (six) hours.     PNV PRENATAL PLUS MULTIVITAMIN 27-1 MG Tabs  Take 1 tablet by mouth daily before breakfast.     polyethylene glycol packet  Commonly known as:  MIRALAX   Take 17 g by mouth daily.     TUMS PO  Take 1 tablet by mouth daily as needed. Takes up to 2 tablets if needed, depending on severity of reflux     valACYclovir 1000 MG tablet  Commonly known as:  VALTREX  Take 1 tablet (1,000 mg total) by mouth daily.        Diet: routine diet  Activity: Advance as tolerated. Pelvic rest for 6 weeks.   Outpatient follow up:2 weeks Follow up Appt:No future appointments. Follow up Visit:No Follow-up on file.  Postpartum contraception: Progesterone only pills  Newborn Data: Live born female  Birth Weight: 6 lb 4 oz (2835 g) APGAR: 9, 9  Baby Feeding: Breast Disposition:home with mother   10/23/2015 Myna Hidalgo, M, DO

## 2015-10-29 ENCOUNTER — Telehealth (HOSPITAL_COMMUNITY): Payer: Self-pay

## 2015-10-29 NOTE — Telephone Encounter (Signed)
Experience BF mother of 2 older children. Hx PPH with this baby. Milk came in at 4 days. Iron was low. Was not pumping as much as she "should have" related to not feeling well.Baby is not latching well and breasts are not draining. Breasts were staying full and now milk supply has decreased. Baby feeds on the breast about 10-15 minutes. Mom gives her unlimited access. Encouraged her to continue feeding on cue and pump 4 times in the next 24 hours to help with her supply, attend support group tomorrow and OP appointment on Friday.

## 2015-11-02 ENCOUNTER — Ambulatory Visit (HOSPITAL_COMMUNITY)
Admission: RE | Admit: 2015-11-02 | Discharge: 2015-11-02 | Disposition: A | Discharge status: Home / Self Care | Payer: BLUE CROSS/BLUE SHIELD | Source: Ambulatory Visit | Attending: Family Medicine | Admitting: Family Medicine

## 2015-11-02 NOTE — Lactation Note (Signed)
Lactation Consult: weight today 7 lbs - 8.7 oz, 3422 g. Mom had pp hemorrhage after delivery. Has been taking Fenugreek since Tuesday. Reports she noticed an increase in milk supply on Wednesday. Concerned that baby wants to feed every 1- 1/12 hours. Is only nursing on one breast. Suggested offering both breasts at every feeding  Experienced BF mom. Encouragement given. Plans to continue coming to support group. No further questions at present. To call prn. Will make another OP appointment if needed.  Mother's reason for visit:  Make sure I am doing this right Visit Type:  Feeding assessment Appointment Notes:  Mom had PP hemorrhage  Consult:  Initial Lactation Consultant:  Audry RilesWeeks, Marvine Encalade D  ________________________________________________________________________  Joan FloresBaby's Name: Andrea HissHarmoni Grace Dean Date of Birth: 10/16/2015 Pediatrician: Jolaine Clickhomas, Carmen Gender: female Gestational Age: 6765w5d (At Birth) Birth Weight: 6 lb 4 oz (2835 g) Weight at Discharge: Weight: 5 lb 12.6 oz (2625 g) (2)Date of Discharge: 10/19/2015 Filed Weights   10/16/15 2259 10/18/15 0026 10/19/15 0000  Weight: 6 lb 2.4 oz (2790 g) 5 lb 13.1 oz (2640 g) 5 lb 12.6 oz (2625 g)       ________________________________________________________________________  Mother's Name: Andrea PouAriel Dean Breastfeeding Experience:  P3 Experienced BF mom nursed 1st baby 6 months and 2nd baby for 14 months  ________________________________________________________________________  Breastfeeding History (Post Discharge)  Frequency of breastfeeding:  Q 1- 1 1/2 hours Duration of feeding:  10-15 min    Pumping  Type of pump:  Medela pump in style Frequency:  2-3 times/day Volume:  3-5 oz  Infant Intake and Output Assessment  Voids:  7+ in 24 hrs.  Color:  Clear yellow Stools:  7+ in 24 hrs.  Color:  Yellow and seedy  ________________________________________________________________________  Maternal Breast  Assessment  Breast:  Filling Nipple:  Erect     _______________________________________________________________________ Feeding Assessment/Evaluation  Initial feeding assessment:  Infant's oral assessment:  WNL  Positioning:  Cradle Right breast  LATCH documentation:  Latch:  2 = Grasps breast easily, tongue down, lips flanged, rhythmical sucking.  Audible swallowing:  2 = Spontaneous and intermittent  Type of nipple:  2 = Everted at rest and after stimulation  Comfort (Breast/Nipple):  2 = Soft / non-tender  Hold (Positioning):  1 = Assistance needed to correctly position infant at breast and maintain latch  LATCH score:  9  Attached assessment:  Deep  Lips flanged:  Yes.    Lips untucked:  Yes.    Suck assessment:  Nutritive   Pre-feed weight:   3422 g 7- 8.7 oz Post-feed weight:  3486 g  7-11.0 oz Amount transferred:  64 ml  Andrea latched well on right breast in cradle position after I untucked bottom lip. Mom reports it was pinching but then felt better. Lots of swallows noted  Pre-feed weight:  3486 g  7- 11.0 oz Post-feed weight:  3502 g 7- 11.6 oz Amount transferred:  16 ml  Andrea latched well but only nursed for 5 min. Again I untucked bottom lip.  Content after nursing. Spit very small amount after nursing.   Total amount pumped post feed:  Mom did not bring pump with her   Total amount transferred:  80 ml

## 2015-12-04 ENCOUNTER — Ambulatory Visit (HOSPITAL_COMMUNITY)
Admission: RE | Admit: 2015-12-04 | Discharge: 2015-12-04 | Disposition: A | Discharge status: Home / Self Care | Payer: BLUE CROSS/BLUE SHIELD | Source: Ambulatory Visit | Attending: Obstetrics & Gynecology | Admitting: Obstetrics & Gynecology

## 2015-12-04 NOTE — Lactation Note (Signed)
Lactation Consult  Mother's reason for visit:  Check latch and milk supply Visit Type:  OP Appointment Notes:  Andrea Dean is gaining weight well but mom wants to check Andrea Dean's latch and her milk supply. She noticed a couple of weeks ago that Andrea Dean started clicking and that her stool had changed from yellow to green which is a sign of foremilk-hind milk imbalance related to the breast not being well drained.  The stool has resolved for the most part. Oral evaluation reveals that her gums blanch when her upper lip is flanged and it does not even begin to cover her nostrils. An anterior bubble palate is present and Andrea Dean does not maintain a seal.  A posterior frenum is noted with digital exam.  When Andrea Dean cries her tongue does not elevate. She pushes her tongue against the upper alveolar ridge or tongue thrusts when her mouth is slightly open .  She initially did not lateralize well to the left but with continued gum massage she eventually did.  Recommended mom do oral exercises at home with her.  Mom also mentioned that Andrea Dean chokes with let down. Today mom was shown how to latch with an off-center latch.  Andrea Dean attached easily and mom reported no pain whereas previously she was experiencing pain when BF.  Baby did not have strong, rhythmic jaw movements when she was feeding though there was a transfer of  3 oz from the first breast and 0.8 from the second.  Suspect it is because mom's body is working well.  Quivering of the lower jaw was also noted which is a sign of muscle fatigue. Plan is to work on latch, perform sucking exercises and tummy time to help with neck ROM. Referred to [email or web address] and [email or web address].com  to learn more about how oral restrictions can affect BF.  Plan: Tongue exercises Tummy time to encourage  Work on latch Monitor supply    Consult:  Follow-Up Lactation Consultant:  Soyla DryerJoseph,  Sintia Mckissic  ________________________________________________________________________ Andrea FloresBaby's Name: Andrea HissHarmoni Grace Dean Date of Birth: 10/16/2015 Pediatrician: Maisie Fushomas Gender: female Gestational Age: 7677w5d (At Birth) Birth Weight: 6 lb 4 oz (2835 g) Weight at Discharge: Weight: 5 lb 12.6 oz (2625 g) (2)Date of Discharge: 10/19/2015 Filed Weights   10/16/15 2259 10/18/15 0026 10/19/15 0000  Weight: 6 lb 2.4 oz (2790 g) 5 lb 13.1 oz (2640 g) 5 lb 12.6 oz (2625 g)   Last weight taken from location outside of [facility]: 9#6 oz Location:[facility]ospital support group Weight today: 10# 3.3oz      ________________________________________________________________________  Mother's Name: Andrea PouAriel Dean Type of delivery:   Breastfeeding Experience:  Two older children for 10 mos and 14 mos Maternal Medical Conditions:  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  Color:  Clear yellow Stools:  5+ in 24 hrs.  Color:  Green and Yellow  ________________________________________________________________________

## 2015-12-06 ENCOUNTER — Emergency Department (HOSPITAL_COMMUNITY)
Admission: EM | Admit: 2015-12-06 | Discharge: 2015-12-06 | Disposition: A | Discharge status: Home / Self Care | Payer: BLUE CROSS/BLUE SHIELD | Attending: Emergency Medicine | Admitting: Emergency Medicine

## 2015-12-06 ENCOUNTER — Encounter (HOSPITAL_COMMUNITY): Payer: Self-pay

## 2015-12-06 DIAGNOSIS — Z8719 Personal history of other diseases of the digestive system: Secondary | ICD-10-CM | POA: Diagnosis not present

## 2015-12-06 DIAGNOSIS — Z79899 Other long term (current) drug therapy: Secondary | ICD-10-CM | POA: Diagnosis not present

## 2015-12-06 DIAGNOSIS — Z8614 Personal history of Methicillin resistant Staphylococcus aureus infection: Secondary | ICD-10-CM | POA: Diagnosis not present

## 2015-12-06 DIAGNOSIS — Z791 Long term (current) use of non-steroidal anti-inflammatories (NSAID): Secondary | ICD-10-CM | POA: Diagnosis not present

## 2015-12-06 DIAGNOSIS — Y9289 Other specified places as the place of occurrence of the external cause: Secondary | ICD-10-CM | POA: Insufficient documentation

## 2015-12-06 DIAGNOSIS — Z91040 Latex allergy status: Secondary | ICD-10-CM | POA: Insufficient documentation

## 2015-12-06 DIAGNOSIS — Y998 Other external cause status: Secondary | ICD-10-CM | POA: Diagnosis not present

## 2015-12-06 DIAGNOSIS — Z8742 Personal history of other diseases of the female genital tract: Secondary | ICD-10-CM | POA: Insufficient documentation

## 2015-12-06 DIAGNOSIS — S6991XA Unspecified injury of right wrist, hand and finger(s), initial encounter: Secondary | ICD-10-CM

## 2015-12-06 DIAGNOSIS — S60444A External constriction of right ring finger, initial encounter: Secondary | ICD-10-CM | POA: Diagnosis not present

## 2015-12-06 DIAGNOSIS — Z862 Personal history of diseases of the blood and blood-forming organs and certain disorders involving the immune mechanism: Secondary | ICD-10-CM | POA: Diagnosis not present

## 2015-12-06 DIAGNOSIS — Z8781 Personal history of (healed) traumatic fracture: Secondary | ICD-10-CM | POA: Diagnosis not present

## 2015-12-06 DIAGNOSIS — Z8751 Personal history of pre-term labor: Secondary | ICD-10-CM | POA: Diagnosis not present

## 2015-12-06 DIAGNOSIS — Y9389 Activity, other specified: Secondary | ICD-10-CM | POA: Insufficient documentation

## 2015-12-06 DIAGNOSIS — Z87440 Personal history of urinary (tract) infections: Secondary | ICD-10-CM | POA: Diagnosis not present

## 2015-12-06 DIAGNOSIS — W4904XA Ring or other jewelry causing external constriction, initial encounter: Secondary | ICD-10-CM | POA: Diagnosis not present

## 2015-12-06 DIAGNOSIS — Z8619 Personal history of other infectious and parasitic diseases: Secondary | ICD-10-CM | POA: Insufficient documentation

## 2015-12-06 NOTE — ED Notes (Signed)
Rings removed using ring cutter .

## 2015-12-06 NOTE — ED Notes (Addendum)
Pt had a baby yesterday and replaced wedding band today, hand began to swell, unable to remove at home.

## 2015-12-06 NOTE — ED Provider Notes (Signed)
CSN: [id number]     Arrival date & time 12/06/15  1191 History   First MD Initiated Contact with Patient 12/06/15 0405     Chief Complaint  Patient presents with  . ring removal      (Consider location/radiation/quality/duration/timing/severity/associated sxs/prior Treatment) HPI   Patient to the ER with complaints of ring stuck on finger. She recently had a baby, 7 weeks ago, and put on her wedding ring approx 14 hours ago for the first time. It went on easily but she then realized she was unable to get it off her finger this evening. Her finger started to swell and hurt. She called EMS to the house and they were unable to remove it. Pt drove herself to the ED. Ring was removed in triage prior to provider arrival. Denies continued pain. Pt still has swelling. Denies numbness tingling or being unable to move her finger.  Past Medical History  Diagnosis Date  . Abnormal Pap smear     repeat WNL  . Chlamydia   . Preterm labor   . Urinary tract infection   . MRSA (methicillin resistant staph aureus) culture positive   . Fracture closed, fibula, shaft 2007    left, no surgical repair  . Ovarian cyst   . Herpes genitalis in women   . H/O seasonal allergies   . GERD (gastroesophageal reflux disease)   . Thrombocytopenia (HCC)     2006 DURING PREGNANCY - NO PROBLEMS WITH DELIVERY OR ANY KNOWN PROBLEMS SINCE  . Urticaria    Past Surgical History  Procedure Laterality Date  . Cesarean section    . Wisdom tooth extraction  2004  . I & d of piloniadal cyst in office - local - felt jittery afterwards    . Pilonidal cyst excision N/A 05/05/2014    Procedure:  EXCISION PILONIDAL CYST;  Surgeon: Romie Levee, MD;  Location: [facility];  Service: General;  Laterality: N/A;   Family History  Problem Relation Age of Onset  . Hypertension Maternal Grandmother   . Stroke Maternal Grandmother   . Cancer Paternal Grandmother     brain, lung  . Diabetes Paternal Grandmother   . Heart disease  Paternal Grandmother   . Hypertension Paternal Grandmother   . Anesthesia problems Neg Hx   . Hypotension Neg Hx   . Malignant hyperthermia Neg Hx   . Pseudochol deficiency Neg Hx   . Allergic rhinitis Neg Hx   . Asthma Neg Hx   . Angioedema Neg Hx   . Eczema Neg Hx   . Immunodeficiency Neg Hx   . Urticaria Neg Hx    Social History  Substance Use Topics  . Smoking status: Never Smoker   . Smokeless tobacco: Never Used  . Alcohol Use: No     Comment: occ   OB History    Gravida Para Term Preterm AB TAB SAB Ectopic Multiple Living   0 3     Review of Systems  Review of Systems All other systems negative except as documented in the HPI. All pertinent positives and negatives as reviewed in the HPI.   Allergies  Flagyl and Latex  Home Medications   Prior to Admission medications   Medication Sig Start Date End Date Taking? Authorizing Provider  acetaminophen (TYLENOL) 500 MG tablet Take 500-1,000 mg by mouth daily as needed for moderate pain. Takes 1- 2 tablets as needed    Historical Provider, MD  Calcium Carbonate Antacid (  TUMS PO) Take 1 tablet by mouth daily as needed. Takes up to 2 tablets if needed, depending on severity of reflux    Historical Provider, MD  cetirizine (ZYRTEC) 10 MG tablet Take 10 mg by mouth daily as needed for allergies.    Historical Provider, MD  ferrous sulfate 325 (65 FE) MG tablet Take 1 tablet (325 mg total) by mouth 2 (two) times daily with a meal. 10/18/15   Myna HidalgoJennifer Ozan, DO  ibuprofen (ADVIL,MOTRIN) 600 MG tablet Take 1 tablet (600 mg total) by mouth every 6 (six) hours. 10/18/15   Myna HidalgoJennifer Ozan, DO  polyethylene glycol [facility](MIRALAX) packet Take 17 g by mouth daily. Patient not taking: Reported on 10/15/2015 09/05/15   Aviva SignsMarie L Williams, CNM  Prenatal Vit-Fe Fumarate-FA (PNV PRENATAL PLUS MULTIVITAMIN) 27-1 MG TABS Take 1 tablet by mouth daily before breakfast. 06/13/14   Brock Badharles A Harper, MD  valACYclovir (VALTREX) 1000 MG tablet Take  1 tablet (1,000 mg total) by mouth daily. 05/14/15   Brock Badharles A Harper, MD   BP 116/82 mmHg  Pulse 77  Temp(Src) 97.8 F (36.6 C)  Resp 16  Ht 5\' 3"  (1.6 m)  Wt 64.411 kg  BMI 25.16 kg/m2  SpO2 99% Physical Exam  Constitutional: She appears well-developed and well-nourished. No distress.  HENT:  Head: Normocephalic and atraumatic.  Eyes: Pupils are equal, round, and reactive to light.  Neck: Normal range of motion. Neck supple.  Cardiovascular: Normal rate and regular rhythm.   Pulmonary/Chest: Effort normal.  Abdominal: Soft.  Musculoskeletal:  Swelling noted to right ring finger, patient has full range of motion, intact sensation to touch, physiologic capillary refill. She has no disruption to the skin.  Neurological: She is alert.  Skin: Skin is warm and dry.  Nursing note and vitals reviewed.   ED Course  Procedures (including critical care time) Labs Review Labs Reviewed - No data to display  Imaging Review No results found. I have personally reviewed and evaluated these images and lab results as part of my medical decision-making.   EKG Interpretation None      MDM   Final diagnoses:  Finger injury, right, initial encounter    Ring removed in triage.  No complications noted Discussed return precautions and recommend ice to help with swelling and Ibuprofen for pain.  Medications - No data to display  I discussed results, diagnoses and plan with Andrea PouAriel Hollywood. They voice there understanding and questions were answered. We discussed follow-up recommendations and return precautions.     Andrea Peliffany Graclyn Lawther, PA-C 12/06/15 [id number]  Layla MawKristen N Ward, DO 12/06/15 [phone]

## 2016-01-03 NOTE — Telephone Encounter (Signed)
Opened in error

## 2016-11-20 ENCOUNTER — Other Ambulatory Visit: Payer: Self-pay | Admitting: Obstetrics & Gynecology

## 2016-11-20 ENCOUNTER — Other Ambulatory Visit (HOSPITAL_COMMUNITY)
Admission: RE | Admit: 2016-11-20 | Discharge: 2016-11-20 | Disposition: A | Discharge status: Home / Self Care | Payer: 59 | Source: Ambulatory Visit | Attending: Obstetrics & Gynecology | Admitting: Obstetrics & Gynecology

## 2016-11-20 DIAGNOSIS — Z1151 Encounter for screening for human papillomavirus (HPV): Secondary | ICD-10-CM | POA: Insufficient documentation

## 2016-11-20 DIAGNOSIS — Z01419 Encounter for gynecological examination (general) (routine) without abnormal findings: Secondary | ICD-10-CM | POA: Diagnosis not present

## 2016-11-20 DIAGNOSIS — Z131 Encounter for screening for diabetes mellitus: Secondary | ICD-10-CM | POA: Diagnosis not present

## 2016-11-20 DIAGNOSIS — Z136 Encounter for screening for cardiovascular disorders: Secondary | ICD-10-CM | POA: Diagnosis not present

## 2016-11-24 LAB — CYTOLOGY - PAP
Diagnosis: NEGATIVE
HPV: NOT DETECTED

## 2017-01-20 MED FILL — valACYclovir HCL 1 GM TABS: 1 | 30 days supply | Qty: 30 | Fill #0

## 2017-03-04 MED FILL — valACYclovir HCL 1 GM TABS: 1 | 30 days supply | Qty: 30 | Fill #1

## 2017-04-17 MED FILL — valACYclovir HCL 1 GM TABS: 1 | 30 days supply | Qty: 30 | Fill #2

## 2017-05-27 MED FILL — valACYclovir HCL 1 GM TABS: 1 | 60 days supply | Qty: 60 | Fill #3

## 2017-06-19 ENCOUNTER — Other Ambulatory Visit: Payer: Self-pay | Admitting: Obstetrics

## 2017-06-19 DIAGNOSIS — H1032 Unspecified acute conjunctivitis, left eye: Secondary | ICD-10-CM

## 2017-06-19 MED ORDER — AZITHROMYCIN 1 % OP SOLN: 1.0000 [drp] | Freq: Two times a day (BID) | OPHTHALMIC | 1 refills | Status: DC

## 2017-06-19 MED ORDER — POLYMYXIN B-TRIMETHOPRIM 10000-0.1 UNIT/ML-% OP SOLN: 1.0000 [drp] | OPHTHALMIC | 1 refills | Status: DC

## 2017-06-19 MED FILL — POLYMYXIN B/TMP EYE DROPS: 10000-0.1 | 17 days supply | Qty: 10 | Fill #0

## 2017-06-30 ENCOUNTER — Other Ambulatory Visit: Payer: Self-pay | Admitting: Obstetrics

## 2017-06-30 DIAGNOSIS — H1033 Unspecified acute conjunctivitis, bilateral: Secondary | ICD-10-CM

## 2017-06-30 DIAGNOSIS — H10013 Acute follicular conjunctivitis, bilateral: Secondary | ICD-10-CM | POA: Diagnosis not present

## 2017-06-30 MED ORDER — MOXIFLOXACIN HCL 0.5 % OP SOLN: 1.0000 [drp] | Freq: Three times a day (TID) | OPHTHALMIC | 1 refills | Status: DC

## 2017-06-30 MED ORDER — OLOPATADINE HCL 0.2 % OP SOLN: 2.0000 [drp] | Freq: Every day | OPHTHALMIC | 1 refills | Status: AC

## 2017-06-30 MED ORDER — OLOPATADINE HCL 0.2 % OP SOLN: 2.0000 [drp] | Freq: Every day | OPHTHALMIC | 1 refills | Status: DC

## 2017-06-30 MED FILL — OLOPATADINE HCL 0.2 % SOLN: 0.2 | 30 days supply | Qty: 3 | Fill #0

## 2017-06-30 MED FILL — LOTEMAX 0.5% GEL: 0.5 | 13 days supply | Qty: 5 | Fill #0

## 2017-07-07 DIAGNOSIS — H10013 Acute follicular conjunctivitis, bilateral: Secondary | ICD-10-CM | POA: Diagnosis not present

## 2017-08-05 MED FILL — valACYclovir HCL 1 GM TABS: 1 | 30 days supply | Qty: 30 | Fill #0

## 2017-09-14 MED FILL — valACYclovir HCL 1 GM TABS: 1 | 90 days supply | Qty: 90 | Fill #1

## 2017-10-09 ENCOUNTER — Encounter: Payer: Self-pay | Admitting: Emergency Medicine

## 2017-10-09 ENCOUNTER — Ambulatory Visit (INDEPENDENT_AMBULATORY_CARE_PROVIDER_SITE_OTHER): Payer: Self-pay | Admitting: Emergency Medicine

## 2017-10-09 VITALS — BP 118/72 | HR 70 | Temp 98.4°F | Resp 16 | Ht 63.0 in | Wt 129.0 lb

## 2017-10-09 DIAGNOSIS — Z021 Encounter for pre-employment examination: Secondary | ICD-10-CM

## 2017-10-09 NOTE — Progress Notes (Signed)
Subjective:  Andrea Dean is a 32 y.o. female who presents for basic physical exam. Patient denies any current health related concerns.    There is no immunization history on file for this patient.  Past Medical History:  Diagnosis Date  . Abnormal Pap smear    repeat WNL  . Chlamydia   . Fracture closed, fibula, shaft 2007   left, no surgical repair  . GERD (gastroesophageal reflux disease)   . H/O seasonal allergies   . Herpes genitalis in women   . MRSA (methicillin resistant staph aureus) culture positive   . Ovarian cyst   . Preterm labor   . Thrombocytopenia (HCC)    2006 DURING PREGNANCY - NO PROBLEMS WITH DELIVERY OR ANY KNOWN PROBLEMS SINCE  . Urinary tract infection   . Urticaria     Past Surgical History:  Procedure Laterality Date  . CESAREAN SECTION    . I & D OF PILONIADAL CYST IN OFFICE - LOCAL - FELT JITTERY AFTERWARDS    . PILONIDAL CYST EXCISION N/A 05/05/2014   Procedure:  EXCISION PILONIDAL CYST;  Surgeon: Romie Levee, MD;  Location: [facility];  Service: General;  Laterality: N/A;  . WISDOM TOOTH EXTRACTION  2004    Social History   Tobacco Use  . Smoking status: Never Smoker  . Smokeless tobacco: Never Used  Substance Use Topics  . Alcohol use: No    Alcohol/week: 0.0 oz    Comment: occ  . Drug use: No    Allergies  Allergen Reactions  . Flagyl [Metronidazole Hcl] Hives and Nausea And Vomiting  . Latex     sensitive    Current Outpatient Medications  Medication Sig Dispense Refill  . acetaminophen (TYLENOL) 500 MG tablet Take 500-1,000 mg by mouth daily as needed for moderate pain. Takes 1- 2 tablets as needed    . cetirizine (ZYRTEC) 10 MG tablet Take 10 mg by mouth daily as needed for allergies.    Marland Kitchen ibuprofen (ADVIL,MOTRIN) 600 MG tablet Take 1 tablet (600 mg total) by mouth every 6 (six) hours. 30 tablet 0  . valACYclovir (VALTREX) 1000 MG tablet Take 1 tablet (1,000 mg total) by mouth daily. 30 tablet 11  . Calcium Carbonate Antacid  (TUMS PO) Take 1 tablet by mouth daily as needed. Takes up to 2 tablets if needed, depending on severity of reflux     No current facility-administered medications for this visit.     Review of Systems  Constitutional: Negative.   HENT: Negative.   Respiratory: Negative.   Cardiovascular: Negative.   Gastrointestinal: Negative.   Genitourinary: Negative.   Musculoskeletal: Negative.   Skin: Negative.   Neurological: Negative.     BP 118/72 (BP Location: Right Arm, Patient Position: Sitting, Cuff Size: Normal)   Pulse 70   Temp 98.4 F (36.9 C) (Oral)   Resp 16   Ht 5\' 3"  (1.6 m)   Wt 129 lb (58.5 kg)   SpO2 98%   BMI 22.85 kg/m    Objective:  BP 118/72 (BP Location: Right Arm, Patient Position: Sitting, Cuff Size: Normal)   Pulse 70   Temp 98.4 F (36.9 C) (Oral)   Resp 16   Ht 5\' 3"  (1.6 m)   Wt 129 lb (58.5 kg)   SpO2 98%   BMI 22.85 kg/m  General appearance: alert, cooperative and appears stated age Head: Normocephalic, without obvious abnormality, atraumatic Eyes: conjunctivae/corneas clear. PERRL, EOM's intact. Fundi benign. Ears: normal TM's and external ear canals both  ears Nose: Nares normal. Septum midline. Mucosa normal. No drainage or sinus tenderness. Throat: lips, mucosa, and tongue normal; teeth and gums normal Neck: no adenopathy Back: symmetric, no curvature. ROM normal. No CVA tenderness. Lungs: clear to auscultation bilaterally Heart: regular rate and rhythm Abdomen: soft, non-tender; bowel sounds normal; no masses,  no organomegaly Extremities: extremities normal, atraumatic, no cyanosis or edema Pulses: 2+ and symmetric Skin: Skin color, texture, turgor normal. No rashes or lesions     Assessment:  basic physical exam    Plan:  Patient education provided.  Ordered the following labs:  Quantiferon Gold. Cleared for Nursing school

## 2017-10-12 ENCOUNTER — Telehealth: Payer: Self-pay

## 2017-10-12 NOTE — Telephone Encounter (Signed)
Patient states shes good.

## 2017-12-24 ENCOUNTER — Other Ambulatory Visit: Payer: Self-pay | Admitting: Obstetrics

## 2017-12-29 ENCOUNTER — Other Ambulatory Visit: Payer: Self-pay | Admitting: Obstetrics

## 2017-12-29 DIAGNOSIS — O219 Vomiting of pregnancy, unspecified: Secondary | ICD-10-CM

## 2017-12-29 MED ORDER — VITAMIN B-6 100 MG PO TABS: 100.0000 mg | ORAL_TABLET | Freq: Every day | ORAL | 5 refills | Status: DC

## 2017-12-29 MED ORDER — DOXYLAMINE-PYRIDOXINE 10-10 MG PO TBEC: DELAYED_RELEASE_TABLET | ORAL | 5 refills | Status: DC

## 2017-12-29 MED ORDER — DOXYLAMINE SUCCINATE (SLEEP) 25 MG PO TABS: 25.0000 mg | ORAL_TABLET | Freq: Every evening | ORAL | 0 refills | Status: DC | PRN

## 2017-12-29 MED FILL — valACYclovir HCL 1 GM TABS: 1 | 30 days supply | Qty: 30 | Fill #2

## 2017-12-30 LAB — OB RESULTS CONSOLE GC/CHLAMYDIA
Chlamydia: NEGATIVE
Gonorrhea: NEGATIVE

## 2018-01-05 LAB — OB RESULTS CONSOLE RUBELLA ANTIBODY, IGM: Rubella: IMMUNE

## 2018-01-05 LAB — OB RESULTS CONSOLE HIV ANTIBODY (ROUTINE TESTING): HIV: NONREACTIVE

## 2018-01-05 LAB — OB RESULTS CONSOLE HEPATITIS B SURFACE ANTIGEN: Hepatitis B Surface Ag: NEGATIVE

## 2018-01-05 LAB — OB RESULTS CONSOLE RPR: RPR: NONREACTIVE

## 2018-01-28 MED FILL — valACYclovir HCL 1 GM TABS: 1 | 90 days supply | Qty: 90 | Fill #0

## 2018-04-18 ENCOUNTER — Encounter (HOSPITAL_COMMUNITY): Payer: Self-pay | Admitting: *Deleted

## 2018-04-18 ENCOUNTER — Inpatient Hospital Stay (HOSPITAL_COMMUNITY)
Admission: AD | Admit: 2018-04-18 | Discharge: 2018-04-18 | Disposition: A | Discharge status: Home / Self Care | Payer: No Typology Code available for payment source | Source: Ambulatory Visit | Attending: Obstetrics & Gynecology | Admitting: Obstetrics & Gynecology

## 2018-04-18 DIAGNOSIS — R002 Palpitations: Secondary | ICD-10-CM | POA: Diagnosis not present

## 2018-04-18 DIAGNOSIS — Z3A22 22 weeks gestation of pregnancy: Secondary | ICD-10-CM | POA: Diagnosis not present

## 2018-04-18 DIAGNOSIS — R55 Syncope and collapse: Secondary | ICD-10-CM | POA: Diagnosis not present

## 2018-04-18 DIAGNOSIS — O26892 Other specified pregnancy related conditions, second trimester: Secondary | ICD-10-CM | POA: Diagnosis not present

## 2018-04-18 LAB — COMPREHENSIVE METABOLIC PANEL
ALT: 11 U/L (ref 0–44)
AST: 16 U/L (ref 15–41)
Albumin: 3.1 g/dL — ABNORMAL LOW (ref 3.5–5.0)
Alkaline Phosphatase: 39 U/L (ref 38–126)
Anion gap: 6 (ref 5–15)
BUN: 7 mg/dL (ref 6–20)
CO2: 25 mmol/L (ref 22–32)
Calcium: 8.9 mg/dL (ref 8.9–10.3)
Chloride: 105 mmol/L (ref 98–111)
Creatinine, Ser: 0.61 mg/dL (ref 0.44–1.00)
GFR calc Af Amer: >60 mL/min (ref >60)
GFR calc non Af Amer: >60 mL/min (ref >60)
Glucose, Bld: 84 mg/dL (ref 70–99)
Potassium: 3.6 mmol/L (ref 3.5–5.1)
Sodium: 136 mmol/L (ref 135–145)
Total Bilirubin: 0.8 mg/dL (ref 0.3–1.2)
Total Protein: 7.3 g/dL (ref 6.5–8.1)

## 2018-04-18 LAB — URINALYSIS, ROUTINE W REFLEX MICROSCOPIC
Bilirubin Urine: NEGATIVE
Glucose, UA: NEGATIVE mg/dL
Hgb urine dipstick: NEGATIVE
Ketones, ur: NEGATIVE mg/dL
Leukocytes, UA: NEGATIVE
Nitrite: NEGATIVE
Protein, ur: NEGATIVE mg/dL
Specific Gravity, Urine: 1.012 (ref 1.005–1.030)
pH: 7 (ref 5.0–8.0)

## 2018-04-18 LAB — TSH: TSH: 0.828 u[IU]/mL (ref 0.350–4.500)

## 2018-04-18 LAB — CBC
HCT: 34.7 % — ABNORMAL LOW (ref 36.0–46.0)
Hemoglobin: 11.8 g/dL — ABNORMAL LOW (ref 12.0–15.0)
MCH: 33.8 pg (ref 26.0–34.0)
MCHC: 34 g/dL (ref 30.0–36.0)
MCV: 99.4 fL (ref 78.0–100.0)
Platelets: 268 10*3/uL (ref 150–400)
RBC: 3.49 MIL/uL — ABNORMAL LOW (ref 3.87–5.11)
RDW: 12.3 % (ref 11.5–15.5)
WBC: 10.7 10*3/uL — ABNORMAL HIGH (ref 4.0–10.5)

## 2018-04-18 LAB — MAGNESIUM: Magnesium: 2 mg/dL (ref 1.7–2.4)

## 2018-04-18 LAB — GLUCOSE, CAPILLARY
Glucose-Capillary: 69 mg/dL — ABNORMAL LOW (ref 70–99)
Glucose-Capillary: 82 mg/dL (ref 70–99)

## 2018-04-18 NOTE — Discharge Instructions (Signed)
Palpitations A palpitation is the feeling that your heartbeat is irregular or is faster than normal. It may feel like your heart is fluttering or skipping a beat. Palpitations are usually not a serious problem. They may be caused by many things, including smoking, caffeine, alcohol, stress, and certain medicines. Although most causes of palpitations are not serious, palpitations can be a sign of a serious medical problem. In some cases, you may need further medical evaluation. Follow these instructions at home: Pay attention to any changes in your symptoms. Take these actions to help with your condition:  Avoid the following: ? Caffeinated coffee, tea, soft drinks, diet pills, and energy drinks. ? Chocolate. ? Alcohol.  Do not use any tobacco products, such as cigarettes, chewing tobacco, and e-cigarettes. If you need help quitting, ask your health care provider.  Try to reduce your stress and anxiety. Things that can help you relax include: ? Yoga. ? Meditation. ? Physical activity, such as swimming, jogging, or walking. ? Biofeedback. This is a method that helps you learn to use your mind to control things in your body, such as your heartbeats.  Get plenty of rest and sleep.  Take over-the-counter and prescription medicines only as told by your health care provider.  Keep all follow-up visits as told by your health care provider. This is important.  Contact a health care provider if:  You continue to have a fast or irregular heartbeat after 24 hours.  Your palpitations occur more often. Get help right away if:  You have chest pain or shortness of breath.  You have a severe headache.  You feel dizzy or you faint. This information is not intended to replace advice given to you by your health care provider. Make sure you discuss any questions you have with your health care provider. Document Released: 07/04/2000 Document Revised: 12/10/2015 Document Reviewed: 03/22/2015 Elsevier  Interactive Patient Education  2018 [employer]   Holter Monitoring A Holter monitor is a small device that is used to detect abnormal heart rhythms. It clips to your clothing and is connected by wires to flat, sticky disks (electrodes) that attach to your chest. It is worn continuously for 24-48 hours. Follow these instructions at home:  Wear your Holter monitor at all times, even while exercising and sleeping, for as long as directed by your health care provider.  Make sure that the Holter monitor is safely clipped to your clothing or close to your body as recommended by your health care provider.  Do not get the monitor or wires wet.  Do not put body lotion or moisturizer on your chest.  Keep your skin clean.  Keep a diary of your daily activities, such as walking and doing chores. If you feel that your heartbeat is abnormal or that your heart is fluttering or skipping a beat: ? Record what you are doing when it happens. ? Record what time of day the symptoms occur.  Return your Holter monitor as directed by your health care provider.  Keep all follow-up visits as directed by your health care provider. This is important. Get help right away if:  You feel lightheaded or you faint.  You have trouble breathing.  You feel pain in your chest, upper arm, or jaw.  You feel sick to your stomach and your skin is pale, cool, or damp.  You heartbeat feels unusual or abnormal. This information is not intended to replace advice given to you by your health care provider. Make sure you discuss  any questions you have with your health care provider. Document Released: 04/04/2004 Document Revised: 12/13/2015 Document Reviewed: 02/13/2014 Elsevier Interactive Patient Education  [employer].

## 2018-04-18 NOTE — MAU Provider Note (Signed)
Chief Complaint: Dizziness; Loss of Consciousness; Shortness of Breath; and Tachycardia   First Provider Initiated Contact with Patient 04/18/18 1638     SUBJECTIVE HPI: Andrea Dean is a 32 y.o. [id number] at [redacted]w[redacted]d who presents to Maternity Admissions reporting 1 or 2 syncopal episodes and several near syncopal episodes since 04/16/2018.  Episodes are preceded by sensation of rapid heartbeat, feeling hot, dizzy and short of breath.  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  Patient states she was on medication with her class when she started feeling previously listed symptoms and then woke up lying on the floor with her teacher and classmates around her.  She states she does not remember how she got to the floor.  Does not think she hit her head.  She states she had a similar but very brief episode later in the afternoon and has had several other episodes since then, but was able to recognize the prodrome and sat down first and did not pass out during subsequent episodes.  Symptoms resolved spontaneously.  No history of similar symptoms. 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  Has been eating and drinking normally.  No nausea or vomiting.  Gets prenatal care at [facility].  Called on-call provider on Friday when first episode happened.  States she was told to take it easy and to come to maternity admissions if symptoms continued.  Context: [redacted] weeks gestation.  Timing: Intermittent Modifying factors: None.  Has not tried anything to treat her symptoms. Associated signs and symptoms: Negative for fever, chills, nausea, vomiting or confusion.  Positive for intermittent headaches (none now).   Past Medical History:  Diagnosis Date  . Abnormal Pap smear    repeat WNL  . Chlamydia   . Fracture closed, fibula, shaft 2007   left, no surgical repair  . GERD (gastroesophageal reflux disease)   . H/O seasonal allergies   . Herpes  genitalis in women   . MRSA (methicillin resistant staph aureus) culture positive   . Ovarian cyst   . Preterm labor   . Thrombocytopenia (HCC)    2006 DURING PREGNANCY - NO PROBLEMS WITH DELIVERY OR ANY KNOWN PROBLEMS SINCE  . Urinary tract infection   . Urticaria    OB History  Gravida Para Term Preterm AB Living  8 3 3   4 3   SAB TAB Ectopic Multiple Live Births  1 2 1  0 3    # Outcome Date GA Lbr Len/2nd Weight Sex Delivery Anes PTL Lv  8 Current           7 Term 10/16/15 [redacted]w[redacted]d 05:03 / 00:18 2835 g F VBAC EPI  LIV  6 Term 12/28/07     VBAC   LIV     Birth Comments: IOL for oligo & SGA per patient  5 Term 07/21/04     CS-Unspec   LIV     Birth Comments: c/section for failure to progress  4 Ectopic           3 SAB           2 TAB           1 TAB            Past Surgical History:  Procedure Laterality Date  . CESAREAN SECTION    . I & D OF PILONIADAL CYST IN OFFICE - LOCAL - FELT JITTERY AFTERWARDS    . PILONIDAL CYST EXCISION  N/A 05/05/2014   Procedure:  EXCISION PILONIDAL CYST;  Surgeon: Romie Levee, MD;  Location: [facility];  Service: General;  Laterality: N/A;  . WISDOM TOOTH EXTRACTION  2004   Social History   Socioeconomic History  . Marital status: Married    Spouse name: Not on file  . Number of children: Not on file  . Years of education: Not on file  . Highest education level: Not on file  Occupational History  . Not on file  Social Needs  . Financial resource strain: Not on file  . Food insecurity:    Worry: Not on file    Inability: Not on file  . Transportation needs:    Medical: Not on file    Non-medical: Not on file  Tobacco Use  . Smoking status: Never Smoker  . Smokeless tobacco: Never Used  Substance and Sexual Activity  . Alcohol use: No    Alcohol/week: 0.0 standard drinks    Comment: occ  . Drug use: No  . Sexual activity: Yes    Partners: Male    Birth control/protection: None  Lifestyle  . Physical activity:    Days per week:  Not on file    Minutes per session: Not on file  . Stress: Not on file  Relationships  . Social connections:    Talks on phone: Not on file    Gets together: Not on file    Attends religious service: Not on file    Active member of club or organization: Not on file    Attends meetings of clubs or organizations: Not on file    Relationship status: Not on file  . Intimate partner violence:    Fear of current or ex partner: Not on file    Emotionally abused: Not on file    Physically abused: Not on file    Forced sexual activity: Not on file  Other Topics Concern  . Not on file  Social History Narrative  . Not on file   Family History  Problem Relation Age of Onset  . Cancer Paternal Grandmother        brain, lung  . Diabetes Paternal Grandmother   . Heart disease Paternal Grandmother   . Hypertension Paternal Grandmother   . Hypertension Maternal Grandmother   . Stroke Maternal Grandmother   . Anesthesia problems Neg Hx   . Hypotension Neg Hx   . Malignant hyperthermia Neg Hx   . Pseudochol deficiency Neg Hx   . Allergic rhinitis Neg Hx   . Asthma Neg Hx   . Angioedema Neg Hx   . Eczema Neg Hx   . Immunodeficiency Neg Hx   . Urticaria Neg Hx    No current facility-administered medications on file prior to encounter.    Current Outpatient Medications on File Prior to Encounter  Medication Sig Dispense Refill  . acetaminophen (TYLENOL) 500 MG tablet Take 500-1,000 mg by mouth daily as needed for moderate pain. Takes 1- 2 tablets as needed    . Calcium Carbonate Antacid (TUMS PO) Take 1 tablet by mouth daily as needed. Takes up to 2 tablets if needed, depending on severity of reflux    . cetirizine (ZYRTEC) 10 MG tablet Take 10 mg by mouth daily as needed for allergies.    Marland Kitchen doxylamine, Sleep, (UNISOM) 25 MG tablet Take 1 tablet (25 mg total) by mouth at bedtime as needed. 30 tablet 0  . Doxylamine-Pyridoxine (DICLEGIS) 10-10 MG TBEC 1 tab in AM, 1 tab  mid afternoon 2 tabs  at bedtime. Max dose 4 tabs daily. 100 tablet 5  . ibuprofen (ADVIL,MOTRIN) 600 MG tablet Take 1 tablet (600 mg total) by mouth every 6 (six) hours. 30 tablet 0  . pyridOXINE (VITAMIN B-6) 100 MG tablet Take 1 tablet (100 mg total) by mouth at bedtime. 30 tablet 5  . valACYclovir (VALTREX) 1000 MG tablet Take 1 tablet (1,000 mg total) by mouth daily. 30 tablet 11   Allergies  Allergen Reactions  . Flagyl [Metronidazole Hcl] Hives and Nausea And Vomiting  . Latex     sensitive    I have reviewed patient's Past Medical Hx, Surgical Hx, Family Hx, Social Hx, medications and allergies.   Review of Systems  Constitutional: Negative for chills, fatigue and fever.  HENT: Negative for congestion.   Respiratory: Positive for shortness of breath. Negative for cough, chest tightness and wheezing.   Cardiovascular: Positive for palpitations. Negative for chest pain.  Gastrointestinal: Negative for abdominal pain, nausea and vomiting.  Neurological: Positive for dizziness, syncope and headaches. Negative for tremors, seizures and speech difficulty.  Psychiatric/Behavioral: Negative for confusion.    OBJECTIVE Patient Vitals for the past 24 hrs:  BP Temp Temp src Pulse Resp SpO2 Weight  04/18/18 1541 125/72 98 F (36.7 C) Oral (!) 104 16 100 % -  04/18/18 1525 - - - - - - 67.4 kg  Continuous pulse ox: 90-110's  Constitutional: Well-developed, well-nourished female in no acute distress.  Skin: No pallor or diaphoresis Cardiovascular: Regular rate and rhythm. No M/R/G Respiratory: normal rate and effort. CTAB GI: Abd soft, non-tender, gravid appropriate for gestational age.  MS: Extremities nontender, no edema, Neg Homan's Neurologic: Alert and oriented x 4. Nml gait and speech GU: Deferred  FHR 156 by doppler  LAB RESULTS Results for orders placed or performed during the hospital encounter of 04/18/18 (from the past 24 hour(s))  CBC     Status: Abnormal   Collection Time: 04/18/18   3:39 PM  Result Value Ref Range   WBC 10.7 (H) 4.0 - 10.5 K/uL   RBC 3.49 (L) 3.87 - 5.11 MIL/uL   Hemoglobin 11.8 (L) 12.0 - 15.0 g/dL   HCT 16.1 (L) 09.6 - 04.5 %   MCV 99.4 78.0 - 100.0 fL   MCH 33.8 26.0 - 34.0 pg   MCHC 34.0 30.0 - 36.0 g/dL   RDW 40.9 81.1 - 91.4 %   Platelets 268 150 - 400 K/uL  Comprehensive metabolic panel     Status: Abnormal   Collection Time: 04/18/18  3:39 PM  Result Value Ref Range   Sodium 136 135 - 145 mmol/L   Potassium 3.6 3.5 - 5.1 mmol/L   Chloride 105 98 - 111 mmol/L   CO2 25 22 - 32 mmol/L   Glucose, Bld 84 70 - 99 mg/dL   BUN 7 6 - 20 mg/dL   Creatinine, Ser 7.82 0.44 - 1.00 mg/dL   Calcium 8.9 8.9 - 95.6 mg/dL   Total Protein 7.3 6.5 - 8.1 g/dL   Albumin 3.1 (L) 3.5 - 5.0 g/dL   AST 16 15 - 41 U/L   ALT 11 0 - 44 U/L   Alkaline Phosphatase 39 38 - 126 U/L   Total Bilirubin 0.8 0.3 - 1.2 mg/dL   GFR calc non Af Amer >60 >60 mL/min   GFR calc Af Amer >60 >60 mL/min   Anion gap 6 5 - 15  Glucose, capillary     Status: Abnormal  Collection Time: 04/18/18  3:53 PM  Result Value Ref Range   Glucose-Capillary 69 (L) 70 - 99 mg/dL  Urinalysis, Routine w reflex microscopic     Status: Abnormal   Collection Time: 04/18/18  3:54 PM  Result Value Ref Range   Color, Urine YELLOW YELLOW   APPearance HAZY (A) CLEAR   Specific Gravity, Urine 1.012 1.005 - 1.030   pH 7.0 5.0 - 8.0   Glucose, UA NEGATIVE NEGATIVE mg/dL   Hgb urine dipstick NEGATIVE NEGATIVE   Bilirubin Urine NEGATIVE NEGATIVE   Ketones, ur NEGATIVE NEGATIVE mg/dL   Protein, ur NEGATIVE NEGATIVE mg/dL   Nitrite NEGATIVE NEGATIVE   Leukocytes, UA NEGATIVE NEGATIVE  TSH     Status: None   Collection Time: 04/18/18  5:34 PM  Result Value Ref Range   TSH 0.828 0.350 - 4.500 uIU/mL  Magnesium     Status: None   Collection Time: 04/18/18  5:34 PM  Result Value Ref Range   Magnesium 2.0 1.7 - 2.4 mg/dL  Glucose, capillary     Status: None   Collection Time: 04/18/18  6:45 PM   Result Value Ref Range   Glucose-Capillary 82 70 - 99 mg/dL    EKG Prelim: NSR w/ possible left atrial enlargement. Reviewed w/ Cardiologist. Nml EKG.   MAU COURSE Orders Placed This Encounter  Procedures  . Urinalysis, Routine w reflex microscopic  . CBC  . Comprehensive metabolic panel  . Glucose, capillary  . TSH  . Magnesium  . Glucose, capillary  . Pulse oximetry, continuous  . EKG 12-Lead  . Discharge patient   No orders of the defined types were placed in this encounter.  Discussed Hx, labs, exam w/ Cardiologist. New orders: Magnesium level, TSH, consider OP Holter monitoring.   Discussed Hx, labs, exam w/ Dr. Charlotta Newton. Agrees w/ POC. New orders: Will arrange OP Cardiology referral for Holter monitoring.   MDM - Syncopal episodes possibly cardiac etiology. Pt asymptomatic in MAU. Has not had any syncope in two days. CBG borderline-low in MAU today. Encouraged frequent snacks/meals w/ complex carbs and protein. Have simple carb serving for acute Sx of hypoglycemia. Also encouraged to push fluids as pt thinks dehydration may have been an issue. If tachycardic episodes are causing Sx, dehydration could exacerbate tachycardia. Pt verbalizes being able to recognize the prodrome to these episodes and was encouraged to sit down or lie down if it starts again.   ASSESSMENT 1. Syncope, unspecified syncope type   2. Heart palpitations     PLAN Discharge home in stable condition per consult w/ Dr. Charlotta Newton. Syncope precautions Follow-up Information    Myna Hidalgo, DO Follow up.   Specialty:  Obstetrics and Gynecology Why:  This Thursday or Friday for follow-up appointment Contact information: [address] [place] [address] [phone]        [facility] [place] MATERNITY ADMISSIONS Follow up.   Why:  As needed in pregnancy emergencies Contact information: [address] [id number] [address]c [place]  [address] [phone]         Allergies as of 04/18/2018      Reactions   Flagyl [metronidazole Hcl] Hives, Nausea And Vomiting   Latex    sensitive      Medication List    TAKE these medications   acetaminophen 500 MG tablet Commonly known as:  TYLENOL Take 500-1,000 mg by mouth daily as needed for moderate pain. Takes 1- 2 tablets as needed  cetirizine 10 MG tablet Commonly known as:  ZYRTEC Take 10 mg by mouth daily as needed for allergies.   doxylamine (Sleep) 25 MG tablet Commonly known as:  UNISOM Take 1 tablet (25 mg total) by mouth at bedtime as needed.   Doxylamine-Pyridoxine 10-10 MG Tbec 1 tab in AM, 1 tab mid afternoon 2 tabs at bedtime. Max dose 4 tabs daily.   ibuprofen 600 MG tablet Commonly known as:  ADVIL,MOTRIN Take 1 tablet (600 mg total) by mouth every 6 (six) hours.   pyridOXINE 100 MG tablet Commonly known as:  VITAMIN B-6 Take 1 tablet (100 mg total) by mouth at bedtime.   TUMS PO Take 1 tablet by mouth daily as needed. Takes up to 2 tablets if needed, depending on severity of reflux   valACYclovir 1000 MG tablet Commonly known as:  VALTREX Take 1 tablet (1,000 mg total) by mouth daily.        Katrinka Blazing, [place], [place] 04/18/2018  7:17 PM

## 2018-04-18 NOTE — MAU Note (Signed)
Andrea Dean is a 32 y.o. at [redacted]w[redacted]d here in MAU reporting: syncopal episode on Friday. Reports that she remembers feeling hot and that her heart was racing. After that she remembers waking up from passing out in clinical. Called her OB and they told her to continue to monitor and take it easy through the weekend. However she has continued to have "recurrent close syncopal episodes" since then, which is what brings her in today.  +braxton hicks +shob on exertion (ex walking from car to church or helping with her 32 yr old) +dizziness Pain score: denies Vitals:   04/18/18 1541  BP: 125/72  Pulse: (!) 104  Resp: 16  Temp: 98 F (36.7 C)  SpO2: 100%    FHT:158 via doppler Lab orders placed from triage: ua

## 2018-05-04 ENCOUNTER — Ambulatory Visit (INDEPENDENT_AMBULATORY_CARE_PROVIDER_SITE_OTHER): Payer: No Typology Code available for payment source | Admitting: Internal Medicine

## 2018-05-04 ENCOUNTER — Encounter: Payer: Self-pay | Admitting: Internal Medicine

## 2018-05-04 VITALS — BP 109/66 | HR 100 | Ht 63.0 in | Wt 152.0 lb

## 2018-05-04 DIAGNOSIS — R55 Syncope and collapse: Secondary | ICD-10-CM | POA: Diagnosis not present

## 2018-05-04 DIAGNOSIS — R002 Palpitations: Secondary | ICD-10-CM | POA: Insufficient documentation

## 2018-05-04 DIAGNOSIS — Z3A24 24 weeks gestation of pregnancy: Secondary | ICD-10-CM

## 2018-05-04 NOTE — Patient Instructions (Addendum)
Medication Instructions:  Continue current medications If you need a refill on your cardiac medications before your next appointment, please call your pharmacy.   Testing/Procedures: Your physician has requested that you have an echocardiogram. Echocardiography is a painless test that uses sound waves to create images of your heart. It provides your doctor with information about the size and shape of your heart and how well your heart's chambers and valves are working. This procedure takes approximately one hour. There are no restrictions for this procedure.  Your physician has recommended that you wear an event monitor. Event monitors are medical devices that record the heart's electrical activity. Doctors most often [place] these monitors to diagnose arrhythmias. Arrhythmias are problems with the speed or rhythm of the heartbeat. The monitor is a small, portable device. You can wear one while you do your normal daily activities. This is usually used to diagnose what is causing palpitations/syncope (passing out). -- 3 day ZIO monitor  ** both appointments are at [address] - [address] Floor  Follow-Up: At [facility], you and your health needs are our priority.  As part of our continuing mission to provide you with exceptional heart care, we have created designated Provider Care Teams.  These Care Teams include your primary Cardiologist (physician) and Advanced Practice Providers (APPs -  Physician Assistants and Nurse Practitioners) who all work together to provide you with the care you need, when you need it. You will need a follow up appointment in about 3-4 weeks (after echo & monitor).  You may see Dr. Rennis Golden or one of the following Advanced Practice Providers on your designated Care Team: Azalee Course, [place] . Micah Flesher, PA-C  Any Other Special Instructions Will Be Listed Below (If Applicable).

## 2018-05-04 NOTE — Progress Notes (Signed)
OFFICE CONSULT NOTE  Chief Complaint:  Palpitations, syncope  Primary Care Physician: Tally Joe, MD  HPI:  Andrea Dean is a 32 y.o. female who is being seen today for the evaluation of palpitations, syncope at the request of Myna Hidalgo, DO.  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  She is [redacted] weeks pregnant with her fourth child.  She is also studying to be a Engineer, civil (consulting) at [employer].  She has had no issues with her previous pregnancies however recently had a syncopal episode.  She was doing clinicals and she felt her heart racing.  She then leaned up against a crash cart and then ultimately passed out.  She is had several episodes of recurrent tachypalpitations.  They seem to be coming more frequently.  She has had no issues with a high blood pressure during the pregnancy.  She denies any significant anemia.  In fact she tends to run a lower blood pressure.  She also reports resting tachycardia, with heart rate oftentimes in the 120-130 range.  PMHx:  Past Medical History:  Diagnosis Date  . Abnormal Pap smear    repeat WNL  . Chlamydia   . Fracture closed, fibula, shaft 2007   left, no surgical repair  . GERD (gastroesophageal reflux disease)   . H/O seasonal allergies   . Herpes genitalis in women   . MRSA (methicillin resistant staph aureus) culture positive   . Ovarian cyst   . Preterm labor   . Thrombocytopenia (HCC)    2006 DURING PREGNANCY - NO PROBLEMS WITH DELIVERY OR ANY KNOWN PROBLEMS SINCE  . Urinary tract infection   . Urticaria     Past Surgical History:  Procedure Laterality Date  . CESAREAN SECTION    . I & D OF PILONIADAL CYST IN OFFICE - LOCAL - FELT JITTERY AFTERWARDS    . PILONIDAL CYST EXCISION N/A 05/05/2014   Procedure:  EXCISION PILONIDAL CYST;  Surgeon: Romie Levee, MD;  Location: [facility];  Service: General;  Laterality: N/A;  . WISDOM TOOTH EXTRACTION  2004    FAMHx:  Family History  Problem Relation Age  of Onset  . Cancer Paternal Grandmother        brain, lung  . Diabetes Paternal Grandmother   . Heart disease Paternal Grandmother   . Hypertension Paternal Grandmother   . Hypertension Maternal Grandmother   . Stroke Maternal Grandmother   . Anesthesia problems Neg Hx   . Hypotension Neg Hx   . Malignant hyperthermia Neg Hx   . Pseudochol deficiency Neg Hx   . Allergic rhinitis Neg Hx   . Asthma Neg Hx   . Angioedema Neg Hx   . Eczema Neg Hx   . Immunodeficiency Neg Hx   . Urticaria Neg Hx     SOCHx:   reports that she has never smoked. She has never used smokeless tobacco. She reports that she does not drink alcohol or use drugs.  ALLERGIES:  Allergies  Allergen Reactions  . Flagyl [Metronidazole Hcl] Hives and Nausea And Vomiting  . Latex     sensitive    ROS: Pertinent items noted in HPI and remainder of comprehensive ROS otherwise negative.  HOME MEDS: Current Outpatient Medications on File Prior to Visit  Medication Sig Dispense Refill  . acetaminophen (TYLENOL) 500 MG tablet Take 500-1,000 mg by mouth daily as needed for moderate pain. Takes 1- 2 tablets as needed    . Calcium Carbonate  Antacid (TUMS PO) Take 1 tablet by mouth daily as needed. Takes up to 2 tablets if needed, depending on severity of reflux    . cetirizine (ZYRTEC) 10 MG tablet Take 10 mg by mouth daily as needed for allergies.    . Doxylamine-Pyridoxine (DICLEGIS) 10-10 MG TBEC 1 tab in AM, 1 tab mid afternoon 2 tabs at bedtime. Max dose 4 tabs daily. 100 tablet 5  . valACYclovir (VALTREX) 1000 MG tablet Take 1 tablet (1,000 mg total) by mouth daily. 30 tablet 11   No current facility-administered medications on file prior to visit.     LABS/IMAGING: No results found for this or any previous visit (from the past 48 hour(s)). No results found.  LIPID PANEL:    Component Value Date/Time   CHOL 170 06/13/2014 1711   CHOL 170 06/13/2014 1711   TRIG 42 06/13/2014 1711   TRIG 42 06/13/2014  1711   HDL 54 06/13/2014 1711   HDL 54 06/13/2014 1711   CHOLHDL 3.1 06/13/2014 1711   VLDL 8 06/13/2014 1711   LDLCALC 108 (H) 06/13/2014 1711    WEIGHTS: Wt Readings from Last 3 Encounters:  05/04/18 152 lb (68.9 kg)  04/18/18 148 lb 9.6 oz (67.4 kg)  10/09/17 129 lb (58.5 kg)    VITALS: BP 109/66   Pulse 100   Ht 5\' 3"  (1.6 m)   Wt 152 lb (68.9 kg)   BMI 26.93 kg/m   EXAM: General appearance: alert and no distress Neck: no carotid bruit, no JVD and thyroid not enlarged, symmetric, no tenderness/mass/nodules Lungs: clear to auscultation bilaterally Heart: regular rate and rhythm, S1, S2 normal, no murmur, click, rub or gallop Abdomen: Gravid uterus Extremities: extremities normal, atraumatic, no cyanosis or edema Pulses: 2+ and symmetric Skin: Skin color, texture, turgor normal. No rashes or lesions Neurologic: Grossly normal Psych: Pleasant  EKG: Sinus rhythm with possible left atrial enlargement (04/18/2018)-personally reviewed   ASSESSMENT: 1. Syncope 2. Tachycardia 3. [redacted] weeks pregnant  PLAN: 1.   Mrs. Hirst is [redacted] weeks pregnant and has been experiencing episodes of paroxysmal tachycardia and had one syncopal episode.  She said her heart was racing before this.  At times even at rest her heart rate is elevated.  She has no history of palpitations and is experiencing some shortness of breath which seems out of proportion to her pregnancy.  She denies any worsening edema, orthopnea or other heart failure symptoms.  I like to get an echocardiogram to rule out any structural abnormalities.  In addition will place a 3-day 0 patch monitor to see if we can pick up on any tachypalpitations.  Follow-up with me afterwards.  Thanks again for the kind referral.  Chrystie Nose, MD, [facility]  [place]  [facility]  Medical Director of the Advanced Lipid Disorders &  Cardiovascular Risk Reduction Clinic Diplomate of the American Board of Clinical  Lipidology Attending Cardiologist  Direct Dial: [phone]  Fax: [phone]  Website:  [email or web address] Nicely Reveca Desmarais 05/04/2018, 4:51 PM

## 2018-05-05 ENCOUNTER — Other Ambulatory Visit: Payer: Self-pay | Admitting: Obstetrics

## 2018-05-05 DIAGNOSIS — M5441 Lumbago with sciatica, right side: Secondary | ICD-10-CM

## 2018-05-05 MED ORDER — COMFORT FIT MATERNITY SUPP SM MISC: 0 refills | Status: DC

## 2018-05-13 ENCOUNTER — Ambulatory Visit (HOSPITAL_COMMUNITY): Payer: No Typology Code available for payment source | Attending: Internal Medicine

## 2018-05-13 ENCOUNTER — Ambulatory Visit (INDEPENDENT_AMBULATORY_CARE_PROVIDER_SITE_OTHER): Payer: No Typology Code available for payment source

## 2018-05-13 DIAGNOSIS — R55 Syncope and collapse: Secondary | ICD-10-CM

## 2018-05-13 DIAGNOSIS — R002 Palpitations: Secondary | ICD-10-CM

## 2018-05-20 ENCOUNTER — Telehealth: Payer: Self-pay | Admitting: Internal Medicine

## 2018-05-20 NOTE — Telephone Encounter (Signed)
° °  Advised patient she would be contact when results from 3 day ZIO available.

## 2018-05-26 ENCOUNTER — Encounter: Payer: Self-pay | Admitting: Internal Medicine

## 2018-05-26 ENCOUNTER — Ambulatory Visit (INDEPENDENT_AMBULATORY_CARE_PROVIDER_SITE_OTHER): Payer: No Typology Code available for payment source | Admitting: Internal Medicine

## 2018-05-26 VITALS — BP 118/64 | HR 101 | Ht 64.0 in | Wt 153.0 lb

## 2018-05-26 DIAGNOSIS — R002 Palpitations: Secondary | ICD-10-CM | POA: Diagnosis not present

## 2018-05-26 DIAGNOSIS — R55 Syncope and collapse: Secondary | ICD-10-CM | POA: Diagnosis not present

## 2018-05-26 NOTE — Progress Notes (Signed)
OFFICE CONSULT NOTE  Chief Complaint:  Palpitations, syncope  Primary Care Physician: Tally Joe, MD  HPI:  Andrea Dean is a 32 y.o. female who is being seen today for the evaluation of palpitations, syncope at the request of Tally Joe, MD.  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  She is [redacted] weeks pregnant with her fourth child.  She is also studying to be a Engineer, civil (consulting) at [employer].  She has had no issues with her previous pregnancies however recently had a syncopal episode.  She was doing clinicals and she felt her heart racing.  She then leaned up against a crash cart and then ultimately passed out.  She is had several episodes of recurrent tachypalpitations.  They seem to be coming more frequently.  She has had no issues with a high blood pressure during the pregnancy.  She denies any significant anemia.  In fact she tends to run a lower blood pressure.  She also reports resting tachycardia, with heart rate oftentimes in the 120-130 range.  05/26/2018  Ms. Westfall returns today for follow-up.  She underwent an echocardiogram in April 23, 2018 which showed no significant echo abnormalities.  LV function was normal.  She also wore a ZIO patch monitor.  This did not show any significant arrhythmias to explain her symptoms.  I did inform her of those results today.  She still has episodes of tachycardia which continue.  She feels that they are paroxysmal and suspects that they are arrhythmias.  Predominantly what I thought was a sinus tachycardia.  It may be an appropriate.  At this point I do not think the risk of medical therapy with a beta-blocker would outweigh the benefits of treatment.  PMHx:  Past Medical History:  Diagnosis Date  . Abnormal Pap smear    repeat WNL  . Chlamydia   . Fracture closed, fibula, shaft 2007   left, no surgical repair  . GERD (gastroesophageal reflux disease)   . H/O seasonal allergies   . Herpes genitalis in women     . MRSA (methicillin resistant staph aureus) culture positive   . Ovarian cyst   . Preterm labor   . Thrombocytopenia (HCC)    2006 DURING PREGNANCY - NO PROBLEMS WITH DELIVERY OR ANY KNOWN PROBLEMS SINCE  . Urinary tract infection   . Urticaria     Past Surgical History:  Procedure Laterality Date  . CESAREAN SECTION    . I & D OF PILONIADAL CYST IN OFFICE - LOCAL - FELT JITTERY AFTERWARDS    . PILONIDAL CYST EXCISION N/A 05/05/2014   Procedure:  EXCISION PILONIDAL CYST;  Surgeon: Romie Levee, MD;  Location: [facility];  Service: General;  Laterality: N/A;  . WISDOM TOOTH EXTRACTION  2004    FAMHx:  Family History  Problem Relation Age of Onset  . Cancer Paternal Grandmother        brain, lung  . Diabetes Paternal Grandmother   . Heart disease Paternal Grandmother   . Hypertension Paternal Grandmother   . Hypertension Maternal Grandmother   . Stroke Maternal Grandmother   . Anesthesia problems Neg Hx   . Hypotension Neg Hx   . Malignant hyperthermia Neg Hx   . Pseudochol deficiency Neg Hx   . Allergic rhinitis Neg Hx   . Asthma Neg Hx   . Angioedema Neg Hx   . Eczema Neg Hx   . Immunodeficiency Neg Hx   . Urticaria Neg  Hx     SOCHx:   reports that she has never smoked. She has never used smokeless tobacco. She reports that she does not drink alcohol or use drugs.  ALLERGIES:  Allergies  Allergen Reactions  . Flagyl [Metronidazole Hcl] Hives and Nausea And Vomiting  . Latex     sensitive    ROS: Pertinent items noted in HPI and remainder of comprehensive ROS otherwise negative.  HOME MEDS: Current Outpatient Medications on File Prior to Visit  Medication Sig Dispense Refill  . acetaminophen (TYLENOL) 500 MG tablet Take 500-1,000 mg by mouth daily as needed for moderate pain. Takes 1- 2 tablets as needed    . Calcium Carbonate Antacid (TUMS PO) Take 1 tablet by mouth daily as needed. Takes up to 2 tablets if needed, depending on severity of reflux    .  cetirizine (ZYRTEC) 10 MG tablet Take 10 mg by mouth daily as needed for allergies.    . Doxylamine-Pyridoxine (DICLEGIS) 10-10 MG TBEC 1 tab in AM, 1 tab mid afternoon 2 tabs at bedtime. Max dose 4 tabs daily. 100 tablet 5  . Elastic Bandages & Supports (COMFORT FIT MATERNITY SUPP SM) MISC Wear as directed. 1 each 0  . valACYclovir (VALTREX) 1000 MG tablet Take 1 tablet (1,000 mg total) by mouth daily. 30 tablet 11   No current facility-administered medications on file prior to visit.     LABS/IMAGING: No results found for this or any previous visit (from the past 48 hour(s)). No results found.  LIPID PANEL:    Component Value Date/Time   CHOL 170 06/13/2014 1711   CHOL 170 06/13/2014 1711   TRIG 42 06/13/2014 1711   TRIG 42 06/13/2014 1711   HDL 54 06/13/2014 1711   HDL 54 06/13/2014 1711   CHOLHDL 3.1 06/13/2014 1711   VLDL 8 06/13/2014 1711   LDLCALC 108 (H) 06/13/2014 1711    WEIGHTS: Wt Readings from Last 3 Encounters:  05/26/18 153 lb (69.4 kg)  05/04/18 152 lb (68.9 kg)  04/18/18 148 lb 9.6 oz (67.4 kg)    VITALS: BP 118/64 (BP Location: Right Arm, Patient Position: Sitting, Cuff Size: Normal)   Pulse (!) 101   Ht 5\' 4"  (1.626 m)   Wt 153 lb (69.4 kg)   BMI 26.26 kg/m   EXAM: Deferred  EKG: Deferred  ASSESSMENT: 1. Syncope 2. Tachycardia 3. [redacted] weeks pregnant  PLAN: 1.   Mrs. Crago has not had any further syncope but does have persistent and an intermittent tachycardia.  I suspect these episodes of sinus tachycardia and from what I see on the monitor appears that way.  Onset and offset are gradual and not quick as if a SVT.  I would not recommend beta-blocker at this time as she is tolerating the symptoms well.  Most likely she should continue to term and then we would need to monitor to see if her symptoms improve.  Ultimately if her symptoms persist or are not improved more than 4 weeks out after delivery, they need medical therapy with beta-blocker or  electrophysiology evaluation.  Follow-up with me in 3 months.  Chrystie Nose, MD, [facility], FACP  [place]  [facility]  Medical Director of the [facility]  Cardiovascular [facility] Reduction Clinic Diplomate of the American Board of Clinical Lipidology Attending Cardiologist  Direct Dial: [phone]  Fax: [phone]  Website:  [email or web address] Cinzia [email or web address] 05/26/2018, 8:53 AM

## 2018-05-26 NOTE — Patient Instructions (Signed)
Medication Instructions:  Continue current medications If you need a refill on your cardiac medications before your next appointment, please call your pharmacy.   Follow-Up: At [facility], you and your health needs are our priority.  As part of our continuing mission to provide you with exceptional heart care, we have created designated Provider Care Teams.  These Care Teams include your primary Cardiologist (physician) and Advanced Practice Providers (APPs -  Physician Assistants and Nurse Practitioners) who all work together to provide you with the care you need, when you need it. You will need a follow up appointment in 3 months.   You may see Dr. Hilty or one of the following Advanced Practice Providers on your designated Care Team: Hao Meng, PA-C . Angela Duke, PA-C  Any Other Special Instructions Will Be Listed Below (If Applicable).    

## 2018-05-27 ENCOUNTER — Encounter: Payer: Self-pay | Admitting: Internal Medicine

## 2018-07-04 ENCOUNTER — Inpatient Hospital Stay (HOSPITAL_COMMUNITY)
Admission: AD | Admit: 2018-07-04 | Discharge: 2018-07-04 | Disposition: A | Discharge status: Home / Self Care | Payer: No Typology Code available for payment source | Source: Ambulatory Visit | Attending: Obstetrics & Gynecology | Admitting: Obstetrics & Gynecology

## 2018-07-04 ENCOUNTER — Encounter (HOSPITAL_COMMUNITY): Payer: Self-pay | Admitting: *Deleted

## 2018-07-04 DIAGNOSIS — O4703 False labor before 37 completed weeks of gestation, third trimester: Secondary | ICD-10-CM | POA: Diagnosis present

## 2018-07-04 DIAGNOSIS — Z3A33 33 weeks gestation of pregnancy: Secondary | ICD-10-CM

## 2018-07-04 LAB — URINALYSIS, ROUTINE W REFLEX MICROSCOPIC
Bilirubin Urine: NEGATIVE
Glucose, UA: NEGATIVE mg/dL
Hgb urine dipstick: NEGATIVE
Ketones, ur: NEGATIVE mg/dL
Nitrite: NEGATIVE
Protein, ur: NEGATIVE mg/dL
Specific Gravity, Urine: 1.015 (ref 1.005–1.030)
pH: 6.5 (ref 5.0–8.0)

## 2018-07-04 LAB — URINALYSIS, MICROSCOPIC (REFLEX): RBC / HPF: NONE SEEN RBC/hpf (ref 0–5)

## 2018-07-04 MED ORDER — NIFEDIPINE 10 MG PO CAPS
10.0000 mg | ORAL_CAPSULE | ORAL | Status: AC | PRN
  Administered 2018-07-04 (×3): 10 mg via ORAL
  Filled 2018-07-04 (×3): qty 1

## 2018-07-04 MED ORDER — LACTATED RINGERS IV BOLUS
1000.0000 mL | Freq: Once | INTRAVENOUS | Status: AC
  Administered 2018-07-04: 1000 mL via INTRAVENOUS

## 2018-07-04 MED ORDER — TERBUTALINE SULFATE 1 MG/ML IJ SOLN
0.2500 mg | Freq: Once | INTRAMUSCULAR | Status: DC
  Filled 2018-07-04: qty 1

## 2018-07-04 MED ORDER — NIFEDIPINE 10 MG PO CAPS: 10.0000 mg | ORAL_CAPSULE | Freq: Four times a day (QID) | ORAL | 0 refills | Status: DC

## 2018-07-04 NOTE — MAU Provider Note (Signed)
History     CSN: [id number]  Arrival date and time: 07/04/18 1716   First Provider Initiated Contact with Patient 07/04/18 1814      Chief Complaint  Patient presents with  . Contractions  . vaginal pressure   HPI Andrea Dean is a 32 y.o. [id number] at [redacted]w[redacted]d who presents with contractions. She states they started this morning and have continued. She states she does not feel like she is in labor but she had this happen about a week ago and was 2cm in the office. She had a negative FFN at that time. She was told to come in if this happened again. She denies any leaking or bleeding. Reports normal fetal movement.   OB History    Gravida  8   Para  3   Term  3   Preterm      AB  4   Living  3     SAB  1   TAB  2   Ectopic  1   Multiple  0   Live Births  3           Past Medical History:  Diagnosis Date  . Abnormal Pap smear    repeat WNL  . Chlamydia   . Fracture closed, fibula, shaft 2007   left, no surgical repair  . GERD (gastroesophageal reflux disease)   . H/O seasonal allergies   . Herpes genitalis in women   . MRSA (methicillin resistant staph aureus) culture positive   . Ovarian cyst   . Preterm labor   . Thrombocytopenia (HCC)    2006 DURING PREGNANCY - NO PROBLEMS WITH DELIVERY OR ANY KNOWN PROBLEMS SINCE  . Urinary tract infection   . Urticaria     Past Surgical History:  Procedure Laterality Date  . CESAREAN SECTION    . I & D OF PILONIADAL CYST IN OFFICE - LOCAL - FELT JITTERY AFTERWARDS    . PILONIDAL CYST EXCISION N/A 05/05/2014   Procedure:  EXCISION PILONIDAL CYST;  Surgeon: Romie Levee, MD;  Location: [facility];  Service: General;  Laterality: N/A;  . WISDOM TOOTH EXTRACTION  2004    Family History  Problem Relation Age of Onset  . Cancer Paternal Grandmother        brain, lung  . Diabetes Paternal Grandmother   . Heart disease Paternal Grandmother   . Hypertension Paternal Grandmother   . Hypertension Maternal Grandmother    . Stroke Maternal Grandmother   . Anesthesia problems Neg Hx   . Hypotension Neg Hx   . Malignant hyperthermia Neg Hx   . Pseudochol deficiency Neg Hx   . Allergic rhinitis Neg Hx   . Asthma Neg Hx   . Angioedema Neg Hx   . Eczema Neg Hx   . Immunodeficiency Neg Hx   . Urticaria Neg Hx     Social History   Tobacco Use  . Smoking status: Never Smoker  . Smokeless tobacco: Never Used  Substance Use Topics  . Alcohol use: No    Alcohol/week: 0.0 standard drinks    Comment: occ  . Drug use: No    Allergies:  Allergies  Allergen Reactions  . Flagyl [Metronidazole Hcl] Hives and Nausea And Vomiting  . Latex     sensitive    Medications Prior to Admission  Medication Sig Dispense Refill Last Dose  . acetaminophen (TYLENOL) 500 MG tablet Take 500-1,000 mg by mouth daily as needed for moderate pain. Takes 1- 2 tablets as  needed   Past Month at Unknown time  . Calcium Carbonate Antacid (TUMS PO) Take 1 tablet by mouth daily as needed. Takes up to 2 tablets if needed, depending on severity of reflux   Past Month at Unknown time  . Elastic Bandages & Supports (COMFORT FIT MATERNITY SUPP SM) MISC Wear as directed. 1 each 0 Past Month at Unknown time  . valACYclovir (VALTREX) 1000 MG tablet Take 1 tablet (1,000 mg total) by mouth daily. 30 tablet 11 07/03/2018 at Unknown time  . cetirizine (ZYRTEC) 10 MG tablet Take 10 mg by mouth daily as needed for allergies.   More than a month at Unknown time  . Doxylamine-Pyridoxine (DICLEGIS) 10-10 MG TBEC 1 tab in AM, 1 tab mid afternoon 2 tabs at bedtime. Max dose 4 tabs daily. 100 tablet 5 More than a month at Unknown time    Review of Systems  Constitutional: Negative.  Negative for fatigue and fever.  HENT: Negative.   Respiratory: Negative.  Negative for shortness of breath.   Cardiovascular: Negative.  Negative for chest pain.  Gastrointestinal: Positive for abdominal pain. Negative for constipation, diarrhea, nausea and vomiting.   Genitourinary: Negative.  Negative for dysuria, vaginal bleeding and vaginal discharge.  Neurological: Negative.  Negative for dizziness and headaches.   Physical Exam   Blood pressure 123/77, pulse (!) 108, temperature 98.1 F (36.7 C), temperature source Oral, resp. rate 17, weight 71.8 kg, SpO2 100 %, unknown if currently breastfeeding.  Physical Exam  Nursing note and vitals reviewed. Constitutional: She is oriented to person, place, and time. She appears well-developed and well-nourished. No distress.  HENT:  Head: Normocephalic.  Eyes: Pupils are equal, round, and reactive to light.  Cardiovascular: Normal rate, regular rhythm and normal heart sounds.  Respiratory: Effort normal and breath sounds normal. No respiratory distress.  GI: Soft. Bowel sounds are normal. She exhibits no distension. There is no abdominal tenderness.  Neurological: She is alert and oriented to person, place, and time.  Skin: Skin is warm and dry.  Psychiatric: She has a normal mood and affect. Her behavior is normal. Judgment and thought content normal.   Dilation: 2 Exam by:: Cleone Slimaroline Jacinto Keil CNM  Fetal Tracing:  Baseline: 140 Variability: moderate Accels: 15x15 Decels: none  Toco: 4-6  MAU Course  Procedures Results for orders placed or performed during the hospital encounter of 07/04/18 (from the past 24 hour(s))  Urinalysis, Routine w reflex microscopic     Status: Abnormal   Collection Time: 07/04/18  6:00 PM  Result Value Ref Range   Color, Urine YELLOW YELLOW   APPearance HAZY (A) CLEAR   Specific Gravity, Urine 1.015 1.005 - 1.030   pH 6.5 5.0 - 8.0   Glucose, UA NEGATIVE NEGATIVE mg/dL   Hgb urine dipstick NEGATIVE NEGATIVE   Bilirubin Urine NEGATIVE NEGATIVE   Ketones, ur NEGATIVE NEGATIVE mg/dL   Protein, ur NEGATIVE NEGATIVE mg/dL   Nitrite NEGATIVE NEGATIVE   Leukocytes, UA TRACE (A) NEGATIVE  Urinalysis, Microscopic (reflex)     Status: Abnormal   Collection Time:  07/04/18  6:00 PM  Result Value Ref Range   RBC / HPF NONE SEEN 0 - 5 RBC/hpf   WBC, UA 0-5 0 - 5 WBC/hpf   Bacteria, UA FEW (A) NONE SEEN   Squamous Epithelial / LPF 6-10 0 - 5   MDM UA LR bolus Procardia PO x3 doses  Contractions initally every 4-6 minutes but spaced out to every 10-15 minutes.  Cervix unchanged  after 2 hours.   Assessment and Plan   1. Preterm uterine contractions in third trimester, antepartum   2. [redacted] weeks gestation of pregnancy    -Discharge home in stable condition -Rx for procardia given to patient  -Preterm labor precautions discussed -Patient advised to follow-up with [facility] on Thursday as scheduled -Patient may return to MAU as needed or if her condition were to change or worsen   Rolm Bookbinder CNM 07/04/2018, 6:14 PM

## 2018-07-04 NOTE — Discharge Instructions (Signed)
Braxton Hicks Contractions °Contractions of the uterus can occur throughout pregnancy, but they are not always a sign that you are in labor. You may have practice contractions called Braxton Hicks contractions. These false labor contractions are sometimes confused with true labor. °What are Braxton Hicks contractions? °Braxton Hicks contractions are tightening movements that occur in the muscles of the uterus before labor. Unlike true labor contractions, these contractions do not result in opening (dilation) and thinning of the cervix. Toward the end of pregnancy (32-34 weeks), Braxton Hicks contractions can happen more often and may become stronger. These contractions are sometimes difficult to tell apart from true labor because they can be very uncomfortable. You should not feel embarrassed if you go to the hospital with false labor. °Sometimes, the only way to tell if you are in true labor is for your health care provider to look for changes in the cervix. The health care provider will do a physical exam and may monitor your contractions. If you are not in true labor, the exam should show that your cervix is not dilating and your water has not broken. °If there are other health problems associated with your pregnancy, it is completely safe for you to be sent home with false labor. You may continue to have Braxton Hicks contractions until you go into true labor. °How to tell the difference between true labor and false labor °True labor °· Contractions last 30-70 seconds. °· Contractions become very regular. °· Discomfort is usually felt in the top of the uterus, and it spreads to the lower abdomen and low back. °· Contractions do not go away with walking. °· Contractions usually become more intense and increase in frequency. °· The cervix dilates and gets thinner. °False labor °· Contractions are usually shorter and not as strong as true labor contractions. °· Contractions are usually irregular. °· Contractions  are often felt in the front of the lower abdomen and in the groin. °· Contractions may go away when you walk around or change positions while lying down. °· Contractions get weaker and are shorter-lasting as time goes on. °· The cervix usually does not dilate or become thin. °Follow these instructions at home: °· Take over-the-counter and prescription medicines only as told by your health care provider. °· Keep up with your usual exercises and follow other instructions from your health care provider. °· Eat and drink lightly if you think you are going into labor. °· If Braxton Hicks contractions are making you uncomfortable: °? Change your position from lying down or resting to walking, or change from walking to resting. °? Sit and rest in a tub of warm water. °? Drink enough fluid to keep your urine pale yellow. Dehydration may cause these contractions. °? Do slow and deep breathing several times an hour. °· Keep all follow-up prenatal visits as told by your health care provider. This is important. °Contact a health care provider if: °· You have a fever. °· You have continuous pain in your abdomen. °Get help right away if: °· Your contractions become stronger, more regular, and closer together. °· You have fluid leaking or gushing from your vagina. °· You pass blood-tinged mucus (bloody show). °· You have bleeding from your vagina. °· You have low back pain that you never had before. °· You feel your baby’s head pushing down and causing pelvic pressure. °· Your baby is not moving inside you as much as it used to. °Summary °· Contractions that occur before labor are called Braxton   Hicks contractions, false labor, or practice contractions. °· Braxton Hicks contractions are usually shorter, weaker, farther apart, and less regular than true labor contractions. True labor contractions usually become progressively stronger and regular and they become more frequent. °· Manage discomfort from Braxton Hicks contractions by  changing position, resting in a warm bath, drinking plenty of water, or practicing deep breathing. °This information is not intended to replace advice given to you by your health care provider. Make sure you discuss any questions you have with your health care provider. °Document Released: 11/20/2016 Document Revised: 11/20/2016 Document Reviewed: 11/20/2016 °Elsevier Interactive Patient Education © 2018 [employer] ° °Safe Medications in Pregnancy  ° °Acne: °Benzoyl Peroxide °Salicylic Acid ° °Backache/Headache: °Tylenol: 2 regular strength every 4 hours OR °             2 Extra strength every 6 hours ° °Colds/Coughs/Allergies: °Benadryl (alcohol free) 25 mg every 6 hours as needed °Breath right strips °Claritin °Cepacol throat lozenges °Chloraseptic throat spray °Cold-Eeze- up to three times per day °Cough drops, alcohol free °Flonase (by prescription only) °Guaifenesin °Mucinex °Robitussin DM (plain only, alcohol free) °Saline nasal spray/drops °Sudafed (pseudoephedrine) & Actifed ** use only after [redacted] weeks gestation and if you do not have high blood pressure °Tylenol °Vicks Vaporub °Zinc lozenges °Zyrtec  ° °Constipation: °Colace °Ducolax suppositories °Fleet enema °Glycerin suppositories °Metamucil °Milk of magnesia °Miralax °Senokot °Smooth move tea ° °Diarrhea: °Kaopectate °Imodium A-D ° °*NO pepto Bismol ° °Hemorrhoids: °Anusol °Anusol HC °Preparation H °Tucks ° °Indigestion: °Tums °Maalox °Mylanta °Zantac  °Pepcid ° °Insomnia: °Benadryl (alcohol free) 25mg every 6 hours as needed °Tylenol PM °Unisom, no Gelcaps ° °Leg Cramps: °Tums °MagGel ° °Nausea/Vomiting:  °Bonine °Dramamine °Emetrol °Ginger extract °Sea bands °Meclizine  °Nausea medication to take during pregnancy:  °Unisom (doxylamine succinate 25 mg tablets) Take one tablet daily at bedtime. If symptoms are not adequately controlled, the dose can be increased to a maximum recommended dose of two tablets daily (1/2 tablet in the morning, 1/2 tablet  mid-afternoon and one at bedtime). °Vitamin B6 100mg tablets. Take one tablet twice a day (up to 200 mg per day). ° °Skin Rashes: °Aveeno products °Benadryl cream or 25mg every 6 hours as needed °Calamine Lotion °1% cortisone cream ° °Yeast infection: °Gyne-lotrimin 7 °Monistat 7 ° ° °**If taking multiple medications, please check labels to avoid duplicating the same active ingredients °**take medication as directed on the label °** Do not exceed 4000 mg of tylenol in 24 hours °**Do not take medications that contain aspirin or ibuprofen ° ° ° ° °

## 2018-07-04 NOTE — MAU Note (Addendum)
Andrea Dean is a 32 y.o. at 819w1d here in MAU reporting: +contractions Onset of complaint: ongoing occurrence; states has gotten stronger in the last 2-3 days  Pain score: 4/10. Lower back and abdomen. Cramping. Intermittent. States she is not on any medications currently for contractions. Vaginal bleeding: denies LOF: denies +vaginal pressure Vitals:   07/04/18 1759  BP: 123/77  Pulse: (!) 108  Resp: 17  Temp: 98.1 F (36.7 C)  SpO2: 100%     FHT:148 Lab orders placed from triage: ua

## 2018-07-11 ENCOUNTER — Ambulatory Visit (HOSPITAL_COMMUNITY)
Admission: EM | Admit: 2018-07-11 | Discharge: 2018-07-11 | Disposition: A | Discharge status: Home / Self Care | Payer: No Typology Code available for payment source | Attending: Internal Medicine | Admitting: Internal Medicine

## 2018-07-11 ENCOUNTER — Other Ambulatory Visit: Payer: Self-pay

## 2018-07-11 ENCOUNTER — Encounter (HOSPITAL_COMMUNITY): Payer: Self-pay

## 2018-07-11 DIAGNOSIS — H66003 Acute suppurative otitis media without spontaneous rupture of ear drum, bilateral: Secondary | ICD-10-CM | POA: Insufficient documentation

## 2018-07-11 MED ORDER — AMOXICILLIN 875 MG PO TABS: 875.0000 mg | ORAL_TABLET | Freq: Two times a day (BID) | ORAL | 0 refills | Status: AC

## 2018-07-11 MED ORDER — FLUCONAZOLE 150 MG PO TABS: 150.0000 mg | ORAL_TABLET | Freq: Once | ORAL | 0 refills | Status: AC

## 2018-07-11 MED ORDER — FLUTICASONE PROPIONATE 50 MCG/ACT NA SUSP: 2.0000 | Freq: Every day | NASAL | 0 refills | Status: DC

## 2018-07-11 NOTE — ED Triage Notes (Signed)
Pt cc has ear discomfort. Pt states she has taken a 1000 mg of tylenol about an 1 hour ago this morning.

## 2018-07-11 NOTE — ED Provider Notes (Signed)
[facility]    CSN: [id number] Arrival date & time: 07/11/18  1011     History   Chief Complaint Chief Complaint  Patient presents with  . Otalgia    HPI Andrea Dean is a 32 y.o. female.   32 year old female who is [redacted] weeks pregnant comes in for 2-day history of ear pain.  States for started on the right ear, and now with left ear pain as well.  Has also had muffled hearing.  Denies ear drainage.  Denies fever, chills, night sweats.  Does have mild postnasal drip that is normal for patient.  No obvious increase in rhinorrhea/nasal congestion.  Has been taking Tylenol 1000 mg to help with symptoms.  Denies recent swimming.     Past Medical History:  Diagnosis Date  . Abnormal Pap smear    repeat WNL  . Chlamydia   . Fracture closed, fibula, shaft 2007   left, no surgical repair  . GERD (gastroesophageal reflux disease)   . H/O seasonal allergies   . Herpes genitalis in women   . MRSA (methicillin resistant staph aureus) culture positive   . Ovarian cyst   . Preterm labor   . Thrombocytopenia (HCC)    2006 DURING PREGNANCY - NO PROBLEMS WITH DELIVERY OR ANY KNOWN PROBLEMS SINCE  . Urinary tract infection   . Urticaria     Patient Active Problem List   Diagnosis Date Noted  . Heart palpitations 05/04/2018  . Syncope 05/04/2018  . NST (non-stress test) nonreactive 10/15/2015  . Shortness of breath 05/21/2015  . Other allergic rhinitis 05/21/2015  . 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 Vaginitis and vulvovaginitis, unspecified 03/22/2014  . Pilonidal abscess 11/07/2013  . Pilonidal sinus without abscess 11/02/2013    Past Surgical History:  Procedure Laterality Date  . CESAREAN SECTION    . I & D OF PILONIADAL CYST IN OFFICE - LOCAL - FELT JITTERY AFTERWARDS    . PILONIDAL CYST EXCISION N/A 05/05/2014   Procedure:  EXCISION PILONIDAL CYST;  Surgeon: Romie Levee, MD;  Location: [facility];  Service: General;  Laterality: N/A;  . WISDOM TOOTH  EXTRACTION  2004    OB History    Gravida  8   Para  3   Term  3   Preterm      AB  4   Living  3     SAB  1   TAB  2   Ectopic  1   Multiple  0   Live Births  3            Home Medications    Prior to Admission medications   Medication Sig Start Date End Date Taking? Authorizing Provider  acetaminophen (TYLENOL) 500 MG tablet Take 500-1,000 mg by mouth daily as needed for moderate pain. Takes 1- 2 tablets as needed    [provider]  amoxicillin (AMOXIL) 875 MG tablet Take 1 tablet (875 mg total) by mouth 2 (two) times daily for 7 days. 07/11/18 07/18/18  Belinda Fisher, PA-C  Calcium Carbonate Antacid (TUMS PO) Take 1 tablet by mouth daily as needed. Takes up to 2 tablets if needed, depending on severity of reflux    [provider]  cetirizine (ZYRTEC) 10 MG tablet Take 10 mg by mouth daily as needed for allergies.    [provider]  Doxylamine-Pyridoxine (DICLEGIS) 10-10 MG TBEC 1 tab in AM, 1 tab mid afternoon 2 tabs at bedtime. Max dose 4 tabs daily. 12/29/17  Brock BadHarper, Charles A, MD  Elastic Bandages & Supports (COMFORT FIT MATERNITY SUPP SM) MISC Wear as directed. 05/05/18   Brock BadHarper, Charles A, MD  fluconazole (DIFLUCAN) 150 MG tablet Take 1 tablet (150 mg total) by mouth once for 1 dose. 07/11/18 07/11/18  Cathie HoopsYu, Greene Diodato V, PA-C  fluticasone (FLONASE) 50 MCG/ACT nasal spray Place 2 sprays into both nostrils daily. 07/11/18   Cathie HoopsYu, Teralyn Mullins V, PA-C  NIFEdipine (PROCARDIA) 10 MG capsule Take 1 capsule (10 mg total) by mouth 4 (four) times daily. 07/04/18 08/03/18  Rolm BookbinderNeill, Caroline M, CNM  valACYclovir (VALTREX) 1000 MG tablet Take 1 tablet (1,000 mg total) by mouth daily. 05/14/15   Brock BadHarper, Charles A, MD    Family History Family History  Problem Relation Age of Onset  . Cancer Paternal Grandmother        brain, lung  . Diabetes Paternal Grandmother   . Heart disease Paternal Grandmother   . Hypertension Paternal Grandmother   . Hypertension  Maternal Grandmother   . Stroke Maternal Grandmother   . Anesthesia problems Neg Hx   . Hypotension Neg Hx   . Malignant hyperthermia Neg Hx   . Pseudochol deficiency Neg Hx   . Allergic rhinitis Neg Hx   . Asthma Neg Hx   . Angioedema Neg Hx   . Eczema Neg Hx   . Immunodeficiency Neg Hx   . Urticaria Neg Hx     Social History Social History   Tobacco Use  . Smoking status: Never Smoker  . Smokeless tobacco: Never Used  Substance Use Topics  . Alcohol use: No    Alcohol/week: 0.0 standard drinks    Comment: occ  . Drug use: No     Allergies   Flagyl [metronidazole hcl] and Latex   Review of Systems Review of Systems  Reason unable to perform ROS: See HPI as above.     Physical Exam Triage Vital Signs ED Triage Vitals  Enc Vitals Group     BP 07/11/18 1027 111/73     Pulse Rate 07/11/18 1027 98     Resp 07/11/18 1027 16     Temp 07/11/18 1027 98.3 F (36.8 C)     Temp Source 07/11/18 1027 Oral     SpO2 07/11/18 1027 100 %     Weight 07/11/18 1026 157 lb (71.2 kg)     Height --      Head Circumference --      Peak Flow --      Pain Score 07/11/18 1026 3     Pain Loc --      Pain Edu? --      Excl. in GC? --    No data found.  Updated Vital Signs BP 111/73 (BP Location: Right Arm)   Pulse 98   Temp 98.3 F (36.8 C) (Oral)   Resp 16   Wt 157 lb (71.2 kg)   SpO2 100%   BMI 26.95 kg/m   Physical Exam Constitutional:      General: She is not in acute distress.    Appearance: She is well-developed. She is not ill-appearing, toxic-appearing or diaphoretic.  HENT:     Head: Normocephalic and atraumatic.     Right Ear: Ear canal and external ear normal. Tympanic membrane is erythematous. Tympanic membrane is not bulging.     Left Ear: Ear canal and external ear normal. Tympanic membrane is erythematous. Tympanic membrane is not bulging.     Nose: Nose normal.     Right  Sinus: No maxillary sinus tenderness or frontal sinus tenderness.     Left  Sinus: No maxillary sinus tenderness or frontal sinus tenderness.     Mouth/Throat:     Pharynx: Uvula midline.  Eyes:     Conjunctiva/sclera: Conjunctivae normal.     Pupils: Pupils are equal, round, and reactive to light.  Neck:     Musculoskeletal: Normal range of motion and neck supple.  Cardiovascular:     Rate and Rhythm: Normal rate and regular rhythm.     Heart sounds: Normal heart sounds. No murmur. No friction rub. No gallop.   Pulmonary:     Effort: Pulmonary effort is normal.     Breath sounds: Normal breath sounds. No decreased breath sounds, wheezing, rhonchi or rales.  Lymphadenopathy:     Cervical: No cervical adenopathy.  Skin:    General: Skin is warm and dry.  Neurological:     Mental Status: She is alert and oriented to person, place, and time.  Psychiatric:        Behavior: Behavior normal.        Judgment: Judgment normal.      UC Treatments / Results  Labs (all labs ordered are listed, but only abnormal results are displayed) Labs Reviewed - No data to display  EKG None  Radiology No results found.  Procedures Procedures (including critical care time)  Medications Ordered in UC Medications - No data to display  Initial Impression / Assessment and Plan / UC Course  I have reviewed the triage vital signs and the nursing notes.  Pertinent labs & imaging results that were available during my care of the patient were reviewed by me and considered in my medical decision making (see chart for details).    Will start amoxicillin for otitis media. Discussed possible eustachian tube dysfunction causing symptoms as well, flonase as directed. Patient requesting diflucan with antibiotic. Risks and benefits discussed. Return precautions given.  Final Clinical Impressions(s) / UC Diagnoses   Final diagnoses:  Non-recurrent acute suppurative otitis media of both ears without spontaneous rupture of tympanic membranes    ED Prescriptions    Medication  Sig Dispense Auth. Provider   amoxicillin (AMOXIL) 875 MG tablet Take 1 tablet (875 mg total) by mouth 2 (two) times daily for 7 days. 14 tablet Gildardo Tickner V, PA-C   fluticasone (FLONASE) 50 MCG/ACT nasal spray Place 2 sprays into both nostrils daily. 1 g Antonin Meininger V, PA-C   fluconazole (DIFLUCAN) 150 MG tablet Take 1 tablet (150 mg total) by mouth once for 1 dose. 1 tablet Threasa AlphaYu, Wilmont Olund V, PA-C        Carmel Garfield V, [place]PA-C 07/11/18 1109

## 2018-07-11 NOTE — Discharge Instructions (Signed)
Start amoxicillin as directed. As discussed, symptoms could also be due to eustachian tube dysfunction. Start flonase as directed, please continue to use for the next 1-2 weeks to help prevent repeat of symptoms.

## 2018-07-15 MED FILL — valACYclovir HCL 1 GM TABS: 1 | 30 days supply | Qty: 30 | Fill #1

## 2018-07-20 LAB — OB RESULTS CONSOLE GBS: GBS: POSITIVE

## 2018-07-21 NOTE — L&D Delivery Note (Signed)
Delivery Note At 11:49 AM a viable female was delivered via VBAC, Spontaneous (Presentation: ROA).  APGAR: 9,9 ; weight: pending  .   Placenta status: L&D.  Cord:  with the following complications: none.  Cord pH: n/a  Anesthesia:  epidural Episiotomy:  none Lacerations:  n/a Suture Repair: n/a Est. Blood Loss (mL):  100cc  Mom to postpartum.  Baby to Couplet care / Skin to Skin.  Sharon Seller 08/10/2018, 12:42 PM

## 2018-08-03 ENCOUNTER — Encounter (HOSPITAL_COMMUNITY): Payer: Self-pay | Admitting: *Deleted

## 2018-08-03 ENCOUNTER — Inpatient Hospital Stay (HOSPITAL_COMMUNITY)
Admission: AD | Admit: 2018-08-03 | Discharge: 2018-08-03 | Disposition: A | Discharge status: Home / Self Care | Payer: No Typology Code available for payment source | Attending: Obstetrics & Gynecology | Admitting: Obstetrics & Gynecology

## 2018-08-03 ENCOUNTER — Inpatient Hospital Stay (HOSPITAL_BASED_OUTPATIENT_CLINIC_OR_DEPARTMENT_OTHER): Payer: No Typology Code available for payment source

## 2018-08-03 ENCOUNTER — Other Ambulatory Visit: Payer: Self-pay

## 2018-08-03 DIAGNOSIS — O26893 Other specified pregnancy related conditions, third trimester: Secondary | ICD-10-CM | POA: Insufficient documentation

## 2018-08-03 DIAGNOSIS — O368390 Maternal care for abnormalities of the fetal heart rate or rhythm, unspecified trimester, not applicable or unspecified: Secondary | ICD-10-CM | POA: Diagnosis not present

## 2018-08-03 DIAGNOSIS — Z3A37 37 weeks gestation of pregnancy: Secondary | ICD-10-CM

## 2018-08-03 DIAGNOSIS — G44209 Tension-type headache, unspecified, not intractable: Secondary | ICD-10-CM | POA: Diagnosis not present

## 2018-08-03 DIAGNOSIS — R51 Headache: Secondary | ICD-10-CM | POA: Insufficient documentation

## 2018-08-03 DIAGNOSIS — O368330 Maternal care for abnormalities of the fetal heart rate or rhythm, third trimester, not applicable or unspecified: Secondary | ICD-10-CM

## 2018-08-03 LAB — URINALYSIS, ROUTINE W REFLEX MICROSCOPIC
Bilirubin Urine: NEGATIVE
Glucose, UA: NEGATIVE mg/dL
Hgb urine dipstick: NEGATIVE
Ketones, ur: NEGATIVE mg/dL
Leukocytes, UA: NEGATIVE
Nitrite: NEGATIVE
Protein, ur: NEGATIVE mg/dL
Specific Gravity, Urine: 1.003 — ABNORMAL LOW (ref 1.005–1.030)
pH: 6 (ref 5.0–8.0)

## 2018-08-03 LAB — COMPREHENSIVE METABOLIC PANEL
ALT: 11 U/L (ref 0–44)
AST: 20 U/L (ref 15–41)
Albumin: 3.1 g/dL — ABNORMAL LOW (ref 3.5–5.0)
Alkaline Phosphatase: 95 U/L (ref 38–126)
Anion gap: 7 (ref 5–15)
BUN: 5 mg/dL — ABNORMAL LOW (ref 6–20)
CO2: 24 mmol/L (ref 22–32)
Calcium: 9.1 mg/dL (ref 8.9–10.3)
Chloride: 105 mmol/L (ref 98–111)
Creatinine, Ser: 0.57 mg/dL (ref 0.44–1.00)
GFR calc Af Amer: >60 mL/min (ref >60)
GFR calc non Af Amer: >60 mL/min (ref >60)
Glucose, Bld: 83 mg/dL (ref 70–99)
Potassium: 3.7 mmol/L (ref 3.5–5.1)
Sodium: 136 mmol/L (ref 135–145)
Total Bilirubin: 0.5 mg/dL (ref 0.3–1.2)
Total Protein: 7.1 g/dL (ref 6.5–8.1)

## 2018-08-03 LAB — CBC WITH DIFFERENTIAL/PLATELET
Basophils Absolute: 0 10*3/uL (ref 0.0–0.1)
Basophils Relative: 0 %
Eosinophils Absolute: 0.1 10*3/uL (ref 0.0–0.5)
Eosinophils Relative: 1 %
HCT: 33.8 % — ABNORMAL LOW (ref 36.0–46.0)
Hemoglobin: 10.6 g/dL — ABNORMAL LOW (ref 12.0–15.0)
Lymphocytes Relative: 25 %
Lymphs Abs: 2.5 10*3/uL (ref 0.7–4.0)
MCH: 28.6 pg (ref 26.0–34.0)
MCHC: 31.4 g/dL (ref 30.0–36.0)
MCV: 91.1 fL (ref 80.0–100.0)
Monocytes Absolute: 0.9 10*3/uL (ref 0.1–1.0)
Monocytes Relative: 9 %
Neutro Abs: 6.5 10*3/uL (ref 1.7–7.7)
Neutrophils Relative %: 65 %
Platelets: 277 10*3/uL (ref 150–400)
RBC: 3.71 MIL/uL — ABNORMAL LOW (ref 3.87–5.11)
RDW: 14.1 % (ref 11.5–15.5)
WBC: 10 10*3/uL (ref 4.0–10.5)
nRBC: 0.6 % — ABNORMAL HIGH (ref 0.0–0.2)

## 2018-08-03 LAB — PROTEIN / CREATININE RATIO, URINE
Creatinine, Urine: 26 mg/dL
Total Protein, Urine: <6 mg/dL

## 2018-08-03 IMAGING — US US MFM OB LIMITED
1 series · 15 of 21 positions shown · non-contrast
Comparison: none

[Series 1: us mfm ob limited · 21 acquisitions, 15 frames shown]
[im 1/21]
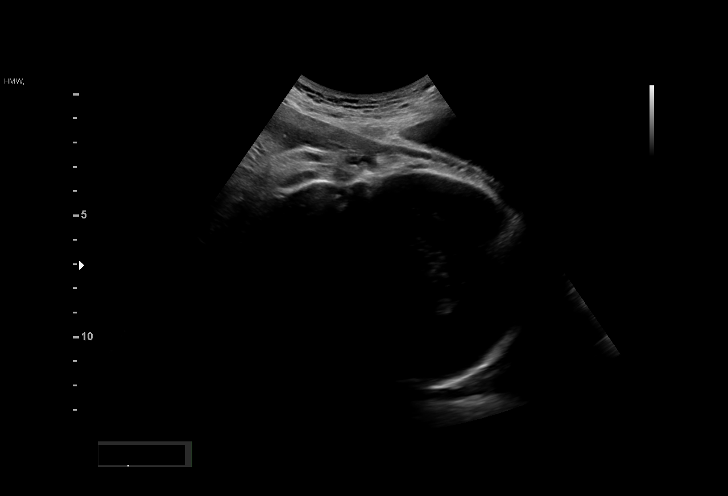
[im 3/21]
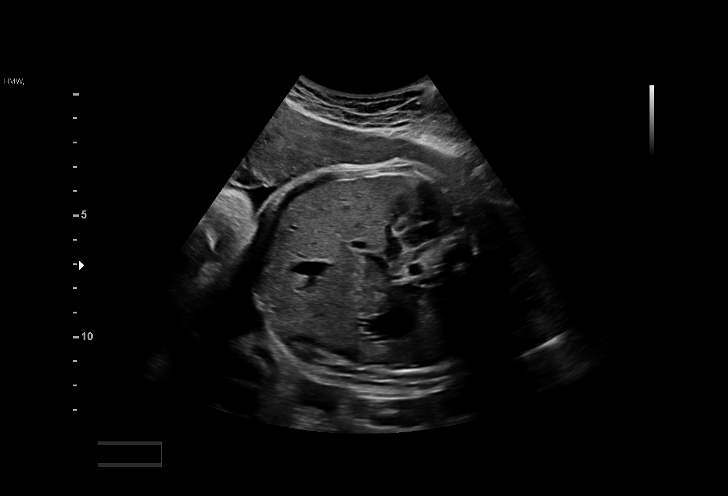
[im 4/21]
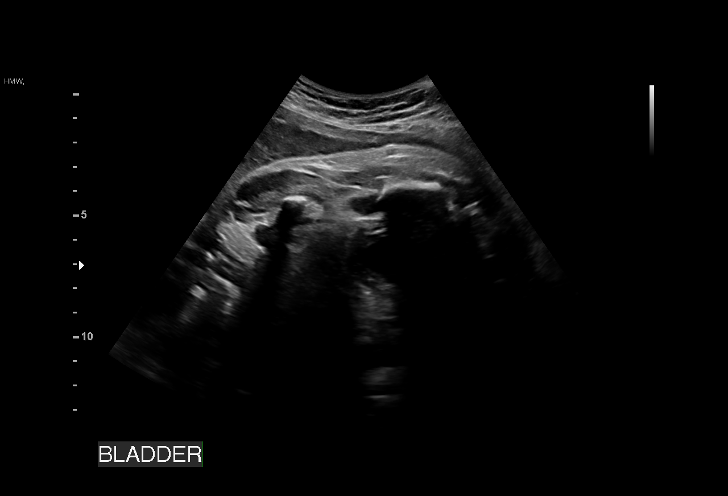
[im 5/21]
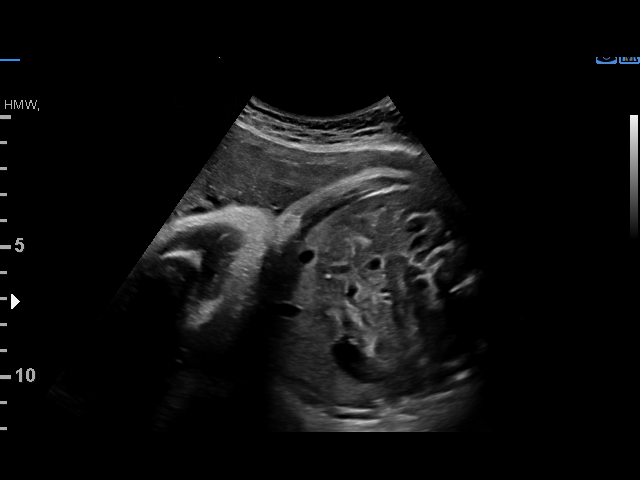
[im 7/21]
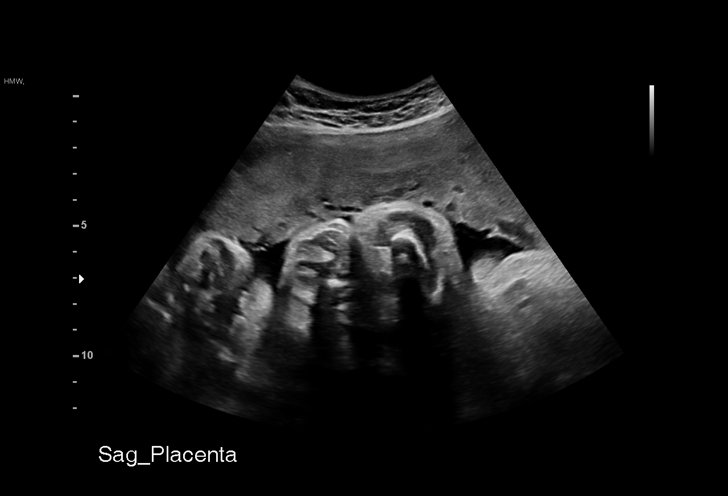
[im 8/21]
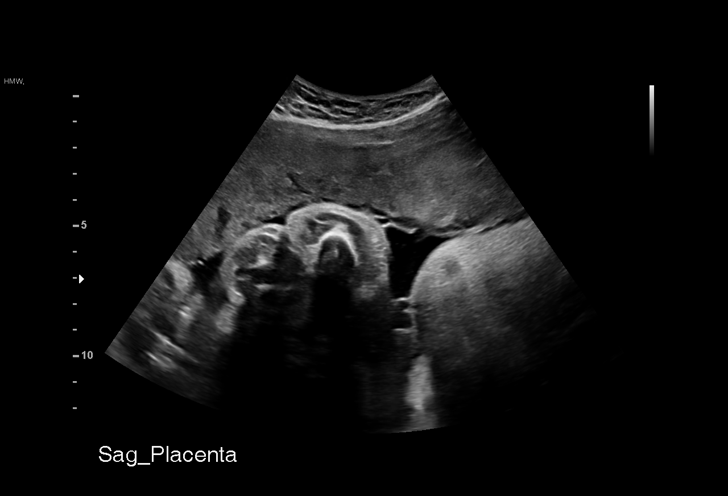
[im 10/21]
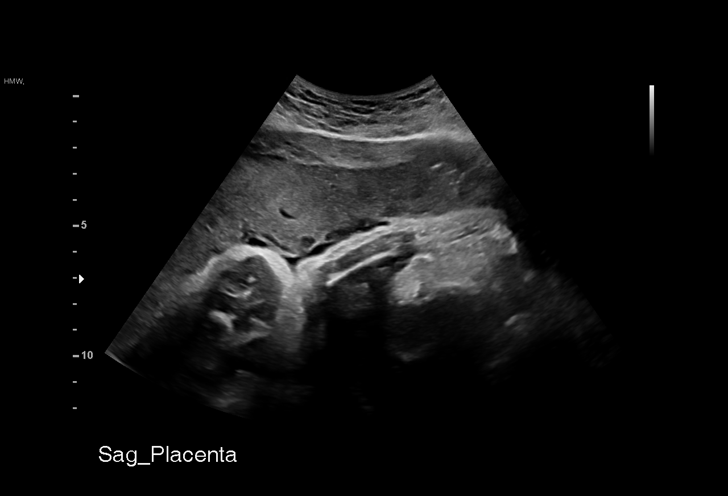
[im 11/21]
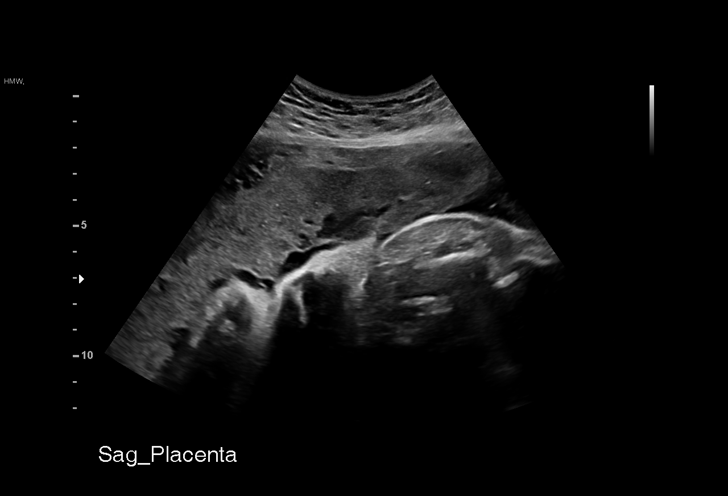
[im 12/21]
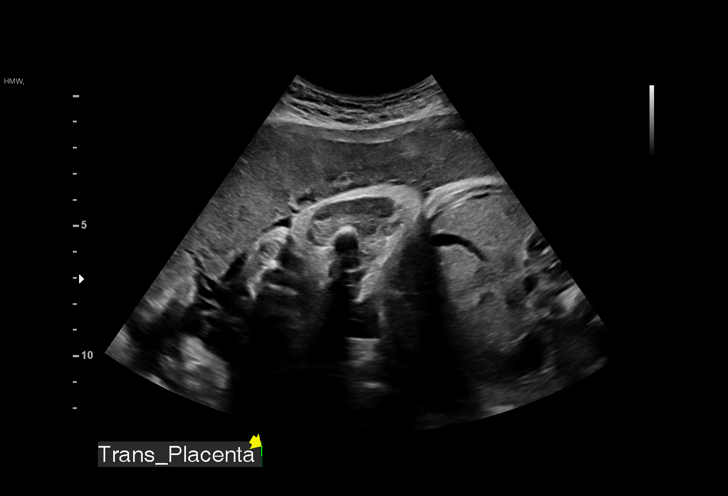
[im 14/21]
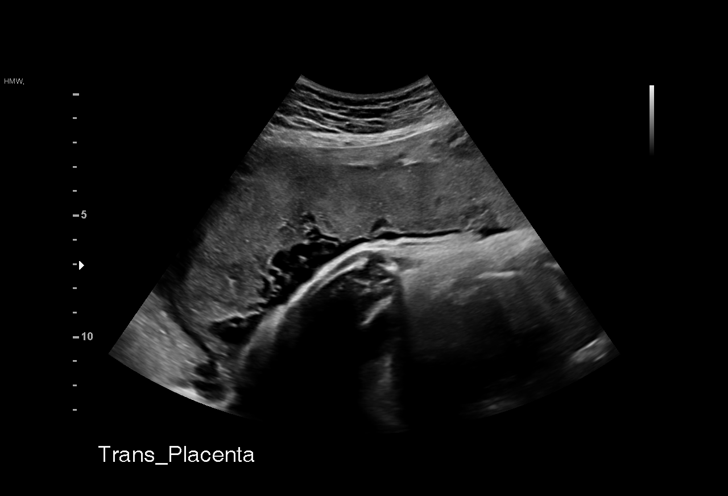
[im 15/21]
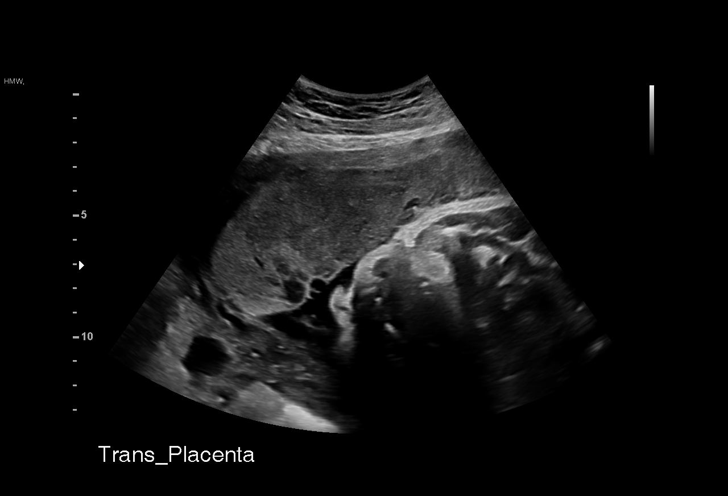
[im 17/21]
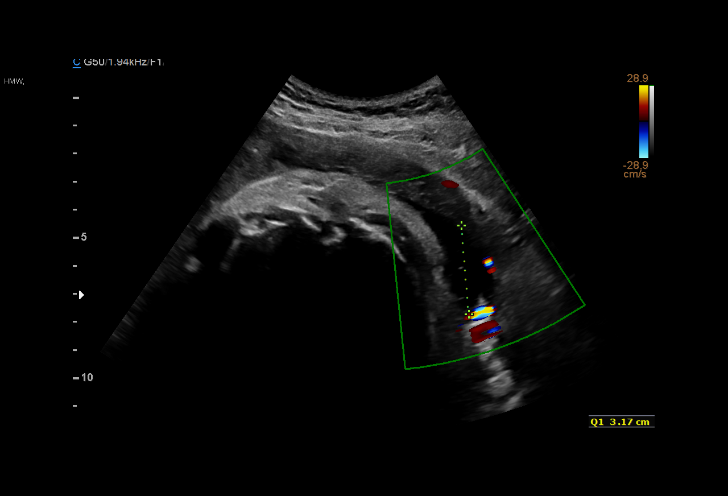
[im 18/21]
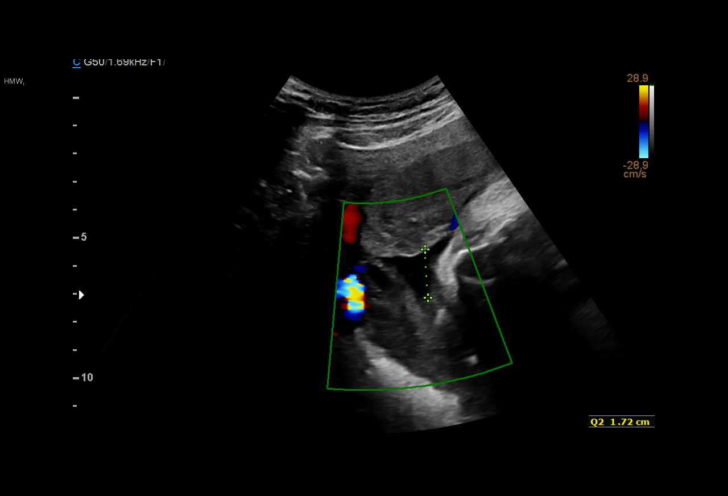
[im 19/21]
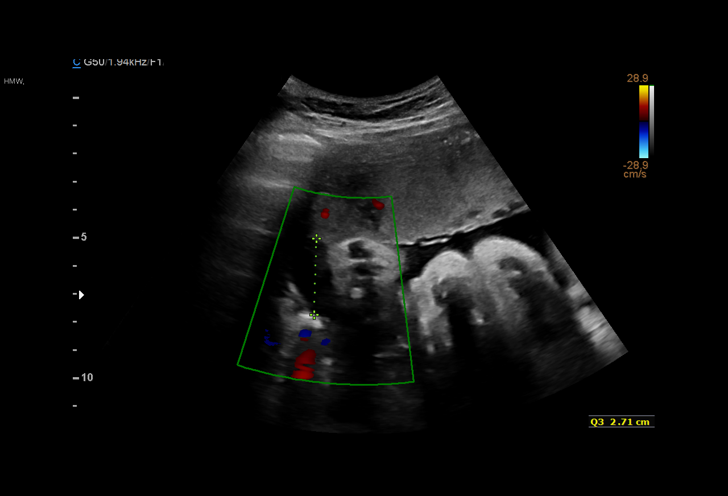
[im 21/21]
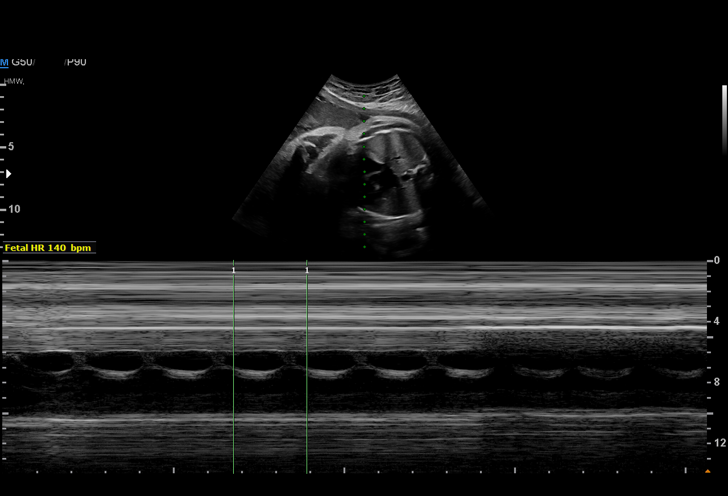

[15 of 21 positions shown; findings below may reference images not displayed]

CNM

  1  US MFM OB LIMITED                     76815.01    [place]
 ----------------------------------------------------------------------

 ----------------------------------------------------------------------
Indications

  37 weeks gestation of pregnancy
  Fetal heart rate decelerations affecting        [8I]
  management of mother
 ----------------------------------------------------------------------
Fetal Evaluation

 Num Of Fetuses:          1
 Fetal Heart              140
 Rate(bpm):
 Cardiac Activity:        Observed
 Presentation:            Cephalic
 Placenta:                Anterior
 P. Cord Insertion:       Visualized, central

 Amniotic Fluid
 AFI FV:      Within normal limits

 AFI Sum(cm)     %Tile       Largest Pocket(cm)
 7.85            8

 RUQ(cm)       RLQ(cm)       LUQ(cm)        LLQ(cm)
 3.17          0
OB History

 Gravidity:    8         Term:   3        Prem:   0         SAB:   1
 TOP:          2       Ectopic:  1        Living: 3
Gestational Age
 Clinical EDD:  37w 3d                                        EDD:    [DATE]
 Best:          37w 3d     Det. By:  Clinical EDD             EDD:    [DATE]
Cervix Uterus Adnexa

 Cervix
 Not visualized (advanced GA >[8I])
Impression

 Normal amniotic fluid observed
Recommendations

 Follow up as clincally indicated.

## 2018-08-03 NOTE — MAU Provider Note (Signed)
History     CSN: [id number]  Arrival date and time: 08/03/18 1320   First Provider Initiated Contact with Patient 08/03/18 1408      Chief Complaint  Patient presents with  . Headache  . visual changes  . Hypertension   HPI [name] is a 33 y.o. [phone] at 344w3d who presents to MAU today with complaint of floaters since Saturday and HA off and on since Sunday. Headache is mild now. She has not taken anything for pain. She denies abdominal pain. She is having mild LE edema. She reports normal fetal movement and irregular contractions. She denies vaginal bleeding or LOF.   OB History    Gravida  8   Para  3   Term  3   Preterm      AB  4   Living  3     SAB  1   TAB  2   Ectopic  1   Multiple  0   Live Births  3           Past Medical History:  Diagnosis Date  . Abnormal Pap smear    repeat WNL  . Chlamydia   . Fracture closed, fibula, shaft 2007   left, no surgical repair  . GERD (gastroesophageal reflux disease)   . H/O seasonal allergies   . Herpes genitalis in women   . MRSA (methicillin resistant staph aureus) culture positive   . Ovarian cyst   . Preterm labor   . Thrombocytopenia (HCC)    2006 DURING PREGNANCY - NO PROBLEMS WITH DELIVERY OR ANY KNOWN PROBLEMS SINCE  . Urinary tract infection   . Urticaria     Past Surgical History:  Procedure Laterality Date  . CESAREAN SECTION    . I & D OF PILONIADAL CYST IN OFFICE - LOCAL - FELT JITTERY AFTERWARDS    . PILONIDAL CYST EXCISION N/A 05/05/2014   Procedure:  EXCISION PILONIDAL CYST;  Surgeon: Romie LeveeAlicia Thomas, MD;  Location: [facility];  Service: General;  Laterality: N/A;  . WISDOM TOOTH EXTRACTION  2004    Family History  Problem Relation Age of Onset  . Cancer Paternal Grandmother        brain, lung  . Diabetes Paternal Grandmother   . Heart disease Paternal Grandmother   . Hypertension Paternal Grandmother   . Hypertension Maternal Grandmother   . Stroke Maternal Grandmother    . Anesthesia problems Neg Hx   . Hypotension Neg Hx   . Malignant hyperthermia Neg Hx   . Pseudochol deficiency Neg Hx   . Allergic rhinitis Neg Hx   . Asthma Neg Hx   . Angioedema Neg Hx   . Eczema Neg Hx   . Immunodeficiency Neg Hx   . Urticaria Neg Hx     Social History   Tobacco Use  . Smoking status: Never Smoker  . Smokeless tobacco: Never Used  Substance Use Topics  . Alcohol use: No    Alcohol/week: 0.0 standard drinks    Comment: occ  . Drug use: No    Allergies:  Allergies  Allergen Reactions  . Flagyl [Metronidazole Hcl] Hives and Nausea And Vomiting  . Latex     sensitive    Medications Prior to Admission  Medication Sig Dispense Refill Last Dose  . valACYclovir (VALTREX) 500 MG tablet Take 1,000 mg by mouth 2 (two) times daily.     Marland Kitchen. acetaminophen (TYLENOL) 500 MG tablet Take 500-1,000 mg by mouth daily as  needed for moderate pain. Takes 1- 2 tablets as needed   Past Month at Unknown time  . Calcium Carbonate Antacid (TUMS PO) Take 1 tablet by mouth daily as needed. Takes up to 2 tablets if needed, depending on severity of reflux   Past Month at Unknown time  . cetirizine (ZYRTEC) 10 MG tablet Take 10 mg by mouth daily as needed for allergies.   More than a month at Unknown time  . Doxylamine-Pyridoxine (DICLEGIS) 10-10 MG TBEC 1 tab in AM, 1 tab mid afternoon 2 tabs at bedtime. Max dose 4 tabs daily. 100 tablet 5 More than a month at Unknown time  . Elastic Bandages & Supports (COMFORT FIT MATERNITY SUPP SM) MISC Wear as directed. 1 each 0 Past Month at Unknown time  . fluticasone (FLONASE) 50 MCG/ACT nasal spray Place 2 sprays into both nostrils daily. 1 g 0   . NIFEdipine (PROCARDIA) 10 MG capsule Take 1 capsule (10 mg total) by mouth 4 (four) times daily. 120 capsule 0   . valACYclovir (VALTREX) 1000 MG tablet Take 1 tablet (1,000 mg total) by mouth daily. 30 tablet 11 07/03/2018 at Unknown time    Review of Systems  Constitutional: Negative for fever.   Eyes: Positive for visual disturbance.  Cardiovascular: Positive for leg swelling.  Gastrointestinal: Negative for abdominal pain, constipation, diarrhea, nausea and vomiting.  Genitourinary: Negative for vaginal bleeding and vaginal discharge.  Neurological: Positive for headaches.   Physical Exam   Blood pressure 131/77, pulse 96, resp. rate 16, weight 73.9 kg, unknown if currently breastfeeding.  Physical Exam  Nursing note and vitals reviewed. Constitutional: She is oriented to person, place, and time. She appears well-developed and well-nourished. No distress.  HENT:  Head: Normocephalic and atraumatic.  Cardiovascular: Normal rate.  Respiratory: Effort normal.  GI: Soft. She exhibits no distension and no mass. There is no abdominal tenderness. There is no rebound and no guarding.  Musculoskeletal:        General: Edema (mild non pitting edema) present.  Neurological: She is alert and oriented to person, place, and time. She has normal reflexes.  Skin: Skin is warm and dry. No erythema.  Psychiatric: She has a normal mood and affect.   Cervical exam:  Dilation: 1 Effacement (%): Thick Cervical Position: Middle Exam by:: ginger morris rn   Results for orders placed or performed during the hospital encounter of 08/03/18 (from the past 24 hour(s))  Protein / creatinine ratio, urine     Status: None   Collection Time: 08/03/18  1:49 PM  Result Value Ref Range   Creatinine, Urine 26.00 mg/dL   Total Protein, Urine <6 mg/dL   Protein Creatinine Ratio        0.00 - 0.15 mg/mg[Cre]  Urinalysis, Routine w reflex microscopic     Status: Abnormal   Collection Time: 08/03/18  1:49 PM  Result Value Ref Range   Color, Urine STRAW (A) YELLOW   APPearance HAZY (A) CLEAR   Specific Gravity, Urine 1.003 (L) 1.005 - 1.030   pH 6.0 5.0 - 8.0   Glucose, UA NEGATIVE NEGATIVE mg/dL   Hgb urine dipstick NEGATIVE NEGATIVE   Bilirubin Urine NEGATIVE NEGATIVE   Ketones, ur NEGATIVE  NEGATIVE mg/dL   Protein, ur NEGATIVE NEGATIVE mg/dL   Nitrite NEGATIVE NEGATIVE   Leukocytes, UA NEGATIVE NEGATIVE  CBC with Differential/Platelet     Status: Abnormal   Collection Time: 08/03/18  1:54 PM  Result Value Ref Range   WBC 10.0  4.0 - 10.5 K/uL   RBC 3.71 (L) 3.87 - 5.11 MIL/uL   Hemoglobin 10.6 (L) 12.0 - 15.0 g/dL   HCT 95.0 (L) 93.2 - 67.1 %   MCV 91.1 80.0 - 100.0 fL   MCH 28.6 26.0 - 34.0 pg   MCHC 31.4 30.0 - 36.0 g/dL   RDW 24.5 80.9 - 98.3 %   Platelets 277 150 - 400 K/uL   nRBC 0.6 (H) 0.0 - 0.2 %   Neutrophils Relative % 65 %   Neutro Abs 6.5 1.7 - 7.7 K/uL   Lymphocytes Relative 25 %   Lymphs Abs 2.5 0.7 - 4.0 K/uL   Monocytes Relative 9 %   Monocytes Absolute 0.9 0.1 - 1.0 K/uL   Eosinophils Relative 1 %   Eosinophils Absolute 0.1 0.0 - 0.5 K/uL   Basophils Relative 0 %   Basophils Absolute 0.0 0.0 - 0.1 K/uL  Comprehensive metabolic panel     Status: Abnormal   Collection Time: 08/03/18  1:54 PM  Result Value Ref Range   Sodium 136 135 - 145 mmol/L   Potassium 3.7 3.5 - 5.1 mmol/L   Chloride 105 98 - 111 mmol/L   CO2 24 22 - 32 mmol/L   Glucose, Bld 83 70 - 99 mg/dL   BUN 5 (L) 6 - 20 mg/dL   Creatinine, Ser 3.82 0.44 - 1.00 mg/dL   Calcium 9.1 8.9 - 50.5 mg/dL   Total Protein 7.1 6.5 - 8.1 g/dL   Albumin 3.1 (L) 3.5 - 5.0 g/dL   AST 20 15 - 41 U/L   ALT 11 0 - 44 U/L   Alkaline Phosphatase 95 38 - 126 U/L   Total Bilirubin 0.5 0.3 - 1.2 mg/dL   GFR calc non Af Amer >60 >60 mL/min   GFR calc Af Amer >60 >60 mL/min   Anion gap 7 5 - 15   Fetal Monitoring: Baseline: 140 bpm Variability: moderate Accelerations: 15 x 15 Decelerations: few variable Contractions: few, irregular   MAU Course  Procedures None  MDM CBC, CMP, UA and Urine protein/creatinine ratio Serial BPs - All normal Dr. Debroah Loop reviewed NST. Recommends AFI today due to few variable decelerations. Overall NST is reassuring. AFI is within normal limits. Dr. Debroah Loop  informed, ok for discharge to follow-up tomorrow as scheduled.   Assessment and Plan  A: [facility] at [redacted]w[redacted]d Headache, transient, mild Normotensive   P:  Discharge home Tylenol PRN for headache  Pre-eclampsia precautions discussed Patient advised to follow-up with [facility] tomorrow as scheduled Patient may return to MAU as needed or if her condition were to change or worsen   Vonzella Nipple, PA-C 08/03/2018, 4:27 PM

## 2018-08-03 NOTE — Discharge Instructions (Signed)
.  Preeclampsia and Eclampsia    Preeclampsia is a serious condition that may develop during pregnancy. It is also called toxemia of pregnancy. This condition causes high blood pressure along with other symptoms, such as swelling and headaches. These symptoms may develop as the condition gets worse. Preeclampsia may occur at 20 weeks of pregnancy or later.  Diagnosing and treating preeclampsia early is very important. If not treated early, it can cause serious problems for you and your baby. One problem it can lead to is eclampsia. Eclampsia is a condition that causes muscle jerking or shaking (convulsions or seizures) and other serious problems for the mother. During pregnancy, delivering your baby may be the best treatment for preeclampsia or eclampsia. For most women, preeclampsia and eclampsia symptoms go away after giving birth.  In rare cases, a woman may develop preeclampsia after giving birth (postpartum preeclampsia). This usually occurs within 48 hours after childbirth but may occur up to 6 weeks after giving birth.  What are the causes?  The cause of preeclampsia is not known.  What increases the risk?  The following risk factors make you more likely to develop preeclampsia:   Being pregnant for the first time.   Having had preeclampsia during a past pregnancy.   Having a family history of preeclampsia.   Having high blood pressure.   Being pregnant with more than one baby.   Being 35 or older.   Being African-American.   Having kidney disease or diabetes.   Having medical conditions such as lupus or blood diseases.   Being very overweight (obese).  What are the signs or symptoms?  The earliest signs of preeclampsia are:   High blood pressure.   Increased protein in your urine. Your health care provider will check for this at every visit before you give birth (prenatal visit).  Other symptoms that may develop as the condition gets worse include:   Severe headaches.   Sudden weight  gain.   Swelling of the hands, face, legs, and feet.   Nausea and vomiting.   Vision problems, such as blurred or double vision.   Numbness in the face, arms, legs, and feet.   Urinating less than usual.   Dizziness.   Slurred speech.   Abdominal pain, especially upper abdominal pain.   Convulsions or seizures.  How is this diagnosed?  There are no screening tests for preeclampsia. Your health care provider will ask you about symptoms and check for signs of preeclampsia during your prenatal visits. You may also have tests that include:   Urine tests.   Blood tests.   Checking your blood pressure.   Monitoring your baby's heart rate.   Ultrasound.  How is this treated?  You and your health care provider will determine the treatment approach that is best for you. Treatment may include:   Having more frequent prenatal exams to check for signs of preeclampsia, if you have an increased risk for preeclampsia.   Medicine to lower your blood pressure.   Staying in the hospital, if your condition is severe. There, treatment will focus on controlling your blood pressure and the amount of fluids in your body (fluid retention).   Taking medicine (magnesium sulfate) to prevent seizures. This may be given as an injection or through an IV.   Taking a low-dose aspirin during your pregnancy.   Delivering your baby early, if your condition gets worse. You may have your labor started with medicine (induced), or you may have a cesarean   delivery.  Follow these instructions at home:  Eating and drinking     Drink enough fluid to keep your urine pale yellow.   Avoid caffeine.  Lifestyle   Do not use any products that contain nicotine or tobacco, such as cigarettes and e-cigarettes. If you need help quitting, ask your health care provider.   Do not use alcohol or drugs.   Avoid stress as much as possible. Rest and get plenty of sleep.  General instructions   Take over-the-counter and prescription medicines only as  told by your health care provider.   When lying down, lie on your left side. This keeps pressure off your major blood vessels.   When sitting or lying down, raise (elevate) your feet. Try putting some pillows underneath your lower legs.   Exercise regularly. Ask your health care provider what kinds of exercise are best for you.   Keep all follow-up and prenatal visits as told by your health care provider. This is important.  How is this prevented?  There is no known way of preventing preeclampsia or eclampsia from developing. However, to lower your risk of complications and detect problems early:   Get regular prenatal care. Your health care provider may be able to diagnose and treat the condition early.   Maintain a healthy weight. Ask your health care provider for help managing weight gain during pregnancy.   Work with your health care provider to manage any long-term (chronic) health conditions you have, such as diabetes or kidney problems.   You may have tests of your blood pressure and kidney function after giving birth.   Your health care provider may have you take low-dose aspirin during your next pregnancy.  Contact a health care provider if:   You have symptoms that your health care provider told you may require more treatment or monitoring, such as:  ? Headaches.  ? Nausea or vomiting.  ? Abdominal pain.  ? Dizziness.  ? Light-headedness.  Get help right away if:   You have severe:  ? Abdominal pain.  ? Headaches that do not get better.  ? Dizziness.  ? Vision problems.  ? Confusion.  ? Nausea or vomiting.   You have any of the following:  ? A seizure.  ? Sudden, rapid weight gain.  ? Sudden swelling in your hands, ankles, or face.  ? Trouble moving any part of your body.  ? Numbness in any part of your body.  ? Trouble speaking.  ? Abnormal bleeding.   You faint.  Summary   Preeclampsia is a serious condition that may develop during pregnancy. It is also called toxemia of pregnancy.   This  condition causes high blood pressure along with other symptoms, such as swelling and headaches.   Diagnosing and treating preeclampsia early is very important. If not treated early, it can cause serious problems for you and your baby.   Get help right away if you have symptoms that your health care provider told you to watch for.  This information is not intended to replace advice given to you by your health care provider. Make sure you discuss any questions you have with your health care provider.  Document Released: 07/04/2000 Document Revised: 06/23/2017 Document Reviewed: 02/11/2016  [employer]vier Interactive Patient Education  2019 [employer]

## 2018-08-03 NOTE — MAU Note (Signed)
Pt presents to MAU with complaints of blurred vision on Saturday and started having a headache and swelling on Sunday. Was told to come in and be evaluated after she was called back from the office

## 2018-08-04 MED FILL — VALACYCLOVIR HCL 500 MG TAB: 500 | 30 days supply | Qty: 60 | Fill #0

## 2018-08-04 MED FILL — TERCONAZOLE 0.8% VAGINAL CR: 0.8 | 3 days supply | Qty: 20 | Fill #0

## 2018-08-09 ENCOUNTER — Encounter (HOSPITAL_COMMUNITY): Payer: Self-pay | Admitting: *Deleted

## 2018-08-09 ENCOUNTER — Inpatient Hospital Stay (EMERGENCY_DEPARTMENT_HOSPITAL)
Admission: AD | Admit: 2018-08-09 | Discharge: 2018-08-10 | Disposition: A | Discharge status: Home / Self Care | Payer: No Typology Code available for payment source | Source: Ambulatory Visit | Attending: Obstetrics and Gynecology | Admitting: Obstetrics and Gynecology

## 2018-08-09 DIAGNOSIS — Z3A38 38 weeks gestation of pregnancy: Secondary | ICD-10-CM

## 2018-08-09 DIAGNOSIS — O471 False labor at or after 37 completed weeks of gestation: Secondary | ICD-10-CM | POA: Insufficient documentation

## 2018-08-09 DIAGNOSIS — O479 False labor, unspecified: Secondary | ICD-10-CM

## 2018-08-09 DIAGNOSIS — Z3689 Encounter for other specified antenatal screening: Secondary | ICD-10-CM

## 2018-08-09 MED ORDER — BUTORPHANOL TARTRATE 1 MG/ML IJ SOLN
1.0000 mg | Freq: Once | INTRAMUSCULAR | Status: AC
  Administered 2018-08-09: 1 mg via INTRAMUSCULAR
  Filled 2018-08-09: qty 1

## 2018-08-09 MED ORDER — PROMETHAZINE HCL 25 MG/ML IJ SOLN
25.0000 mg | Freq: Once | INTRAMUSCULAR | Status: AC
  Administered 2018-08-09: 25 mg via INTRAMUSCULAR
  Filled 2018-08-09: qty 1

## 2018-08-09 NOTE — MAU Note (Signed)
PT SAYS [facility] SINCE  7PM.   VE IN OFFICE - LAST WED - 3-4.    AND STILL IN [employer]- WHERE SHE WORKS.  HAS HX OF HSV - TAKING VALTREX. DENIES S/S OF  OUTBREAK.   NO OUTBREAK DURING PREG    MRSA- 10 YEARS AGO.   GBS-  POSITIVE

## 2018-08-09 NOTE — MAU Note (Signed)
Urine sent to lab 

## 2018-08-10 ENCOUNTER — Inpatient Hospital Stay (HOSPITAL_COMMUNITY): Payer: No Typology Code available for payment source | Admitting: Anesthesiology

## 2018-08-10 ENCOUNTER — Inpatient Hospital Stay (HOSPITAL_COMMUNITY)
Admission: AD | Admit: 2018-08-10 | Discharge: 2018-08-11 | DRG: 806 | Disposition: A | Discharge status: Home / Self Care | Payer: No Typology Code available for payment source | Attending: Obstetrics & Gynecology | Admitting: Obstetrics & Gynecology

## 2018-08-10 ENCOUNTER — Other Ambulatory Visit: Payer: Self-pay

## 2018-08-10 ENCOUNTER — Encounter (HOSPITAL_COMMUNITY): Payer: Self-pay | Admitting: *Deleted

## 2018-08-10 DIAGNOSIS — O9832 Other infections with a predominantly sexual mode of transmission complicating childbirth: Secondary | ICD-10-CM | POA: Diagnosis present

## 2018-08-10 DIAGNOSIS — D649 Anemia, unspecified: Secondary | ICD-10-CM | POA: Diagnosis not present

## 2018-08-10 DIAGNOSIS — O99824 Streptococcus B carrier state complicating childbirth: Secondary | ICD-10-CM | POA: Diagnosis not present

## 2018-08-10 DIAGNOSIS — Z8614 Personal history of Methicillin resistant Staphylococcus aureus infection: Secondary | ICD-10-CM | POA: Diagnosis not present

## 2018-08-10 DIAGNOSIS — O9902 Anemia complicating childbirth: Secondary | ICD-10-CM | POA: Diagnosis not present

## 2018-08-10 DIAGNOSIS — K219 Gastro-esophageal reflux disease without esophagitis: Secondary | ICD-10-CM | POA: Diagnosis present

## 2018-08-10 DIAGNOSIS — O9962 Diseases of the digestive system complicating childbirth: Secondary | ICD-10-CM | POA: Diagnosis not present

## 2018-08-10 DIAGNOSIS — O471 False labor at or after 37 completed weeks of gestation: Secondary | ICD-10-CM | POA: Diagnosis not present

## 2018-08-10 DIAGNOSIS — A6000 Herpesviral infection of urogenital system, unspecified: Secondary | ICD-10-CM | POA: Diagnosis not present

## 2018-08-10 DIAGNOSIS — Z3A38 38 weeks gestation of pregnancy: Secondary | ICD-10-CM

## 2018-08-10 DIAGNOSIS — M5441 Lumbago with sciatica, right side: Secondary | ICD-10-CM

## 2018-08-10 DIAGNOSIS — Z3483 Encounter for supervision of other normal pregnancy, third trimester: Secondary | ICD-10-CM | POA: Diagnosis not present

## 2018-08-10 DIAGNOSIS — O34219 Maternal care for unspecified type scar from previous cesarean delivery: Secondary | ICD-10-CM | POA: Diagnosis not present

## 2018-08-10 LAB — CBC
HCT: 33.9 % — ABNORMAL LOW (ref 36.0–46.0)
Hemoglobin: 11 g/dL — ABNORMAL LOW (ref 12.0–15.0)
MCH: 29.3 pg (ref 26.0–34.0)
MCHC: 32.4 g/dL (ref 30.0–36.0)
MCV: 90.4 fL (ref 80.0–100.0)
Platelets: 248 10*3/uL (ref 150–400)
RBC: 3.75 MIL/uL — ABNORMAL LOW (ref 3.87–5.11)
RDW: 14.8 % (ref 11.5–15.5)
WBC: 17.5 10*3/uL — ABNORMAL HIGH (ref 4.0–10.5)
nRBC: 0 % (ref 0.0–0.2)

## 2018-08-10 LAB — TYPE AND SCREEN
ABO/RH(D): O POS
Antibody Screen: NEGATIVE

## 2018-08-10 LAB — RPR: RPR Ser Ql: NONREACTIVE

## 2018-08-10 MED ORDER — METHYLERGONOVINE MALEATE 0.2 MG/ML IJ SOLN: 0.2000 mg | INTRAMUSCULAR | Status: DC | PRN

## 2018-08-10 MED ORDER — FENTANYL 2.5 MCG/ML BUPIVACAINE 1/10 % EPIDURAL INFUSION (WH - ANES)
INTRAMUSCULAR | Status: AC
  Filled 2018-08-10: qty 100

## 2018-08-10 MED ORDER — EPHEDRINE 5 MG/ML INJ
10.0000 mg | INTRAVENOUS | Status: DC | PRN
  Filled 2018-08-10: qty 2

## 2018-08-10 MED ORDER — PHENYLEPHRINE 40 MCG/ML (10ML) SYRINGE FOR IV PUSH (FOR BLOOD PRESSURE SUPPORT)
80.0000 ug | PREFILLED_SYRINGE | INTRAVENOUS | Status: DC | PRN
  Filled 2018-08-10: qty 10

## 2018-08-10 MED ORDER — PENICILLIN G 3 MILLION UNITS IVPB - SIMPLE MED
3.0000 10*6.[IU] | INTRAVENOUS | Status: DC
  Administered 2018-08-10: 3 10*6.[IU] via INTRAVENOUS
  Filled 2018-08-10 (×4): qty 100

## 2018-08-10 MED ORDER — COCONUT OIL OIL
1.0000 "application " | TOPICAL_OIL | Status: DC | PRN
  Administered 2018-08-11: 1 via TOPICAL
  Filled 2018-08-10: qty 120

## 2018-08-10 MED ORDER — OXYTOCIN BOLUS FROM INFUSION
500.0000 mL | Freq: Once | INTRAVENOUS | Status: AC
  Administered 2018-08-10: 500 mL via INTRAVENOUS

## 2018-08-10 MED ORDER — DIBUCAINE 1 % RE OINT
1.0000 "application " | TOPICAL_OINTMENT | RECTAL | Status: DC | PRN
  Administered 2018-08-11: 1 via RECTAL
  Filled 2018-08-10: qty 28

## 2018-08-10 MED ORDER — LACTATED RINGERS IV SOLN
500.0000 mL | Freq: Once | INTRAVENOUS | Status: AC
  Administered 2018-08-10: 500 mL via INTRAVENOUS

## 2018-08-10 MED ORDER — FENTANYL 2.5 MCG/ML BUPIVACAINE 1/10 % EPIDURAL INFUSION (WH - ANES)
14.0000 mL/h | INTRAMUSCULAR | Status: DC | PRN
  Administered 2018-08-10: 14 mL/h via EPIDURAL

## 2018-08-10 MED ORDER — IBUPROFEN 600 MG PO TABS
600.0000 mg | ORAL_TABLET | Freq: Four times a day (QID) | ORAL | Status: DC
  Administered 2018-08-10 – 2018-08-11 (×4): 600 mg via ORAL
  Filled 2018-08-10 (×4): qty 1

## 2018-08-10 MED ORDER — PRENATAL MULTIVITAMIN CH
1.0000 | ORAL_TABLET | Freq: Every day | ORAL | Status: DC
  Administered 2018-08-11: 1 via ORAL
  Filled 2018-08-10: qty 1

## 2018-08-10 MED ORDER — BENZOCAINE-MENTHOL 20-0.5 % EX AERO
1.0000 "application " | INHALATION_SPRAY | CUTANEOUS | Status: DC | PRN
  Administered 2018-08-11: 1 via TOPICAL
  Filled 2018-08-10: qty 56

## 2018-08-10 MED ORDER — ZOLPIDEM TARTRATE 5 MG PO TABS: 5.0000 mg | ORAL_TABLET | Freq: Every evening | ORAL | Status: DC | PRN

## 2018-08-10 MED ORDER — ONDANSETRON HCL 4 MG/2ML IJ SOLN: 4.0000 mg | INTRAMUSCULAR | Status: DC | PRN

## 2018-08-10 MED ORDER — DIPHENHYDRAMINE HCL 25 MG PO CAPS: 25.0000 mg | ORAL_CAPSULE | Freq: Four times a day (QID) | ORAL | Status: DC | PRN

## 2018-08-10 MED ORDER — ACETAMINOPHEN 325 MG PO TABS
650.0000 mg | ORAL_TABLET | ORAL | Status: DC | PRN
  Administered 2018-08-11: 650 mg via ORAL
  Filled 2018-08-10: qty 2

## 2018-08-10 MED ORDER — OXYCODONE-ACETAMINOPHEN 5-325 MG PO TABS: 2.0000 | ORAL_TABLET | ORAL | Status: DC | PRN

## 2018-08-10 MED ORDER — ACETAMINOPHEN 325 MG PO TABS: 650.0000 mg | ORAL_TABLET | ORAL | Status: DC | PRN

## 2018-08-10 MED ORDER — SIMETHICONE 80 MG PO CHEW: 80.0000 mg | CHEWABLE_TABLET | ORAL | Status: DC | PRN

## 2018-08-10 MED ORDER — LIDOCAINE HCL (PF) 1 % IJ SOLN
INTRAMUSCULAR | Status: DC | PRN
  Administered 2018-08-10 (×2): 5 mL via EPIDURAL

## 2018-08-10 MED ORDER — SODIUM CHLORIDE 0.9 % IV SOLN
5.0000 10*6.[IU] | Freq: Once | INTRAVENOUS | Status: AC
  Administered 2018-08-10: 5 10*6.[IU] via INTRAVENOUS
  Filled 2018-08-10: qty 5

## 2018-08-10 MED ORDER — METHYLERGONOVINE MALEATE 0.2 MG/ML IJ SOLN
0.2000 mg | Freq: Once | INTRAMUSCULAR | Status: DC
  Filled 2018-08-10: qty 1

## 2018-08-10 MED ORDER — OXYTOCIN 40 UNITS IN NORMAL SALINE INFUSION - SIMPLE MED
2.5000 [IU]/h | INTRAVENOUS | Status: DC
  Filled 2018-08-10: qty 1000

## 2018-08-10 MED ORDER — LACTATED RINGERS IV SOLN: 500.0000 mL | INTRAVENOUS | Status: DC | PRN

## 2018-08-10 MED ORDER — SOD CITRATE-CITRIC ACID 500-334 MG/5ML PO SOLN: 30.0000 mL | ORAL | Status: DC | PRN

## 2018-08-10 MED ORDER — METHYLERGONOVINE MALEATE 0.2 MG PO TABS
0.2000 mg | ORAL_TABLET | ORAL | Status: DC | PRN
  Administered 2018-08-10: 0.2 mg via ORAL
  Filled 2018-08-10: qty 1

## 2018-08-10 MED ORDER — LACTATED RINGERS IV SOLN
INTRAVENOUS | Status: DC
  Administered 2018-08-10 (×2): via INTRAVENOUS

## 2018-08-10 MED ORDER — OXYCODONE-ACETAMINOPHEN 5-325 MG PO TABS: 1.0000 | ORAL_TABLET | ORAL | Status: DC | PRN

## 2018-08-10 MED ORDER — DIPHENHYDRAMINE HCL 50 MG/ML IJ SOLN: 12.5000 mg | INTRAMUSCULAR | Status: DC | PRN

## 2018-08-10 MED ORDER — SENNOSIDES-DOCUSATE SODIUM 8.6-50 MG PO TABS
2.0000 | ORAL_TABLET | ORAL | Status: DC
  Administered 2018-08-11: 2 via ORAL
  Filled 2018-08-10: qty 2

## 2018-08-10 MED ORDER — MISOPROSTOL 200 MCG PO TABS
800.0000 ug | ORAL_TABLET | Freq: Once | ORAL | Status: DC
  Filled 2018-08-10: qty 4

## 2018-08-10 MED ORDER — LIDOCAINE HCL (PF) 1 % IJ SOLN
30.0000 mL | INTRAMUSCULAR | Status: DC | PRN
  Filled 2018-08-10: qty 30

## 2018-08-10 MED ORDER — ONDANSETRON HCL 4 MG PO TABS: 4.0000 mg | ORAL_TABLET | ORAL | Status: DC | PRN

## 2018-08-10 MED ORDER — WITCH HAZEL-GLYCERIN EX PADS
1.0000 "application " | MEDICATED_PAD | CUTANEOUS | Status: DC | PRN
  Administered 2018-08-11: 1 via TOPICAL

## 2018-08-10 MED ORDER — PHENYLEPHRINE 40 MCG/ML (10ML) SYRINGE FOR IV PUSH (FOR BLOOD PRESSURE SUPPORT)
PREFILLED_SYRINGE | INTRAVENOUS | Status: AC
  Filled 2018-08-10: qty 10

## 2018-08-10 MED ORDER — ONDANSETRON HCL 4 MG/2ML IJ SOLN: 4.0000 mg | Freq: Four times a day (QID) | INTRAMUSCULAR | Status: DC | PRN

## 2018-08-10 NOTE — Progress Notes (Signed)
Dr Charlotta Newton at bedside discussing risks/benefits of TOLAC. Pt agreeable & signing VBAC consent with provider.

## 2018-08-10 NOTE — Anesthesia Postprocedure Evaluation (Signed)
Anesthesia Post Note  Patient: Administrator, sports  Procedure(s) Performed: AN AD HOC LABOR EPIDURAL     Patient location during evaluation: Mother Baby Anesthesia Type: Epidural Level of consciousness: awake and alert and oriented Pain management: satisfactory to patient Vital Signs Assessment: post-procedure vital signs reviewed and stable Respiratory status: respiratory function stable Cardiovascular status: stable Postop Assessment: no headache, no backache, epidural receding, patient able to bend at knees, no signs of nausea or vomiting and adequate PO intake Anesthetic complications: no    Last Vitals:  Vitals:   08/10/18 1435 08/10/18 1535  BP: 122/86 122/75  Pulse: 81 70  Resp: 18 18  Temp: 37.1 C 36.5 C  SpO2: 100%     Last Pain:  Vitals:   08/10/18 1535  TempSrc: Oral  PainSc: 4    Pain Goal: Patients Stated Pain Goal: 8 (08/10/18 0728)                 Karleen Dolphin

## 2018-08-10 NOTE — Anesthesia Procedure Notes (Signed)
Epidural Patient location during procedure: OB Start time: 08/10/2018 6:09 AM End time: 08/10/2018 6:20 AM  Staffing Anesthesiologist: Heather RobertsSinger, Jordanne Elsbury, MD Performed: anesthesiologist   Preanesthetic Checklist Completed: patient identified, site marked, pre-op evaluation, timeout performed, IV checked, risks and benefits discussed and monitors and equipment checked  Epidural Patient position: sitting Prep: DuraPrep Patient monitoring: heart rate, cardiac monitor, continuous pulse ox and blood pressure Approach: midline Location: L2-L3 Injection technique: LOR saline  Needle:  Needle type: Tuohy  Needle gauge: 17 G Needle length: 9 cm Needle insertion depth: 5 cm Catheter size: 20 Guage Catheter at skin depth: 10 cm Test dose: negative and Other  Assessment Events: blood not aspirated, injection not painful, no injection resistance and negative IV test  Additional Notes Informed consent obtained prior to proceeding including risk of failure, 1% risk of PDPH, risk of minor discomfort and bruising.  Discussed rare but serious complications including epidural abscess, permanent nerve injury, epidural hematoma.  Discussed alternatives to epidural analgesia and patient desires to proceed.  Timeout performed pre-procedure verifying patient name, procedure, and platelet count.  Patient tolerated procedure well.

## 2018-08-10 NOTE — MAU Note (Signed)
I have communicated with Andrea Dean, CNM and reviewed vital signs:  Vitals:   08/09/18 2139 08/10/18 0023  BP: 126/75 125/76  Pulse: (!) 105 88  Resp: 20 17  Temp: 98.3 F (36.8 C)     Vaginal exam:  Dilation: 3.5 Effacement (%): 70 Cervical Position: Middle Station: -2 Presentation: Vertex Exam by:: Latricia Heft, RN,   Also reviewed contraction pattern and that non-stress test is reactive.  It has been documented that patient is contracting every 2-4 minutes with no cervical change over 3 hours not indicating active labor.  Patient denies any other complaints.  Based on this report provider has given order for discharge.  A discharge order and diagnosis entered by a provider.   Labor discharge instructions reviewed with patient.

## 2018-08-10 NOTE — Anesthesia Preprocedure Evaluation (Signed)
Anesthesia Evaluation  Patient identified by MRN, date of birth, ID band Patient awake    Reviewed: Allergy & Precautions, Patient's Chart, lab work & pertinent test results  Airway Mallampati: II  TM Distance: >3 FB Neck ROM: Full    Dental no notable dental hx. (+) Teeth Intact, Dental Advisory Given   Pulmonary    Pulmonary exam normal breath sounds clear to auscultation       Cardiovascular negative cardio ROS Normal cardiovascular exam Rhythm:Regular Rate:Normal     Neuro/Psych negative neurological ROS  negative psych ROS   GI/Hepatic Neg liver ROS, GERD  Medicated and Controlled,  Endo/Other  negative endocrine ROS  Renal/GU negative Renal ROS  negative genitourinary   Musculoskeletal negative musculoskeletal ROS (+)   Abdominal   Peds  Hematology  (+) anemia , Hx/o Thrombocytopenia   Anesthesia Other Findings   Reproductive/Obstetrics (+) Pregnancy Hx/o Previous C/section with successful VBAC HSV                             Anesthesia Physical  Anesthesia Plan  ASA: II  Anesthesia Plan: Epidural   Post-op Pain Management:    Induction:   PONV Risk Score and Plan:   Airway Management Planned:   Additional Equipment:   Intra-op Plan:   Post-operative Plan:   Informed Consent: I have reviewed the patients History and Physical, chart, labs and discussed the procedure including the risks, benefits and alternatives for the proposed anesthesia with the patient or authorized representative who has indicated his/her understanding and acceptance.       Plan Discussed with: Anesthesiologist  Anesthesia Plan Comments:         Anesthesia Quick Evaluation

## 2018-08-10 NOTE — Anesthesia Pain Management Evaluation Note (Signed)
  CRNA Pain Management Visit Note  Patient: Andrea Dean, 33 y.o., female  "Hello [facility] am a [facility]. We have an anesthesia team available at all times to provide care throughout the hospital, including epidural management and anesthesia for C-section. I don't know your plan for the delivery whether it a natural birth, water birth, IV sedation, nitrous supplementation, doula or epidural, but we want to meet your pain goals."   1.Was your pain managed to your expectations on prior hospitalizations?   Yes   2.What is your expectation for pain management during this hospitalization?     Epidural  3.How can we help you reach that goal? Epidural in place  Record the patient's initial score and the patient's pain goal.   Pain: 2  Pain Goal: 3 [facility] wants you to be able to say your pain was always managed very well.  Tammey Deeg 08/10/2018

## 2018-08-10 NOTE — MAU Note (Signed)
PT JUST  LEFT -  SAYS  [facility] CLOS[facility] AT HOME.   3-4 CM   WHEN HERE.

## 2018-08-10 NOTE — H&P (Signed)
Andrea Dean is a 33 y.o. female, [id number], IUP at 38.3 weeks, [facility] of Dr Bing Ree presenting for latent labor, intact. Pt demands to stay in house, pt in pain and endorses she has had stadol before and it does not work for her pain, unsure if she wants epidural, appears very anxious about getting her penicillin for GBS+ status. Pt appears frustrated and angry.  Pt endorse + Fm. Denies vaginal leakage. Denies vaginal bleeding. Denies feeling cxt's. 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 H/O PPH with last delivery ebl 1400 due to uterine atony. X2 prior VBACs  Patient Active Problem List   Diagnosis Date Noted  . Heart palpitations 05/04/2018  . Syncope 05/04/2018  . NST (non-stress test) nonreactive 10/15/2015  . Shortness of breath 05/21/2015  . Other allergic rhinitis 05/21/2015  . 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 Vaginitis and vulvovaginitis, unspecified 03/22/2014  . Pilonidal abscess 11/07/2013  . 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 valACYclovir (VALTREX) 1000 MG tablet Take 1 tablet (1,000 mg total) by mouth daily. 30 tablet 11 08/09/2018 at Unknown time  . acetaminophen (TYLENOL) 500 MG tablet Take 500-1,000 mg by mouth daily as needed for moderate pain. Takes 1- 2 tablets as needed   Past Month at Unknown time  . Calcium Carbonate Antacid (TUMS PO) Take 1 tablet by mouth daily as needed. Takes up to 2 tablets if needed, depending on severity of reflux   Past Month at Unknown time  . cetirizine (ZYRTEC) 10 MG tablet Take 10 mg by mouth daily as needed for allergies.   More than a month at Unknown time  . Doxylamine-Pyridoxine (DICLEGIS) 10-10 MG TBEC 1 tab in AM, 1 tab mid afternoon 2 tabs at bedtime. Max dose 4 tabs daily. 100 tablet 5 More than a month at Unknown time  . Elastic Bandages & Supports (COMFORT FIT MATERNITY SUPP SM) MISC Wear as directed. 1 each 0 Past  Month at Unknown time  . fluticasone (FLONASE) 50 MCG/ACT nasal spray Place 2 sprays into both nostrils daily. 1 g 0   . NIFEdipine (PROCARDIA) 10 MG capsule Take 1 capsule (10 mg total) by mouth 4 (four) times daily. 120 capsule 0   . valACYclovir (VALTREX) 500 MG tablet Take 1,000 mg by mouth 2 (two) times daily.       Past Medical History:  Diagnosis Date  . Abnormal Pap smear    repeat WNL  . Chlamydia   . Fracture closed, fibula, shaft 2007   left, no surgical repair  . GERD (gastroesophageal reflux disease)   . H/O seasonal allergies   . Herpes genitalis in women   . MRSA (methicillin resistant staph aureus) culture positive   . Ovarian cyst   . Preterm labor   . Thrombocytopenia (HCC)    2006 DURING PREGNANCY - NO PROBLEMS WITH DELIVERY OR ANY KNOWN PROBLEMS SINCE  . Urinary tract infection   . Urticaria      No current facility-administered medications on file prior to encounter.    Current Outpatient Medications on File Prior to Encounter  Medication Sig Dispense Refill  . valACYclovir (VALTREX) 1000 MG tablet Take 1 tablet (1,000 mg total) by mouth daily. 30 tablet 11  . acetaminophen (TYLENOL) 500 MG tablet Take 500-1,000 mg by mouth daily as needed for moderate pain. Takes 1- 2 tablets as needed    . Calcium Carbonate  Antacid (TUMS PO) Take 1 tablet by mouth daily as needed. Takes up to 2 tablets if needed, depending on severity of reflux    . cetirizine (ZYRTEC) 10 MG tablet Take 10 mg by mouth daily as needed for allergies.    . Doxylamine-Pyridoxine (DICLEGIS) 10-10 MG TBEC 1 tab in AM, 1 tab mid afternoon 2 tabs at bedtime. Max dose 4 tabs daily. 100 tablet 5  . Elastic Bandages & Supports (COMFORT FIT MATERNITY SUPP SM) MISC Wear as directed. 1 each 0  . fluticasone (FLONASE) 50 MCG/ACT nasal spray Place 2 sprays into both nostrils daily. 1 g 0  . NIFEdipine (PROCARDIA) 10 MG capsule Take 1 capsule (10 mg total) by mouth 4 (four) times daily. 120 capsule 0  .  valACYclovir (VALTREX) 500 MG tablet Take 1,000 mg by mouth 2 (two) times daily.       Allergies  Allergen Reactions  . Flagyl [Metronidazole Hcl] Hives and Nausea And Vomiting  . Latex     sensitive    History of present pregnancy: Pt Info/Preference:  Screening/Consents:  Labs:   EDD: Estimated Date of Delivery: 08/21/18  Establised: No LMP recorded. Patient is pregnant.  Anatomy Scan: Date: Normal Placenta Location: Anterior Genetic Screen: Panoroma:??? AFP:  First Tri: Quad:  Office: [facility]            Md: O[place] First PNV: 10.5 wg Blood Type    Language: english Last PNV: 35.3 wg Rhogam    Flu Vaccine:  ???   Antibody    TDaP vaccine ???   GTT: Early: N/A Third Trimester: 89  Feeding Plan: breast BTL: No Rubella:  Immune  Contraception: ??? VBAC: No RPR:     Circumcision: ???   HBsAg:    Pediatrician:  thomas   HIV:     Prenatal Classes: no Additional US: Fluid normal on 01/14 GBS:  (For PCN allergy, check sensitivities)       Chlamydia: neg    MFM Referral/Consult:  GC: neg  Support Person: partner   PAP: ???  Pain Management: epidural Neonatologist Referral:  Hgb Electrophoresis:  ???  Birth Plan: no   Hgb NOB: 13.9   28W: 11.3   OB History    Gravida  8   Para  3   Term  3   Preterm      AB  4   Living  3     SAB  1   TAB  2   Ectopic  1   Multiple  0   Live Births  3          Past Medical History:  Diagnosis Date  . Abnormal Pap smear    repeat WNL  . Chlamydia   . Fracture closed, fibula, shaft 2007   left, no surgical repair  . GERD (gastroesophageal reflux disease)   . H/O seasonal allergies   . Herpes genitalis in women   . MRSA (methicillin resistant staph aureus) culture positive   . Ovarian cyst   . Preterm labor   . Thrombocytopenia (HCC)    2006 DURING PREGNANCY - NO PROBLEMS WITH DELIVERY OR ANY KNOWN PROBLEMS SINCE  . Urinary tract infection   . Urticaria    Past Surgical History:  Procedure Laterality Date  . CESAREAN  SECTION    . I & D OF PILONIADAL CYST IN OFFICE - LOCAL - FELT JITTERY AFTERWARDS    . PILONIDAL CYST EXCISION N/A 05/05/2014   Procedure:  EXCISION PILONIDAL CYST;  Surgeon: Romie Levee, MD;  Location: [facility];  Service: General;  Laterality: N/A;  . WISDOM TOOTH EXTRACTION  2004   Family History: family history includes Cancer in her paternal grandmother; Diabetes in her paternal grandmother; Heart disease in her paternal grandmother; Hypertension in her maternal grandmother and paternal grandmother; Stroke in her maternal grandmother. Social History:  reports that she has never smoked. She has never used smokeless tobacco. She reports that she does not drink alcohol or use drugs.   Prenatal Transfer Tool  Maternal Diabetes: No Genetic Screening: Normal Maternal Ultrasounds/Referrals: Normal Fetal Ultrasounds or other Referrals:  None Maternal Substance Abuse:  No Significant Maternal Medications:  None Significant Maternal Lab Results: Lab values include: Group B Strep positive  ROS:  Review of Systems  Constitutional: Negative.   HENT: Negative.   Eyes: Negative.   Respiratory: Negative.   Cardiovascular: Negative.   Gastrointestinal: Positive for abdominal pain.  Genitourinary: Negative.   Musculoskeletal: Negative.   Skin: Negative.   Neurological: Negative.   Endo/Heme/Allergies: Negative.   Psychiatric/Behavioral: Negative.      Physical Exam: BP 121/67 (BP Location: Right Arm)   Pulse (!) 110   Temp 98.1 F (36.7 C) (Oral)   Resp (!) 22   Physical Exam  Constitutional: She is oriented to person, place, and time and well-developed, well-nourished, and in no distress.  HENT:  Head: Normocephalic and atraumatic.  Eyes: Pupils are equal, round, and reactive to light. Conjunctivae are normal.  Neck: Normal range of motion. Neck supple.  Cardiovascular: Normal rate, regular rhythm and normal heart sounds.  Pulmonary/Chest: Effort normal and breath sounds normal.   Abdominal: Bowel sounds are normal.  Genitourinary:    Genitourinary Comments: Uterus soft non-tender, gravida equal to dates, pelvis adequate for vaginal delivery, no active lesion noted on vagina.   Musculoskeletal: Normal range of motion.  Neurological: She is alert and oriented to person, place, and time. Gait normal.  Skin: Skin is warm and dry.  Psychiatric: Affect normal.  Nursing note and vitals reviewed.    NST: FHR baseline 145 bpm, Variability: moderate, Accelerations:present, Decelerations:  Absent= Cat 1/Reactive UC:   regular, every 3-4 minutes, lasting 60-100 seconds SVE:   Dilation: 5.5 Effacement (%): 70 Station: -2 Exam by:: D Callaway RN , vertex verified by fetal sutures.  Leopold's: Position veretex  Labs: No results found for this or any previous visit (from the past 24 hour(s)).  Imaging:  [place] Mfm Ob Limited  Result Date: 08/04/2018 ----------------------------------------------------------------------  OBSTETRICS REPORT                       (Signed Final 08/04/2018 10:56 am) ---------------------------------------------------------------------- Patient Info  ID #:       [id number]                          D.O.B.:  [birth year] (32 yrs)  Name:       MADISON DIRENZO                   Visit Date: 08/03/2018 03:55 pm ---------------------------------------------------------------------- Performed By  Performed By:     Percell Boston          Ref. Address:      [address]                                                              [place], [place]                                                              [id number]  Attending:        Lin Landsmanorenthian Booker      [facility]                    MD  Referred By:      Gerrit HeckJESSICA EMLY                    CNM ---------------------------------------------------------------------- Orders   #  Description                            Code        Ordered By   1  US MFM OB LIMITED                     [id number]    Vonzella NippleJULIE WENZEL  ----------------------------------------------------------------------   #  Order #                     Accession #                Episode #   1  [id number]                   [id number]                 [id number]  ---------------------------------------------------------------------- Indications   [redacted] weeks gestation of pregnancy                 Z3A.37   Fetal heart rate decelerations affecting        O36.8390   management of mother  ---------------------------------------------------------------------- Fetal Evaluation  Num Of Fetuses:          1  Fetal Heart              140  Rate(bpm):  Cardiac Activity:        Observed  Presentation:            Cephalic  Placenta:                Anterior  P. Cord Insertion:       Visualized, central  Amniotic Fluid  AFI FV:      Within normal limits  AFI Sum(cm)     %Tile       Largest Pocket(cm)  7.85            8           3.17  RUQ(cm)       RLQ(cm)       LUQ(cm)        LLQ(cm)  3.17  0             1.97           2.71 ---------------------------------------------------------------------- OB History  Gravidity:    8         Term:   3        Prem:   0         SAB:   1  TOP:          2       Ectopic:  1        Living: 3 ---------------------------------------------------------------------- Gestational Age  Clinical EDD:  37w 3d                                        EDD:    08/21/18  Best:          37w 3d     Det. By:  Clinical EDD             EDD:    08/21/18 ---------------------------------------------------------------------- Cervix Uterus Adnexa  Cervix  Not visualized (advanced GA >24wks) ---------------------------------------------------------------------- Impression  Normal amniotic fluid observed ---------------------------------------------------------------------- Recommendations  Follow up as clincally indicated.  ----------------------------------------------------------------------               Lin Landsman, MD Electronically Signed Final Report   08/04/2018 10:56 am ----------------------------------------------------------------------   MAU Course: Orders Placed This Encounter  Procedures  . Cervical Exam   No orders of the defined types were placed in this encounter.   Assessment/Plan: Andrea Dean is a 33 y.o. female, [id number], IUP at 38.3 weeks, [facility] of Dr Bing Ree presenting for latent labor, intact. Pt demands to stay in house, pt in pain and endorses she has had stadol before and it does not work for her pain, unsure if she wants epidural, appears very anxious about getting her penicillin for GBS+ status. Pt appears frustrated and angry.  Pt endorse + Fm. Denies vaginal leakage. Denies vaginal bleeding. Denies feeling cxt's. 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 H/O PPH with last delivery ebl 1400 due to uterine atony. x2 Prior VBACs.   FWB: Cat 1 Fetal Tracing.   Plan: Admit to Birthing Suite TLOC Routine [facility] orders Pain med/epidural prn PCN G for GBS prophylaxis Anticipate labor progression  Report given to Dr Charlotta Newton.   Dale Morton NP-C, CNM, MSN 08/10/2018, 4:45 AM

## 2018-08-10 NOTE — Discharge Instructions (Signed)

## 2018-08-10 NOTE — MAU Provider Note (Signed)
  History     CSN: [id number]  Arrival date and time: 08/09/18 2121   First Provider Initiated Contact with Patient 08/09/18 2343       First Provider Initiated Contact with Patient 08/09/18 2343      S: [name] is a 33 y.o. [id number] at [redacted]w[redacted]d  who presents to MAU today complaining contractions q 2-3 minutes since this morning. She denies vaginal bleeding. She denies LOF. She reports normal fetal movement.  Patient verbalizes she was checked during her workday at [employer] earlier this week and determined to be 3-4cm.  O: BP 126/75 (BP Location: Right Arm)   Pulse (!) 105   Temp 98.3 F (36.8 C) (Oral)   Resp 20   Ht 5\' 3"  (1.6 m)   Wt 74.5 kg   BMI 29.10 kg/m  GENERAL: Well-developed, well-nourished female in no acute distress.  HEAD: Normocephalic, atraumatic.  CHEST: Normal effort of breathing, regular heart rate ABDOMEN: Soft, nontender, gravid  Cervical exam:  Dilation: 3.5 Effacement (%): 70, 80 Cervical Position: Middle Station: -2 Presentation: Vertex Exam by:: Latricia Heft, RN   Fetal Monitoring: Baseline: 140 Variability: moderate Accelerations: positive Decelerations: none Contractions: irregular q 2-5 min  Meds ordered this encounter  Medications  . butorphanol (STADOL) injection 1 mg  . promethazine (PHENERGAN) injection 25 mg   Patient declined offer to ambulate and recheck cervix before being discharged  A: SIUP at [redacted]w[redacted]d  False labor.  Cervix unchanged from undocumented exam earlier this week during patient's work day at [employer] and throughout three hours of evaluation in MAU Reactive tracing  P: Discharge home in stable condition with labor precautions   Calvert Cantor, CNM 08/10/2018 1:09 AM

## 2018-08-10 NOTE — Progress Notes (Addendum)
OB PN:  S: Pt resting comfortably with epidural  O: BP 95/61   Pulse 82   Temp 98.3 F (36.8 C) (Oral)   Resp 17   Ht 5\' 3"  (1.6 m)   Wt 74.5 kg   SpO2 98%   BMI 29.10 kg/m   FHT: 140bpm, moderate variablity, + accels, no decels Toco: q45min SVE: 6/80/-2  A/P: 33 y.o. F6O1308 @ [redacted]w[redacted]d for labor 1. FWB: Cat. I 2. Labor: expectant management Pain: continue with epidural GBS: positive, PCN per protocol -Prior C-section and prior VBAC x2- consent obtained, desires trial of labor -HSV2- asymptomatic, no lesions noted on exam  Myna Hidalgo, DO [phone] (cell) [phone] (office)

## 2018-08-11 LAB — CBC
HCT: 30.5 % — ABNORMAL LOW (ref 36.0–46.0)
Hemoglobin: 9.5 g/dL — ABNORMAL LOW (ref 12.0–15.0)
MCH: 28 pg (ref 26.0–34.0)
MCHC: 31.1 g/dL (ref 30.0–36.0)
MCV: 90 fL (ref 80.0–100.0)
Platelets: 223 10*3/uL (ref 150–400)
RBC: 3.39 MIL/uL — ABNORMAL LOW (ref 3.87–5.11)
RDW: 14.8 % (ref 11.5–15.5)
WBC: 12.6 10*3/uL — ABNORMAL HIGH (ref 4.0–10.5)

## 2018-08-11 MED ORDER — IBUPROFEN 600 MG PO TABS: 600.0000 mg | ORAL_TABLET | Freq: Four times a day (QID) | ORAL | 0 refills | Status: DC

## 2018-08-11 MED FILL — IBUPROFEN 600 MG TABLET: 600 | 7 days supply | Qty: 30 | Fill #0

## 2018-08-11 NOTE — Lactation Note (Signed)
This note was copied from a baby's chart. Lactation Consultation Note  Patient Name: Andrea Dean SNKNL'Z Date: 08/11/2018 Reason for consult: Follow-up assessment  2nd LC visit today to complete the process of benefit DEBP .  Mom decided on the [facility] reviewed and explained it to mom.  Copied insurance card.  Baby awake / rooting and mom latched independently with depth/ swallows noted and  Baby still feeding.  Mother informed of post-discharge support and given phone number to the lactation department, including services for phone call assistance; out-patient appointments; and breastfeeding support group. List of other breastfeeding resources in the community given in the handout. Encouraged mother to call for problems or concerns related to breastfeeding.   Maternal Data    Feeding Feeding Type: Breast Fed  LATCH Score Latch: Grasps breast easily, tongue down, lips flanged, rhythmical sucking.  Audible Swallowing: Spontaneous and intermittent  Type of Nipple: Everted at rest and after stimulation  Comfort (Breast/Nipple): Soft / non-tender  Hold (Positioning): No assistance needed to correctly position infant at breast.  LATCH Score: 10  Interventions Interventions: Breast feeding basics reviewed  Lactation Tools Discussed/Used     Consult Status Consult Status: Complete    [name] Jarman Litton 08/11/2018, 2:16 PM

## 2018-08-11 NOTE — Discharge Summary (Signed)
OB Discharge Summary     Patient Name: Andrea Dean DOB: January 15, 1986 MRN: [id number]  Date of admission: 08/10/2018 Delivering MD: Myna Hidalgo   Date of discharge: 08/11/2018  Admitting diagnosis: 38WKS CTX Intrauterine pregnancy: [redacted]w[redacted]d     Secondary diagnosis:  Active Problems:   Normal labor and delivery  Additional problems: h/o prior C-section, h/o HSV2     Discharge diagnosis: Term Pregnancy Delivered                                                                                                Post partum procedures:none  Augmentation: none  Complications: None  Hospital course:  Onset of Labor With Vaginal Delivery     33 y.o. yo [id number] at [redacted]w[redacted]d was admitted in Active Labor on 08/10/2018. Patient had an uncomplicated labor course as follows:  Membrane Rupture Time/Date: 6:50 AM ,08/10/2018   Intrapartum Procedures: Episiotomy: None [1]                                         Lacerations:  None [1]  Patient had a delivery of a Viable infant. 08/10/2018  Information for the patient's newborn:  Jamerria, Deturk Girl Terryann [[id number]]  Delivery Method: VBAC, Spontaneous(Filed from Delivery Summary)    Pateint had an uncomplicated postpartum course.  She is ambulating, tolerating a regular diet, passing flatus, and urinating well. Patient is discharged home in stable condition on 08/11/18.   Physical exam  Vitals:   08/10/18 1535 08/10/18 2100 08/11/18 0020 08/11/18 0521  BP: 122/75 116/65 117/74 114/69  Pulse: 70 78 85 76  Resp: 18 17 18 18   Temp: 97.7 F (36.5 C) 98.4 F (36.9 C) 98.2 F (36.8 C) 98.2 F (36.8 C)  TempSrc: Oral Oral Oral Oral  SpO2:      Weight:      Height:       General: alert, cooperative and no distress Lochia: appropriate Uterine Fundus: firm Incision: N/A DVT Evaluation: No evidence of DVT seen on physical exam. Labs: Lab Results  Component Value Date   WBC 12.6 (H) 08/11/2018   HGB 9.5 (L) 08/11/2018   HCT 30.5 (L) 08/11/2018   MCV  90.0 08/11/2018   PLT 223 08/11/2018   CMP Latest Ref Rng & Units 08/03/2018  Glucose 70 - 99 mg/dL 83  BUN 6 - 20 mg/dL 5(L)  Creatinine 8.67 - 1.00 mg/dL 6.72  Sodium 094 - 709 mmol/L 136  Potassium 3.5 - 5.1 mmol/L 3.7  Chloride 98 - 111 mmol/L 105  CO2 22 - 32 mmol/L 24  Calcium 8.9 - 10.3 mg/dL 9.1  Total Protein 6.5 - 8.1 g/dL 7.1  Total Bilirubin 0.3 - 1.2 mg/dL 0.5  Alkaline Phos 38 - 126 U/L 95  AST 15 - 41 U/L 20  ALT 0 - 44 U/L 11    Discharge instruction: per After Visit Summary and "Baby and Me Booklet".  After visit meds:  Allergies as of 08/11/2018      Reactions  Flagyl [metronidazole Hcl] Hives, Nausea And Vomiting   Latex    sensitive      Medication List    TAKE these medications   COMFORT FIT MATERNITY SUPP SM Misc Wear as directed.   ibuprofen 600 MG tablet Commonly known as:  ADVIL,MOTRIN Take 1 tablet (600 mg total) by mouth every 6 (six) hours.   prenatal multivitamin Tabs tablet Take 1 tablet by mouth daily at 12 noon.   valACYclovir 1000 MG tablet Commonly known as:  VALTREX Take 1 tablet (1,000 mg total) by mouth daily.       Diet: routine diet  Activity: Advance as tolerated. Pelvic rest for 6 weeks.   Outpatient follow up:6 weeks Follow up Appt: Future Appointments  Date Time Provider Department Center  08/26/2018  8:00 AM Hilty, Lisette Abu, MD [facility]   Follow up Visit:No follow-ups on file.  Postpartum contraception: Progesterone only pills  Newborn Data: Live born female  Birth Weight: 6 lb 1 oz (2750 g) APGAR: 9, 9  Newborn Delivery   Birth date/time:  08/10/2018 11:49:00 Delivery type:  VBAC, Spontaneous     Baby Feeding: Breast Disposition:home with mother   08/11/2018 Sharon Seller, DO

## 2018-08-11 NOTE — Lactation Note (Signed)
This note was copied from a baby's chart. Lactation Consultation Note  Patient Name: Andrea Dean KGMWN'U Date: 08/11/2018 Reason for consult: Initial assessment;Early term 37-38.6wks;Infant weight loss(2 % weight loss )  Baby is 21 hours old  LC reviewed and updated the doc flow sheets for the consult feeding  Worked with mom to obtain the depth and flip upper lip to flanged position and comfort  And swallows increased.  Mom started with the cradle position and obtain some depth, but not deep enough to prevent soreness.  Per mom nipples are alittle sore / was given comfort gels and coconut oil from the RN .  LC instructed mom on the use shells alternating with comfort gels.  Sore nipple and engorgement prevention and tx reviewed.  Per mom has hand pump at home and is a [employer] [place] and requested her DEBP benefits pump.  Mo  Forgot her insurance card so dad is going home to obtain it and will call for [facility] at that time.  Mother informed of post-discharge support and given phone number to the lactation department, including services for phone call assistance; out-patient appointments; and breastfeeding support group. List of other breastfeeding resources in the community given in the handout. Encouraged mother to call for problems or concerns related to breastfeeding.    Maternal Data Has patient been taught Hand Expression?: Yes  Feeding Feeding Type: Breast Fed  LATCH Score Latch: Grasps breast easily, tongue down, lips flanged, rhythmical sucking.  Audible Swallowing: Spontaneous and intermittent  Type of Nipple: Everted at rest and after stimulation  Comfort (Breast/Nipple): Soft / non-tender  Hold (Positioning): Assistance needed to correctly position infant at breast and maintain latch.  LATCH Score: 9  Interventions Interventions: Breast feeding basics reviewed;Assisted with latch;Skin to skin;Breast compression;Adjust position;Support pillows;Position  options;Shells  Lactation Tools Discussed/Used Tools: Shells;Comfort gels(mom declined hand pump at home ) Shell Type: Inverted Pump Review: Milk Storage   Consult Status Consult Status: Follow-up Date: 08/11/18 Follow-up type: In-patient    [name] [name] Atchley 08/11/2018, 9:37 AM

## 2018-08-11 NOTE — Discharge Instructions (Signed)
Vaginal Delivery, Care After °Refer to this sheet in the next few weeks. These instructions provide you with information about caring for yourself after vaginal delivery. Your health care provider may also give you more specific instructions. Your treatment has been planned according to current medical practices, but problems sometimes occur. Call your health care provider if you have any problems or questions. °What can I expect after the procedure? °After vaginal delivery, it is common to have: °· Some bleeding from your vagina. °· Soreness in your abdomen, your vagina, and the area of skin between your vaginal opening and your anus (perineum). °· Pelvic cramps. °· Fatigue. °Follow these instructions at home: °Medicines °· Take over-the-counter and prescription medicines only as told by your health care provider. °· If you were prescribed an antibiotic medicine, take it as told by your health care provider. Do not stop taking the antibiotic until it is finished. °Driving ° °· Do not drive or operate heavy machinery while taking prescription pain medicine. °· Do not drive for 24 hours if you received a sedative. °Lifestyle °· Do not drink alcohol. This is especially important if you are breastfeeding or taking medicine to relieve pain. °· Do not use tobacco products, including cigarettes, chewing tobacco, or e-cigarettes. If you need help quitting, ask your health care provider. °Eating and drinking °· Drink at least 8 eight-ounce glasses of water every day unless you are told not to by your health care provider. If you choose to breastfeed your baby, you may need to drink more water than this. °· Eat high-fiber foods every day. These foods may help prevent or relieve constipation. High-fiber foods include: °? Whole grain cereals and breads. °? Brown rice. °? Beans. °? Fresh fruits and vegetables. °Activity °· Return to your normal activities as told by your health care provider. Ask your health care provider what  activities are safe for you. °· Rest as much as possible. Try to rest or take a nap when your baby is sleeping. °· Do not lift anything that is heavier than your baby or 10 lb (4.5 kg) until your health care provider says that it is safe. °· Talk with your health care provider about when you can engage in sexual activity. This may depend on your: °? Risk of infection. °? Rate of healing. °? Comfort and desire to engage in sexual activity. °Vaginal Care °· If you have an episiotomy or a vaginal tear, check the area every day for signs of infection. Check for: °? More redness, swelling, or pain. °? More fluid or blood. °? Warmth. °? Pus or a bad smell. °· Do not use tampons or douches until your health care provider says this is safe. °· Watch for any blood clots that may pass from your vagina. These may look like clumps of dark red, brown, or black discharge. °General instructions °· Keep your perineum clean and dry as told by your health care provider. °· Wear loose, comfortable clothing. °· Wipe from front to back when you use the toilet. °· Ask your health care provider if you can shower or take a bath. If you had an episiotomy or a perineal tear during labor and delivery, your health care provider may tell you not to take baths for a certain length of time. °· Wear a bra that supports your breasts and fits you well. °· If possible, have someone help you with household activities and help care for your baby for at least a few days after you   leave the hospital. °· Keep all follow-up visits for you and your baby as told by your health care provider. This is important. °Contact a health care provider if: °· You have: °? Vaginal discharge that has a bad smell. °? Difficulty urinating. °? Pain when urinating. °? A sudden increase or decrease in the frequency of your bowel movements. °? More redness, swelling, or pain around your episiotomy or vaginal tear. °? More fluid or blood coming from your episiotomy or vaginal  tear. °? Pus or a bad smell coming from your episiotomy or vaginal tear. °? A fever. °? A rash. °? Little or no interest in activities you used to enjoy. °? Questions about caring for yourself or your baby. °· Your episiotomy or vaginal tear feels warm to the touch. °· Your episiotomy or vaginal tear is separating or does not appear to be healing. °· Your breasts are painful, hard, or turn red. °· You feel unusually sad or worried. °· You feel nauseous or you vomit. °· You pass large blood clots from your vagina. If you pass a blood clot from your vagina, save it to show to your health care provider. Do not flush blood clots down the toilet without having your health care provider look at them. °· You urinate more than usual. °· You are dizzy or light-headed. °· You have not breastfed at all and you have not had a menstrual period for 12 weeks after delivery. °· You have stopped breastfeeding and you have not had a menstrual period for 12 weeks after you stopped breastfeeding. °Get help right away if: °· You have: °? Pain that does not go away or does not get better with medicine. °? Chest pain. °? Difficulty breathing. °? Blurred vision or spots in your vision. °? Thoughts about hurting yourself or your baby. °· You develop pain in your abdomen or in one of your legs. °· You develop a severe headache. °· You faint. °· You bleed from your vagina so much that you fill two sanitary pads in one hour. °This information is not intended to replace advice given to you by your health care provider. Make sure you discuss any questions you have with your health care provider. °Document Released: 07/04/2000 Document Revised: 12/19/2015 Document Reviewed: 07/22/2015 °Elsevier Interactive Patient Education © 2019 [employer] ° °

## 2018-08-11 NOTE — Progress Notes (Signed)
Postpartum Note Day # 1  S:  Patient resting comfortable in bed.  Pain controlled.  Tolerating general diet. No flatus, no BM.  Lochia moderate.  Ambulating without difficulty.  She denies n/v/f/c, SOB, or CP.  Pt plans on breastfeeding.  O: Temp:  [97.7 F (36.5 C)-98.7 F (37.1 C)] 98.2 F (36.8 C) (01/22 0521) Pulse Rate:  [70-126] 76 (01/22 0521) Resp:  [16-20] 18 (01/22 0521) BP: (94-126)/(52-104) 114/69 (01/22 0521) SpO2:  [100 %] 100 % (01/21 1435)   Gen: A&Ox3, NAD CV: RRR Resp: CTAB Abdomen: soft, NT, ND Uterus: firm, non-tender, below umbilicus Ext: No edema, no calf tenderness bilaterally, SCDs in place  Labs:  Recent Labs    08/10/18 0507 08/11/18 0537  HGB 11.0* 9.5*    A/P: Pt is a 33 y.o. [id number] s/p NSVD, PPD#1  - Pain well controlled -GU: voiding freely -GI: Tolerating general diet -Activity: encouraged sitting up to chair and ambulation as tolerated -Prophylaxis: early ambulation -Labs: stable as above, plan for iron daily  DISPO: Consider early discharge home today, pending baby's discharge  Myna Hidalgo, DO [phone] (cell) [phone] (office)

## 2018-08-16 ENCOUNTER — Ambulatory Visit: Payer: Self-pay

## 2018-08-16 NOTE — Lactation Note (Signed)
This note was copied from a baby's chart. 08/16/2018  Name: Andrea Dean MRN: [id number] Date of Birth: 08/10/2018 Gestational Age: Gestational Age: [redacted]w[redacted]d Birth Weight: 97 oz Weight today:    6 pounds 5.6 ounces (2882 grams) with clean newborn diaper  Infant presents today with mom for feeding assessment. Mom is experiencing nipple pain with feeding on the right breast. 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   Infant has gained 182 grams in the last 5 days with an average daily weight gain of 36 grams a day. Infant is above birthweight.   Mom reports infant is feeding about every 3 hours. Mom has to awaken for about half of the feedings. Discussed with mom that now that infant is back at birthweight, she can demand feed. Discussed not allowing infant to go longer than 4 hours.   Infant fed on the right breast and transferred 46 ml. Infant choked some on the breast initially and then improved with feeding. Mom reports pain to right nipple and right nipple is scabbed to the center. Mom reports pain is lessening.   Infant with thick labial frenulum that inserts at the bottom of the gum ridge. Infant with divot to center of her gum ridge. Mom has a gap between her upper teeth and 3 other children have a gap between their teeth. Infant with anterior lingual frenulum with signs of posterior lingual frenulum. Infant with good tongue extension and tongue pointing downwards with extension and good tongue lateralization. Infant with strong suckle on gloved finger with good tongue extension and cupping noted. Mom with asymmetrical/compressed nipple with feeding. Infant is gaining well. Mom given website information and local provider list. Gave mom info on Medicaid providers [facility] and [facility]).   Infant to follow up with Dr. Maisie Fus on 2/4. Mom has not been called by [facility] yet. Infant to follow up with Lactation as needed or 1-5 days post tongue released of completed.     General Information: Mother's reason for visit: Feeding assessment, nipple pain, Tongue tie Consult: Initial Lactation consultant: Andrea Stain RN,IBCLC Breastfeeding experience: Feeding on demand, mom awaking for 1/2 of the feedings Maternal medical conditions: Post-partum hemorrhage(with last infant, not this infant) Maternal medications: Pre-natal vitamin, Other, Motrin (ibuprofen), Stool softener(Valtrex)  Breastfeeding History: Frequency of breast feeding: every 3 hours, infant awakens for 1/2 of feedings Duration of feeding: 10 minutes, usually one breast per feeding  Supplementation:                 Pump type: Medela pump in style(Willow pump) Pump frequency: a few times a day Pump volume: 1-5 ounces  Infant Output Assessment: Voids per 24 hours: 8+ Urine color: Clear yellow Stools per 24 hours: 8+ Stool color: Yellow  Breast Assessment: Breast: Filling, Compressible Nipple: Erect, Scabs Pain level: 5(mostly with initial latch, inproving after feeding) Pain interventions: Bra, Coconut oil, Expressed breast milk  Feeding Assessment: Infant oral assessment: Variance Infant oral assessment comment: Infant with thick labial frenulum that inserts at the bottom of the gum ridge. Infant with divot to center of her gum ridge. Mom has a gap between her upper teeth and 3 other children have a gap between their teeth. Infant with anterior lingual frenulum with signs of posterior lingual frenulum. Infant with good tongue extension and tongue pointing downwards with extension and good tongue lateralization. Infant with strong suckle on gloved finger with good tongue extension and cupping noted. Mom with asymmetrical/compressed nipple with feeding. Infant is gaining well.  Mom given website information and local provider list. Gave mom info on Medicaid providers [facility] and [facility])   Positioning: Cross cradle(left breast) Latch: 1 - Repeated attempts needed to sustain latch,  nipple held in mouth throughout feeding, stimulation needed to elicit sucking reflex. Audible swallowing: 2 - Spontaneous and intermittent Type of nipple: 2 - Everted at rest and after stimulation Comfort: 1 - Filling, red/small blisters or bruises, mild/mod discomfort Hold: 2 - No assistance needed to correctly position infant at breast LATCH score: 8 Latch assessment: Deep Lips flanged: Yes Suck assessment: Displays both   Pre-feed weight: 2882 grams Post feed weight: 2890 grams Amount transferred: 8 ml Amount supplemented: 0  Additional Feeding Assessment: Infant oral assessment: Variance Infant oral assessment comment: see above Positioning: Cross cradle(right breast) Latch: 2 - Grasps breast easily, tongue down, lips flanged, rhythmical sucking. Audible swallowing: 2 - Spontaneous and intermittent Type of nipple: 2 - Everted at rest and after stimulation Comfort: 1 - Filling, red/small blisters or bruises, mild/mod discomfort Hold: 2 - No assistance needed to correctly position infant at breast LATCH score: 9 Latch assessment: Deep Lips flanged: Yes     Pre-feed weight: 2890 grams Post feed weight: 2936 grams Amount transferred: 46 ml Amount supplemented: 0  Totals: Total amount transferred: 54 ml, latched on for a few more minutes Total supplement given: 0  Total pumped: 6 ounces   Plan: 1. Offer infant the breast with feeding cues, make sure infant gets at least 8 feedings a day 2. Pre-pump to soften areola before latching if you are really full prior to feeding 3. Keep infant awake at the breast as needed 4. Empty the first breast before offering second breast 5. Feed infant the bottle using the paced bottle feeding method (video on [email or web address].[email or web address]) 6. Infant needs about 53-70 ml (2-2.5 ounces) for 8 feedings a day or 420-560 ml (14-18 ounces) in 24 hours. Infant may take more or less depending on how often she feeds. Feed her until she is satisfied. 7. Since infant  known to have tongue and lip restrictions would recommend you pump 2-3 x a day to protect your milk supply as infant grows 8. If nipples are not healing, call and ask OB for All Purpose Nipple Ointment 8. Keep up the good work 9. Thank you for allowing me to assist you and Remi today 10. Please call with any questions/concerns as needed [phone] 11. Follow up with Lactation as needed or 1-5 days post tongue/lip release if completed   [facility] RN, IBCLC                                                      Andrea Dean 08/16/2018, 10:09 AM

## 2018-08-26 ENCOUNTER — Ambulatory Visit: Payer: No Typology Code available for payment source | Admitting: Internal Medicine

## 2018-09-13 ENCOUNTER — Encounter: Payer: Self-pay | Admitting: Physician Assistant

## 2018-09-13 ENCOUNTER — Ambulatory Visit (INDEPENDENT_AMBULATORY_CARE_PROVIDER_SITE_OTHER): Payer: No Typology Code available for payment source | Admitting: Physician Assistant

## 2018-09-13 VITALS — BP 125/86 | HR 80 | Ht 63.0 in | Wt 147.2 lb

## 2018-09-13 DIAGNOSIS — R002 Palpitations: Secondary | ICD-10-CM | POA: Diagnosis not present

## 2018-09-13 DIAGNOSIS — R55 Syncope and collapse: Secondary | ICD-10-CM | POA: Diagnosis not present

## 2018-09-13 NOTE — Progress Notes (Signed)
Cardiology Office Note    Date:  09/14/2018   ID:  Andrea Dean, DOB October 16, 1985, MRN [id number]  PCP:  Tally Joe, MD  Cardiologist:  Dr. Rennis Golden   Chief Complaint  Patient presents with  . Follow-up    seen for Dr. Rennis Golden.     History of Present Illness:  Andrea Dean is a 33 y.o. female who was last seen by Dr. Rennis Golden in November 2019 with palpitation and syncope.  She was [redacted] weeks pregnant at the time with her fourth child.  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  She is also studying to be a Engineer, civil (consulting) at [employer].  She was doing clinicals when she felt her heart was racing.  She leaned against a crash cart and then subsequently passed out.  She also complained of several episodes of recurrent tachycardia palpitation prior to that.  Echocardiogram in October 2019 was normal. ZIO patch monitor did not show significant arrhythmia to explain her symptoms.  Patient finally delivered her child on 08/10/2018 with a uncomplicated postpartum course.  Patient presents today for cardiology office visit with her daughter.  She continued to have occasional palpitation episodes, however frequency is quite well managed.  She has 2 episodes weekly and each episode only lasted a few seconds at a time.  No further treatment is needed at this time.  She denies any chest pain or dizziness.  She can follow-up in 6 months for a reevaluation.  If her symptoms become more frequent or lasting longer, then we can consider adding a beta-blocker.   Past Medical History:  Diagnosis Date  . Abnormal Pap smear    repeat WNL  . Chlamydia   . Fracture closed, fibula, shaft 2007   left, no surgical repair  . GERD (gastroesophageal reflux disease)   . H/O seasonal allergies   . Herpes genitalis in women   . MRSA (methicillin resistant staph aureus) culture positive   . Ovarian cyst   . Preterm labor   . Thrombocytopenia (HCC)    2006 DURING PREGNANCY - NO PROBLEMS WITH DELIVERY OR ANY KNOWN PROBLEMS SINCE  . Urinary  tract infection   . Urticaria     Past Surgical History:  Procedure Laterality Date  . CESAREAN SECTION    . I & D OF PILONIADAL CYST IN OFFICE - LOCAL - FELT JITTERY AFTERWARDS    . PILONIDAL CYST EXCISION N/A 05/05/2014   Procedure:  EXCISION PILONIDAL CYST;  Surgeon: Romie Levee, MD;  Location: [facility];  Service: General;  Laterality: N/A;  . WISDOM TOOTH EXTRACTION  2004    Current Medications: Outpatient Medications Prior to Visit  Medication Sig Dispense Refill  . Prenatal Vit-Fe Fumarate-FA (PRENATAL MULTIVITAMIN) TABS tablet Take 1 tablet by mouth daily at 12 noon.    . valACYclovir (VALTREX) 1000 MG tablet Take 1 tablet (1,000 mg total) by mouth daily. 30 tablet 11  . Elastic Bandages & Supports (COMFORT FIT MATERNITY SUPP SM) MISC Wear as directed. 1 each 0  . ibuprofen (ADVIL,MOTRIN) 600 MG tablet Take 1 tablet (600 mg total) by mouth every 6 (six) hours. 30 tablet 0   No facility-administered medications prior to visit.      Allergies:   Flagyl [metronidazole hcl] and Latex   Social History   Socioeconomic History  . Marital status: Married    Spouse name: Not on file  . Number of children: Not on file  . Years of education: Not on file  . Highest  education level: Not on file  Occupational History  . Not on file  Social Needs  . Financial resource strain: Not on file  . Food insecurity:    Worry: Not on file    Inability: Not on file  . Transportation needs:    Medical: Not on file    Non-medical: Not on file  Tobacco Use  . Smoking status: Never Smoker  . Smokeless tobacco: Never Used  Substance and Sexual Activity  . Alcohol use: No    Alcohol/week: 0.0 standard drinks    Comment: occ  . Drug use: No  . Sexual activity: Yes    Partners: Male    Birth control/protection: None  Lifestyle  . Physical activity:    Days per week: Not on file    Minutes per session: Not on file  . Stress: Not on file  Relationships  . Social connections:    Talks  on phone: Not on file    Gets together: Not on file    Attends religious service: Not on file    Active member of club or organization: Not on file    Attends meetings of clubs or organizations: Not on file    Relationship status: Not on file  Other Topics Concern  . Not on file  Social History Narrative  . Not on file     Family History:  The patient's family history includes Cancer in her paternal grandmother; Diabetes in her paternal grandmother; Heart disease in her paternal grandmother; Hypertension in her maternal grandmother and paternal grandmother; Stroke in her maternal grandmother.   ROS:   Please see the history of present illness.    ROS All other systems reviewed and are negative.   PHYSICAL EXAM:   VS:  BP 125/86   Pulse 80   Ht 5\' 3"  (1.6 m)   Wt 147 lb 3.2 oz (66.8 kg)   BMI 26.08 kg/m    GEN: Well nourished, well developed, in no acute distress  HEENT: normal  Neck: no JVD, carotid bruits, or masses Cardiac: RRR; no murmurs, rubs, or gallops,no edema  Respiratory:  clear to auscultation bilaterally, normal work of breathing GI: soft, nontender, nondistended, + BS MS: no deformity or atrophy  Skin: warm and dry, no rash Neuro:  Alert and Oriented x 3, Strength and sensation are intact Psych: euthymic mood, full affect  Wt Readings from Last 3 Encounters:  09/13/18 147 lb 3.2 oz (66.8 kg)  08/10/18 164 lb 4 oz (74.5 kg)  08/09/18 164 lb 4 oz (74.5 kg)      Studies/Labs Reviewed:   EKG:  EKG is ordered today.  The ekg ordered today demonstrates NSR without significant ST-T wave changes  Recent Labs: 04/18/2018: Magnesium 2.0; TSH 0.828 08/03/2018: ALT 11; BUN 5; Creatinine, Ser 0.57; Potassium 3.7; Sodium 136 08/11/2018: Hemoglobin 9.5; Platelets 223   Lipid Panel    Component Value Date/Time   CHOL 170 06/13/2014 1711   CHOL 170 06/13/2014 1711   TRIG 42 06/13/2014 1711   TRIG 42 06/13/2014 1711   HDL 54 06/13/2014 1711   HDL 54 06/13/2014  1711   CHOLHDL 3.1 06/13/2014 1711   VLDL 8 06/13/2014 1711   LDLCALC 108 (H) 06/13/2014 1711    Additional studies/ records that were reviewed today include:   Echo 05/13/2018 Left ventricle: The cavity size was normal. Wall thickness was   normal. There are increased trabeculations at the left   ventricular apex - see Recommendations. Systolic function  was   normal. Wall motion was normal; there were no regional wall   motion abnormalities. Left ventricular diastolic function   parameters were normal. No left ventricular outflow tract   obstruction. - Aortic valve: Trileaflet; normal thickness leaflets.   Transvalvular velocity was within the normal range. There was no   stenosis. There was no regurgitation. Mean gradient (S): 5 mm Hg. - Mitral valve: Structurally normal valve. Transvalvular velocity   was within the normal range. There was no evidence for stenosis.   There was no regurgitation. - Left atrium: The atrium was normal in size. - Right ventricle: The cavity size was normal. Wall thickness was   normal. Systolic function was normal. - Right atrium: The atrium was normal in size. - Inferior vena cava: The vessel was normal in size. The   respirophasic diameter changes were in the normal range (>= 50%),   consistent with normal central venous pressure. - Pericardium, extracardiac: There was no pericardial effusion.    ASSESSMENT:    1. Palpitations   2. Syncope, unspecified syncope type      PLAN:  In order of problems listed above:  1.  Palpitation: Symptom occurs about twice a week and lasts only for a few seconds.  I will hold off on adding any beta-blocker at this time unless symptoms become more frequent.   2.  Syncope: No recurrence.   Medication Adjustments/Labs and Tests Ordered: Current medicines are reviewed at length with the patient today.  Concerns regarding medicines are outlined above.  Medication changes, Labs and Tests ordered today are  listed in the Patient Instructions below. Patient Instructions  Medication Instructions:   Your physician recommends that you continue on your current medications as directed. Please refer to the Current Medication list given to you today.  If you need a refill on your cardiac medications before your next appointment, please call your pharmacy.   Lab work:  NONE  If you have labs (blood work) drawn today and your tests are completely normal, you will receive your results only by: Marland Kitchen MyChart Message (if you have MyChart) OR . A paper copy in the mail If you have any lab test that is abnormal or we need to change your treatment, we will call you to review the results.  Testing/Procedures:  NONE  Follow-Up: . Your physician recommends that you schedule a follow-up appointment in 6 MONTHS (August 2020) with Dr. Rennis Golden    Any Other Special Instructions Will Be Listed Below (If Applicable).       Ramond Dial, [place]  09/14/2018 9:11 AM    [facility] [address] [place], [place], [place]  [address] Phone: [phone]; Fax: [phone]

## 2018-09-13 NOTE — Patient Instructions (Addendum)
Medication Instructions:   Your physician recommends that you continue on your current medications as directed. Please refer to the Current Medication list given to you today.  If you need a refill on your cardiac medications before your next appointment, please call your pharmacy.   Lab work:  NONE  If you have labs (blood work) drawn today and your tests are completely normal, you will receive your results only by: Marland Kitchen MyChart Message (if you have MyChart) OR . A paper copy in the mail If you have any lab test that is abnormal or we need to change your treatment, we will call you to review the results.  Testing/Procedures:  NONE  Follow-Up: . Your physician recommends that you schedule a follow-up appointment in 6 MONTHS (August 2020) with Dr. Rennis Golden    Any Other Special Instructions Will Be Listed Below (If Applicable).

## 2018-09-14 ENCOUNTER — Encounter: Payer: Self-pay | Admitting: Physician Assistant

## 2018-10-08 MED FILL — valACYclovir HCL 1 GM TABS: 1 | 30 days supply | Qty: 30 | Fill #0 | Status: TO

## 2018-10-12 MED FILL — NORLYDA 0.35 MG TABS: 0.35 | 84 days supply | Qty: 84 | Fill #0

## 2018-11-25 MED FILL — valACYclovir HCL 1 GM TABS: 1 | 30 days supply | Qty: 30 | Fill #0

## 2019-01-12 MED FILL — valACYclovir HCL 1 GM TABS: 1 | 30 days supply | Qty: 30 | Fill #0

## 2019-02-11 MED FILL — valACYclovir HCL 1 GM TABS: 1 | 90 days supply | Qty: 90 | Fill #0

## 2019-05-23 NOTE — Progress Notes (Signed)
Virtual Visit via Video Note   This visit type was conducted due to national recommendations for restrictions regarding the COVID-19 Pandemic (e.g. social distancing) in an effort to limit this patient's exposure and mitigate transmission in our community.  Due to her co-morbid illnesses, this patient is at least at moderate risk for complications without adequate follow up.  This format is felt to be most appropriate for this patient at this time.  All issues noted in this document were discussed and addressed.  A limited physical exam was performed with this format.  Please refer to the patient's chart for her consent to telehealth for [facility].   Date:  05/24/2019   ID:  Andrea Dean, DOB 03/14/1986, MRN [id number]  Patient Location: Home Provider Location: Home  PCP:  Tally Joe, MD  Cardiologist:  Chrystie Nose, MD  Electrophysiologist:  None   Evaluation Performed:  Follow-Up Visit  Chief Complaint:  palpitations  History of Present Illness:    Andrea Dean is a 33 y.o. female with a history of syncope and palpitations while [redacted] weeks pregnant with her fourth child. Echo was normal. Zio patch negative for significant arrhythmias to explain her symptoms. She was last seen in clinic by [facility] on 09/13/18. She has delivered her child one month earlier (08/10/18) and reported brief palpitations about 2 times weekly. BB therapy was held at that time.   She presents today for follow up. She has two school aged children doing virtual learning and two younger children, youngest is 50 months old. 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 She denies any further syncope. Her palpitations are rare and brief. We discussed OTC magnesium should these become more frequent. Given her stresses, she is doing phenomenally well. She does complain of headaches, but may be due to dehydration. 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   The patient does not have  symptoms concerning for COVID-19 infection (fever, chills, cough, or new shortness of breath).    Past Medical History:  Diagnosis Date  . Abnormal Pap smear    repeat WNL  . Chlamydia   . Fracture closed, fibula, shaft 2007   left, no surgical repair  . GERD (gastroesophageal reflux disease)   . H/O seasonal allergies   . Herpes genitalis in women   . MRSA (methicillin resistant staph aureus) culture positive   . Ovarian cyst   . Preterm labor   . Thrombocytopenia (HCC)    2006 DURING PREGNANCY - NO PROBLEMS WITH DELIVERY OR ANY KNOWN PROBLEMS SINCE  . Urinary tract infection   . Urticaria    Past Surgical History:  Procedure Laterality Date  . CESAREAN SECTION    . I & D OF PILONIADAL CYST IN OFFICE - LOCAL - FELT JITTERY AFTERWARDS    . PILONIDAL CYST EXCISION N/A 05/05/2014   Procedure:  EXCISION PILONIDAL CYST;  Surgeon: Romie Levee, MD;  Location: [facility];  Service: General;  Laterality: N/A;  . WISDOM TOOTH EXTRACTION  2004     Current Meds  Medication Sig  . Prenatal Vit-Fe Fumarate-FA (PRENATAL MULTIVITAMIN) TABS tablet Take 1 tablet by mouth daily at 12 noon.  . valACYclovir (VALTREX) 1000 MG tablet Take 1 tablet (1,000 mg total) by mouth daily.     Allergies:   Flagyl [metronidazole hcl] and Latex   Social History   Tobacco Use  . Smoking status: Never Smoker  . Smokeless tobacco: Never Used  Substance Use Topics  . Alcohol use: No    Alcohol/week: 0.0 standard drinks    Comment: occ  . Drug use: No     Family Hx: The patient's family history includes Cancer in her paternal grandmother; Diabetes in her paternal grandmother; Heart disease in her paternal grandmother; Hypertension in her maternal grandmother and paternal grandmother; Stroke in her maternal grandmother. There is no history of Anesthesia problems, Hypotension, Malignant hyperthermia, Pseudochol deficiency, Allergic rhinitis, Asthma, Angioedema, Eczema, Immunodeficiency, or Urticaria.  ROS:    Please see the history of present illness.     All other systems reviewed and are negative.   Prior CV studies:   The following studies were reviewed today:  Zio patch 05/13/18: Monitor shows no significant arrhythmias to explain symptoms.  Labs/Other Tests and Data Reviewed:    EKG:  No ECG reviewed.  Recent Labs: 08/03/2018: ALT 11; BUN 5; Creatinine, Ser 0.57; Potassium 3.7; Sodium 136 08/11/2018: Hemoglobin 9.5; Platelets 223   Recent Lipid Panel Lab Results  Component Value Date/Time   CHOL 170 06/13/2014 05:11 PM   CHOL 170 06/13/2014 05:11 PM   TRIG 42 06/13/2014 05:11 PM   TRIG 42 06/13/2014 05:11 PM   HDL 54 06/13/2014 05:11 PM   HDL 54 06/13/2014 05:11 PM   CHOLHDL 3.1 06/13/2014 05:11 PM   LDLCALC 108 (H) 06/13/2014 05:11 PM    Wt Readings from Last 3 Encounters:  05/24/19 142 lb (64.4 kg)  09/13/18 147 lb 3.2 oz (66.8 kg)  08/10/18 164 lb 4 oz (74.5 kg)     Objective:    Vital Signs:  Pulse 99   Ht 5\' 3"  (1.6 m)   Wt 142 lb (64.4 kg)   BMI 25.15 kg/m    VITAL SIGNS:  reviewed GEN:  no acute distress EYES:  sclerae anicteric, EOMI - Extraocular Movements Intact RESPIRATORY:  normal respiratory effort, symmetric expansion CARDIOVASCULAR:  no peripheral edema SKIN:  no rash, lesions or ulcers. MUSCULOSKELETAL:  no obvious deformities. NEURO:  alert and oriented x 3, no obvious focal deficit PSYCH:  normal affect  ASSESSMENT & PLAN:    Palpitations Palpitaitons are rare and brief. Discussed OTC magnesium   Syncope No recurrence.   COVID-19 Education: The signs and symptoms of COVID-19 were discussed with the patient and how to seek care for testing (follow up with PCP or arrange E-visit).  The importance of social distancing was discussed today.  Time:   Today, I have spent 7 minutes with the patient with telehealth technology discussing the above problems.     Medication Adjustments/Labs and Tests Ordered: Current medicines are  reviewed at length with the patient today.  Concerns regarding medicines are outlined above.   Tests Ordered: No orders of the defined types were placed in this encounter.   Medication Changes: No orders of the defined types were placed in this encounter.   Follow Up:  Either In Person or Virtual prn  Signed, Ledora Bottcher, PA  05/24/2019 10:53 AM    [place]

## 2019-05-24 ENCOUNTER — Telehealth: Payer: No Typology Code available for payment source | Admitting: Physician Assistant

## 2019-05-24 ENCOUNTER — Encounter: Payer: Self-pay | Admitting: Physician Assistant

## 2019-05-24 ENCOUNTER — Telehealth (INDEPENDENT_AMBULATORY_CARE_PROVIDER_SITE_OTHER): Payer: No Typology Code available for payment source | Admitting: Physician Assistant

## 2019-05-24 VITALS — HR 99 | Ht 63.0 in | Wt 142.0 lb

## 2019-05-24 DIAGNOSIS — R55 Syncope and collapse: Secondary | ICD-10-CM

## 2019-05-24 DIAGNOSIS — R002 Palpitations: Secondary | ICD-10-CM

## 2019-05-24 NOTE — Patient Instructions (Signed)
Medication Instructions:  Your physician recommends that you continue on your current medications as directed. Please refer to the Current Medication list given to you today.  *If you need a refill on your cardiac medications before your next appointment, please call your pharmacy*   Follow-Up: At [facility], you and your health needs are our priority.  As part of our continuing mission to provide you with exceptional heart care, we have created designated Provider Care Teams.  These Care Teams include your primary Cardiologist (physician) and Advanced Practice Providers (APPs -  Physician Assistants and Nurse Practitioners) who all work together to provide you with the care you need, when you need it.  Please follow up as needed.

## 2019-05-27 MED FILL — valACYclovir HCL 1 GM TABS: 1 | 90 days supply | Qty: 90 | Fill #0

## 2019-06-29 ENCOUNTER — Other Ambulatory Visit: Payer: Self-pay

## 2019-06-29 ENCOUNTER — Ambulatory Visit (HOSPITAL_COMMUNITY)
Admission: RE | Admit: 2019-06-29 | Discharge: 2019-06-29 | Disposition: A | Discharge status: Home / Self Care | Payer: No Typology Code available for payment source | Source: Ambulatory Visit | Attending: Family Medicine | Admitting: Family Medicine

## 2019-06-29 ENCOUNTER — Other Ambulatory Visit (HOSPITAL_COMMUNITY): Payer: Self-pay | Admitting: Family Medicine

## 2019-06-29 DIAGNOSIS — M79604 Pain in right leg: Secondary | ICD-10-CM | POA: Diagnosis not present

## 2019-06-29 DIAGNOSIS — M7989 Other specified soft tissue disorders: Secondary | ICD-10-CM | POA: Insufficient documentation

## 2019-06-29 NOTE — Progress Notes (Signed)
Right lower extremity venous duplex has been completed. Preliminary results can be found in CV Proc through chart review.  Results were given to [facility] at Dr. Lynnda Child office.  06/29/19 3:14 PM Andrea Dean RVT

## 2019-09-15 MED FILL — valACYclovir HCL 1 GM TABS: 1 | 90 days supply | Qty: 90 | Fill #0

## 2020-01-06 ENCOUNTER — Other Ambulatory Visit (HOSPITAL_COMMUNITY): Payer: Self-pay | Admitting: Obstetrics & Gynecology

## 2020-01-06 MED FILL — valACYclovir HCL 1 GM TABS: 1 | 90 days supply | Qty: 90 | Fill #0

## 2020-04-06 MED FILL — valACYclovir HCL 1 GM TABS: 1 | 34 days supply | Qty: 34 | Fill #1

## 2020-05-16 MED FILL — valACYclovir HCL 1 GM TABS: 1 | 90 days supply | Qty: 90 | Fill #2

## 2020-08-20 MED FILL — valACYclovir HCL 1 GM TABS: 1 | 90 days supply | Qty: 90 | Fill #3

## 2020-10-05 ENCOUNTER — Other Ambulatory Visit: Payer: Self-pay

## 2020-10-05 ENCOUNTER — Ambulatory Visit (HOSPITAL_COMMUNITY)
Admission: EM | Admit: 2020-10-05 | Discharge: 2020-10-05 | Disposition: A | Discharge status: Home / Self Care | Payer: No Typology Code available for payment source | Attending: Urgent Care | Admitting: Urgent Care

## 2020-10-05 ENCOUNTER — Encounter (HOSPITAL_COMMUNITY): Payer: Self-pay | Admitting: *Deleted

## 2020-10-05 DIAGNOSIS — M79672 Pain in left foot: Secondary | ICD-10-CM

## 2020-10-05 MED ORDER — NAPROXEN 500 MG PO TABS: 500.0000 mg | ORAL_TABLET | Freq: Two times a day (BID) | ORAL | 0 refills | Status: DC

## 2020-10-05 NOTE — ED Triage Notes (Signed)
Pt reports pain to left planter side of foot . Pain worse when standing . Pt does not remember any injury.

## 2020-10-05 NOTE — ED Provider Notes (Signed)
[facility]   MRN: [id number] DOB: 12/26/1985  Subjective:   Sheridan Hew is a 35 y.o. female presenting for 1 week history of persistent left-sided foot pain.  Denies falls, trauma.  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  She does have a remote history of a fracture more than 10 years ago.  Has not used any medications for relief.  She is worried about plantar fasciitis.  No current facility-administered medications for this encounter.  Current Outpatient Medications:  .  Prenatal Vit-Fe Fumarate-FA (PRENATAL MULTIVITAMIN) TABS tablet, Take 1 tablet by mouth daily at 12 noon., Disp: , Rfl:  .  valACYclovir (VALTREX) 1000 MG tablet, Take 1 tablet (1,000 mg total) by mouth daily., Disp: 30 tablet, Rfl: 11   Allergies  Allergen Reactions  . Flagyl [Metronidazole Hcl] Hives and Nausea And Vomiting  . Latex     sensitive    Past Medical History:  Diagnosis Date  . Abnormal Pap smear    repeat WNL  . Chlamydia   . Fracture closed, fibula, shaft 2007   left, no surgical repair  . GERD (gastroesophageal reflux disease)   . H/O seasonal allergies   . Herpes genitalis in women   . MRSA (methicillin resistant staph aureus) culture positive   . Ovarian cyst   . Preterm labor   . Thrombocytopenia (HCC)    2006 DURING PREGNANCY - NO PROBLEMS WITH DELIVERY OR ANY KNOWN PROBLEMS SINCE  . Urinary tract infection   . Urticaria      Past Surgical History:  Procedure Laterality Date  . CESAREAN SECTION    . I & D OF PILONIADAL CYST IN OFFICE - LOCAL - FELT JITTERY AFTERWARDS    . PILONIDAL CYST EXCISION N/A 05/05/2014   Procedure:  EXCISION PILONIDAL CYST;  Surgeon: Romie Levee, MD;  Location: [facility];  Service: General;  Laterality: N/A;  . WISDOM TOOTH EXTRACTION  2004    Family History  Problem Relation Age of Onset  . Cancer Paternal Grandmother        brain, lung  .  Diabetes Paternal Grandmother   . Heart disease Paternal Grandmother   . Hypertension Paternal Grandmother   . Hypertension Maternal Grandmother   . Stroke Maternal Grandmother   . Anesthesia problems Neg Hx   . Hypotension Neg Hx   . Malignant hyperthermia Neg Hx   . Pseudochol deficiency Neg Hx   . Allergic rhinitis Neg Hx   . Asthma Neg Hx   . Angioedema Neg Hx   . Eczema Neg Hx   . Immunodeficiency Neg Hx   . Urticaria Neg Hx     Social History   Tobacco Use  . Smoking status: Never Smoker  . Smokeless tobacco: Never Used  Vaping Use  . Vaping Use: Never used  Substance Use Topics  . Alcohol use: No    Alcohol/week: 0.0 standard drinks    Comment: occ  . Drug use: No    ROS   Objective:   Vitals: BP 117/73 (BP Location: Left Arm)   Pulse 85   Temp 98.4 F (36.9 C) (Oral)   Resp 16   LMP 09/19/2020   SpO2 97%   Breastfeeding No   Physical Exam Constitutional:      General: She is not in acute distress.    Appearance: Normal appearance. She is well-developed.  She is not ill-appearing.  HENT:     Head: Normocephalic and atraumatic.     Nose: Nose normal.     Mouth/Throat:     Mouth: Mucous membranes are moist.     Pharynx: Oropharynx is clear.  Eyes:     General: No scleral icterus.    Extraocular Movements: Extraocular movements intact.     Pupils: Pupils are equal, round, and reactive to light.  Cardiovascular:     Rate and Rhythm: Normal rate.  Pulmonary:     Effort: Pulmonary effort is normal.  Musculoskeletal:     Left foot: Normal range of motion and normal capillary refill. Tenderness (mild over lateral aspect of foot but no focal tenderness) and bony tenderness present. No swelling, deformity, laceration or crepitus.  Skin:    General: Skin is warm and dry.  Neurological:     General: No focal deficit present.     Mental Status: She is alert and oriented to person, place, and time.  Psychiatric:        Mood and Affect: Mood normal.         Behavior: Behavior normal.      Assessment and Plan :   PDMP not reviewed this encounter.  1. Foot pain, left     Is there is no bony deformity, trauma will defer imaging.  Recommended patient start naproxen for inflammatory type pain.  Consider x-ray if symptoms persist in this upcoming week. Counseled patient on potential for adverse effects with medications prescribed/recommended today, ER and return-to-clinic precautions discussed, patient verbalized understanding.    Wallis Bamberg, PA-C 10/05/20 2018

## 2020-11-23 ENCOUNTER — Other Ambulatory Visit (HOSPITAL_COMMUNITY): Payer: Self-pay

## 2020-11-23 MED FILL — Valacyclovir HCl Tab 1 GM: ORAL | 30 days supply | Qty: 30 | Fill #0 | Status: AC

## 2020-12-24 ENCOUNTER — Other Ambulatory Visit (HOSPITAL_COMMUNITY): Payer: Self-pay

## 2020-12-24 MED FILL — Valacyclovir HCl Tab 1 GM: ORAL | 26 days supply | Qty: 26 | Fill #1 | Status: AC

## 2020-12-25 ENCOUNTER — Other Ambulatory Visit (HOSPITAL_COMMUNITY): Payer: Self-pay

## 2021-01-23 ENCOUNTER — Other Ambulatory Visit (HOSPITAL_COMMUNITY): Payer: Self-pay

## 2021-01-23 MED ORDER — VALACYCLOVIR HCL 500 MG PO TABS
500.0000 mg | ORAL_TABLET | Freq: Every day | ORAL | 3 refills | Status: DC
  Filled 2021-01-23: qty 30, 30d supply, fill #0

## 2021-02-07 ENCOUNTER — Other Ambulatory Visit (HOSPITAL_COMMUNITY): Payer: Self-pay

## 2021-02-07 MED ORDER — VALACYCLOVIR HCL 1 G PO TABS
1000.0000 mg | ORAL_TABLET | Freq: Every day | ORAL | 3 refills | Status: DC
  Filled 2021-02-07: qty 30, 30d supply, fill #0
  Filled 2021-03-11: qty 90, 90d supply, fill #1

## 2021-02-11 ENCOUNTER — Other Ambulatory Visit: Payer: Self-pay | Admitting: Obstetrics

## 2021-02-11 DIAGNOSIS — B373 Candidiasis of vulva and vagina: Secondary | ICD-10-CM

## 2021-02-11 DIAGNOSIS — B3731 Acute candidiasis of vulva and vagina: Secondary | ICD-10-CM

## 2021-02-11 DIAGNOSIS — R300 Dysuria: Secondary | ICD-10-CM

## 2021-02-11 MED ORDER — FLUCONAZOLE 150 MG PO TABS: 150.0000 mg | ORAL_TABLET | Freq: Once | ORAL | 2 refills | Status: AC

## 2021-02-11 MED ORDER — NITROFURANTOIN MONOHYD MACRO 100 MG PO CAPS: 100.0000 mg | ORAL_CAPSULE | Freq: Two times a day (BID) | ORAL | 2 refills | Status: DC

## 2021-02-14 ENCOUNTER — Other Ambulatory Visit (HOSPITAL_COMMUNITY): Payer: Self-pay

## 2021-02-14 MED ORDER — VITAMIN D (ERGOCALCIFEROL) 1.25 MG (50000 UNIT) PO CAPS
50000.0000 [IU] | ORAL_CAPSULE | ORAL | 0 refills | Status: DC
  Filled 2021-02-14: qty 12, 84d supply, fill #0

## 2021-02-18 ENCOUNTER — Other Ambulatory Visit: Payer: Self-pay | Admitting: Family Medicine

## 2021-02-18 ENCOUNTER — Other Ambulatory Visit (HOSPITAL_COMMUNITY): Payer: Self-pay | Admitting: Family Medicine

## 2021-02-18 DIAGNOSIS — M545 Low back pain, unspecified: Secondary | ICD-10-CM

## 2021-02-18 DIAGNOSIS — R200 Anesthesia of skin: Secondary | ICD-10-CM

## 2021-02-20 ENCOUNTER — Other Ambulatory Visit: Payer: Self-pay

## 2021-02-20 ENCOUNTER — Encounter (HOSPITAL_COMMUNITY): Payer: Self-pay | Admitting: *Deleted

## 2021-02-20 ENCOUNTER — Other Ambulatory Visit (HOSPITAL_COMMUNITY): Payer: Self-pay

## 2021-02-20 ENCOUNTER — Ambulatory Visit (HOSPITAL_COMMUNITY)
Admission: EM | Admit: 2021-02-20 | Discharge: 2021-02-20 | Disposition: A | Discharge status: Home / Self Care | Payer: No Typology Code available for payment source | Attending: Internal Medicine | Admitting: Internal Medicine

## 2021-02-20 DIAGNOSIS — L509 Urticaria, unspecified: Secondary | ICD-10-CM

## 2021-02-20 DIAGNOSIS — L239 Allergic contact dermatitis, unspecified cause: Secondary | ICD-10-CM

## 2021-02-20 DIAGNOSIS — R21 Rash and other nonspecific skin eruption: Secondary | ICD-10-CM

## 2021-02-20 MED ORDER — PREDNISONE 20 MG PO TABS
40.0000 mg | ORAL_TABLET | Freq: Every day | ORAL | 0 refills | Status: AC
  Filled 2021-02-20: qty 10, 5d supply, fill #0

## 2021-02-20 MED ORDER — CETIRIZINE HCL 10 MG PO TABS
10.0000 mg | ORAL_TABLET | Freq: Every day | ORAL | 0 refills | Status: DC
  Filled 2021-02-20: qty 30, 30d supply, fill #0

## 2021-02-20 MED ORDER — HYDROXYZINE HCL 25 MG PO TABS
25.0000 mg | ORAL_TABLET | Freq: Four times a day (QID) | ORAL | 0 refills | Status: DC | PRN
  Filled 2021-02-20: qty 12, 3d supply, fill #0

## 2021-02-20 NOTE — ED Triage Notes (Signed)
Pt woke up with full body rash this AM.

## 2021-02-20 NOTE — ED Provider Notes (Signed)
[facility]    CSN: [id number] Arrival date & time: 02/20/21  0803      History   Chief Complaint Chief Complaint  Patient presents with   Rash    HPI Andrea Dean is a 35 y.o. female.   Patient presents with an itchy rash present to entire body that started this morning when she woke up.  Denies any recent changes to lotions, detergents, soaps, foods, etc.  Patient states that she does have a garden and works outside multiple times throughout the week but states she has had this garden for a while.  Denies any fevers or upper respiratory symptoms.  Denies any known sick contacts.   Rash  Past Medical History:  Diagnosis Date   Abnormal Pap smear    repeat WNL   Chlamydia    Fracture closed, fibula, shaft 2007   left, no surgical repair   GERD (gastroesophageal reflux disease)    H/O seasonal allergies    Herpes genitalis in women    MRSA (methicillin resistant staph aureus) culture positive    Ovarian cyst    Preterm labor    Thrombocytopenia (HCC)    2006 DURING PREGNANCY - NO PROBLEMS WITH DELIVERY OR ANY KNOWN PROBLEMS SINCE   Urinary tract infection    Urticaria     Patient Active Problem List   Diagnosis Date Noted   Normal labor and delivery 08/10/2018   Heart palpitations 05/04/2018   Syncope 05/04/2018   NST (non-stress test) nonreactive 10/15/2015   Shortness of breath 05/21/2015   Other allergic rhinitis 05/21/2015   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   Vaginitis and vulvovaginitis, unspecified 03/22/2014   Pilonidal abscess 11/07/2013   Pilonidal sinus without abscess 11/02/2013    Past Surgical History:  Procedure Laterality Date   CESAREAN SECTION     I & D OF PILONIADAL CYST IN OFFICE - LOCAL - FELT JITTERY AFTERWARDS     PILONIDAL CYST EXCISION N/A 05/05/2014   Procedure:  EXCISION PILONIDAL CYST;  Surgeon: Romie Levee, MD;  Location: [facility];  Service: General;  Laterality: N/A;   WISDOM TOOTH EXTRACTION  2004    OB  History     Gravida  8   Para  4   Term  4   Preterm  0   AB  4   Living  4      SAB  1   IAB  2   Ectopic  1   Multiple  0   Live Births  4            Home Medications    Prior to Admission medications   Medication Sig Start Date End Date Taking? Authorizing Provider  cetirizine (ZYRTEC) 10 MG tablet Take 1 tablet (10 mg total) by mouth daily. 02/20/21  Yes Lance Muss, FNP  hydrOXYzine (ATARAX/VISTARIL) 25 MG tablet Take 1 tablet (25 mg total) by mouth every 6 (six) hours as needed for itching. 02/20/21  Yes Lance Muss, FNP  predniSONE (DELTASONE) 20 MG tablet Take 2 tablets (40 mg total) by mouth daily for 5 days. 02/20/21 02/25/21 Yes Lance Muss, FNP  valACYclovir (VALTREX) 1000 MG tablet Take 1 tablet (1,000 mg total) by mouth daily. 05/14/15  Yes Brock Bad, MD  Vitamin D, Ergocalciferol, (DRISDOL) 1.25 MG (50000 UNIT) CAPS capsule Take 1 capsule (50,000 Units total) by mouth once a week. 02/14/21  Yes   naproxen (NAPROSYN) 500 MG tablet Take 1 tablet (500 mg total)  by mouth 2 (two) times daily with a meal. 10/05/20   Wallis Bamberg, PA-C  nitrofurantoin, macrocrystal-monohydrate, (MACROBID) 100 MG capsule Take 1 capsule (100 mg total) by mouth 2 (two) times daily. 1 po BID x 7days 02/11/21   Brock Bad, MD  Prenatal Vit-Fe Fumarate-FA (PRENATAL MULTIVITAMIN) TABS tablet Take 1 tablet by mouth daily at 12 noon.    [provider]  valACYclovir (VALTREX) 1000 MG tablet Take 1 tablet (1,000 mg total) by mouth daily. 02/07/21       Family History Family History  Problem Relation Age of Onset   Cancer Paternal Grandmother        brain, lung   Diabetes Paternal Grandmother    Heart disease Paternal Grandmother    Hypertension Paternal Grandmother    Hypertension Maternal Grandmother    Stroke Maternal Grandmother    Anesthesia problems Neg Hx    Hypotension Neg Hx    Malignant hyperthermia Neg Hx    Pseudochol deficiency Neg Hx     Allergic rhinitis Neg Hx    Asthma Neg Hx    Angioedema Neg Hx    Eczema Neg Hx    Immunodeficiency Neg Hx    Urticaria Neg Hx     Social History Social History   Tobacco Use   Smoking status: Never   Smokeless tobacco: Never  Vaping Use   Vaping Use: Never used  Substance Use Topics   Alcohol use: No    Alcohol/week: 0.0 standard drinks    Comment: occ   Drug use: No     Allergies   Flagyl [metronidazole hcl] and Latex   Review of Systems Review of Systems  Skin:  Positive for rash.  Per HPI  Physical Exam Triage Vital Signs ED Triage Vitals  Enc Vitals Group     BP 02/20/21 0834 118/83     Pulse Rate 02/20/21 0834 89     Resp 02/20/21 0834 18     Temp 02/20/21 0834 98.7 F (37.1 C)     Temp Source 02/20/21 0834 Oral     SpO2 02/20/21 0834 98 %     Weight --      Height --      Head Circumference --      Peak Flow --      Pain Score 02/20/21 0829 0     Pain Loc --      Pain Edu? --      Excl. in GC? --    No data found.  Updated Vital Signs BP 118/83   Pulse 89   Temp 98.7 F (37.1 C) (Oral)   Resp 18   LMP 02/04/2021 (Exact Date)   SpO2 98%   Visual Acuity Right Eye Distance:   Left Eye Distance:   Bilateral Distance:    Right Eye Near:   Left Eye Near:    Bilateral Near:     Physical Exam Constitutional:      Appearance: Normal appearance.  HENT:     Head: Normocephalic and atraumatic.  Eyes:     Extraocular Movements: Extraocular movements intact.     Conjunctiva/sclera: Conjunctivae normal.  Pulmonary:     Effort: Pulmonary effort is normal.  Skin:    General: Skin is warm and dry.     Findings: Rash present. Rash is urticarial.     Comments: Diffuse, erythematous, urticarial rash present throughout bilateral arms and bilateral lower extremities.  Neurological:     General: No focal deficit present.  Mental Status: She is alert and oriented to person, place, and time. Mental status is at baseline.  Psychiatric:         Mood and Affect: Mood normal.        Behavior: Behavior normal.        Thought Content: Thought content normal.        Judgment: Judgment normal.     UC Treatments / Results  Labs (all labs ordered are listed, but only abnormal results are displayed) Labs Reviewed - No data to display  EKG   Radiology No results found.  Procedures Procedures (including critical care time)  Medications Ordered in UC Medications - No data to display  Initial Impression / Assessment and Plan / UC Course  I have reviewed the triage vital signs and the nursing notes.  Pertinent labs & imaging results that were available during my care of the patient were reviewed by me and considered in my medical decision making (see chart for details).     Rash is consistent with allergic contact dermatitis of unknown source.  Will treat with prednisone x5 days.  Patient to take cetirizine antihistamine in the morning and hydroxyzine at night prior to going to bed to alleviate itching.  Advised patient to avoid scratching. Discussed strict return precautions. Patient verbalized understanding and is agreeable with plan.  Final Clinical Impressions(s) / UC Diagnoses   Final diagnoses:  Allergic contact dermatitis, unspecified trigger  Rash  Urticaria     Discharge Instructions      You have been prescribed prednisone to take for allergic reaction.  You have also been prescribed hydroxyzine and cetirizine.  Please take hydroxyzine at night prior to going to bed to help with itching.  You may take cetirizine in the morning to help with allergic reaction and itching.  Please do not take both cetirizine and hydroxyzine medications at the same time.     ED Prescriptions     Medication Sig Dispense Auth. Provider   predniSONE (DELTASONE) 20 MG tablet Take 2 tablets (40 mg total) by mouth daily for 5 days. 10 tablet Lance Muss, FNP   hydrOXYzine (ATARAX/VISTARIL) 25 MG tablet Take 1 tablet (25 mg total)  by mouth every 6 (six) hours as needed for itching. 12 tablet Lance Muss, FNP   cetirizine (ZYRTEC) 10 MG tablet Take 1 tablet (10 mg total) by mouth daily. 30 tablet Lance Muss, FNP      PDMP not reviewed this encounter.   Lance Muss, FNP 02/20/21 [phone]

## 2021-02-20 NOTE — Discharge Instructions (Addendum)
You have been prescribed prednisone to take for allergic reaction.  You have also been prescribed hydroxyzine and cetirizine.  Please take hydroxyzine at night prior to going to bed to help with itching.  You may take cetirizine in the morning to help with allergic reaction and itching.  Please do not take both cetirizine and hydroxyzine medications at the same time.

## 2021-02-25 ENCOUNTER — Other Ambulatory Visit: Payer: Self-pay

## 2021-02-25 ENCOUNTER — Ambulatory Visit (HOSPITAL_COMMUNITY)
Admission: RE | Admit: 2021-02-25 | Discharge: 2021-02-25 | Disposition: A | Discharge status: Home / Self Care | Payer: No Typology Code available for payment source | Source: Ambulatory Visit | Attending: Family Medicine | Admitting: Family Medicine

## 2021-02-25 DIAGNOSIS — R200 Anesthesia of skin: Secondary | ICD-10-CM | POA: Diagnosis present

## 2021-02-25 DIAGNOSIS — M545 Low back pain, unspecified: Secondary | ICD-10-CM | POA: Insufficient documentation

## 2021-02-25 IMAGING — MR MR LUMBAR SPINE W/O CM
5 series · 31 of 48 positions shown · non-contrast
Comparison: None.

CLINICAL DATA: Numbness in both legs; low back pain

EXAM:
MRI LUMBAR SPINE WITHOUT CONTRAST
TECHNIQUE: Multiplanar, multisequence MR imaging of the lumbar spine was
performed. No intravenous contrast was administered.

[Series 5: T1 · sagittal · 4.0mm · 0.81mm/px · 5 of 16 slices shown (1 of 2)]
[im 1/16]
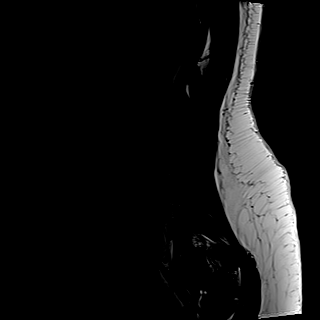
[im 4/16]
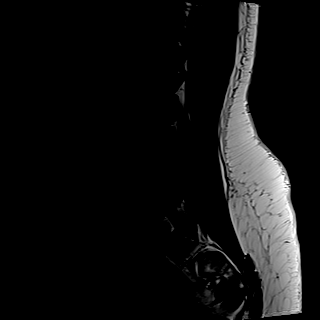
[im 8/16]
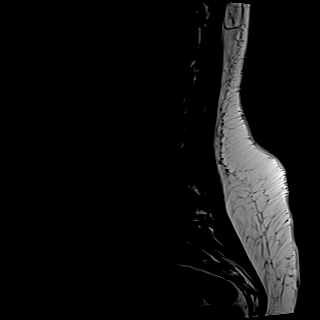
[im 12/16]
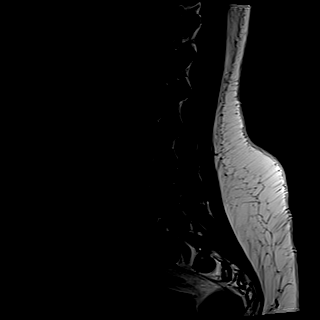
[im 16/16]
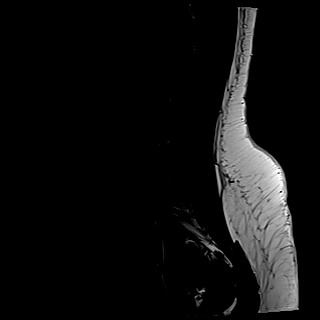

[Series 6: T2 · sagittal · 4.0mm · 0.81mm/px · 5 of 16 slices shown (1 of 2)]
[im 1/16]
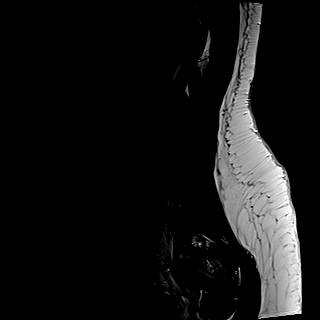
[im 4/16]
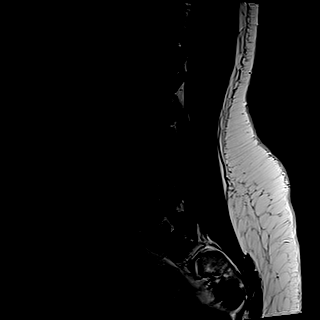
[im 8/16]
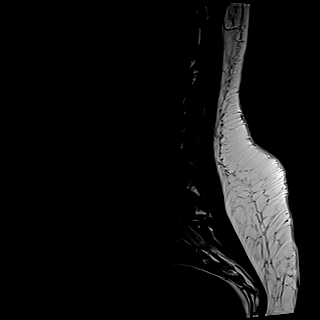
[im 12/16]
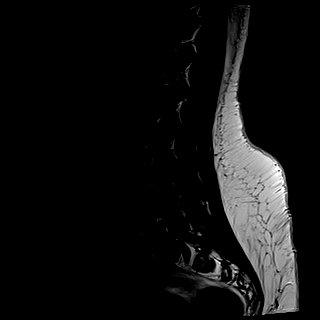
[im 16/16]
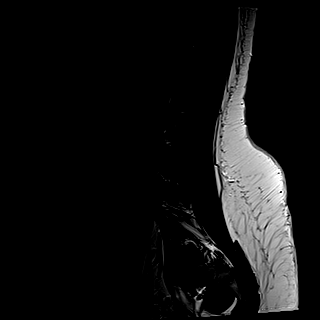

[Series 7: STIR · sagittal · 4.0mm · 0.51mm/px · 1 of 16 slices shown]
[im 1/16]
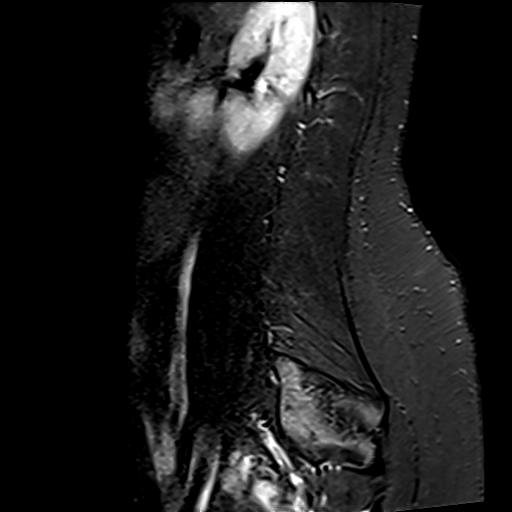

[Series 8: T2 · axial · 4.0mm · 0.62mm/px · z∈[-48,+194]mm · 10 of 45 slices shown (2 of 2)]
[im 3/45]
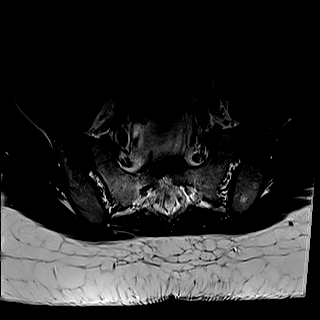
[im 6/45]
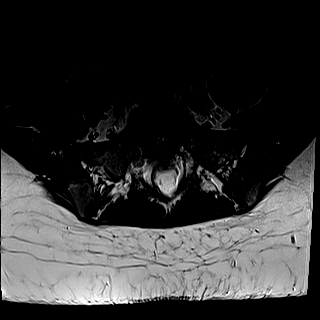
[im 9/45]
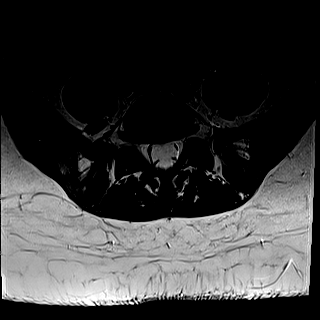
[im 15/45]
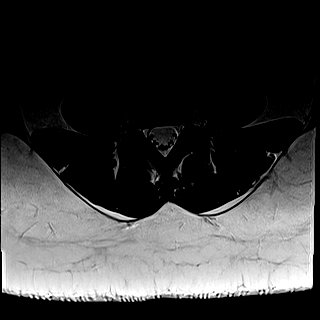
[im 21/45]
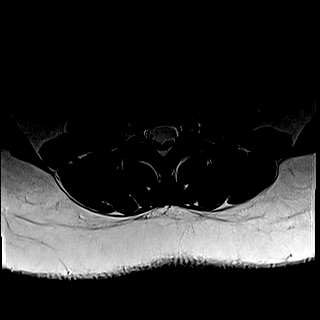
[im 24/45]
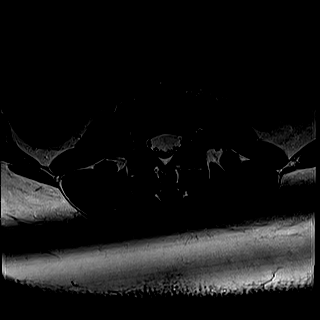
[im 27/45]
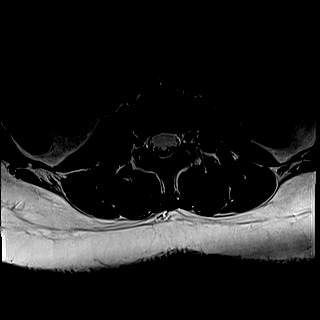
[im 33/45]
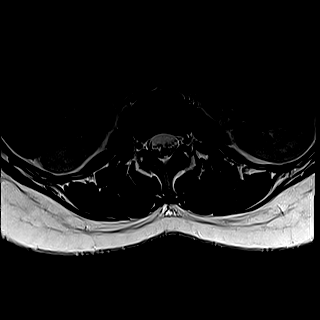
[im 39/45]
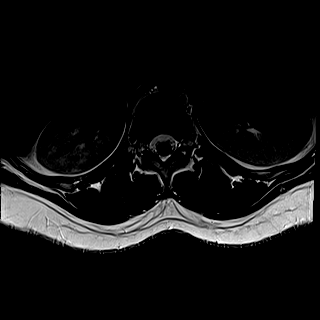
[im 45/45]
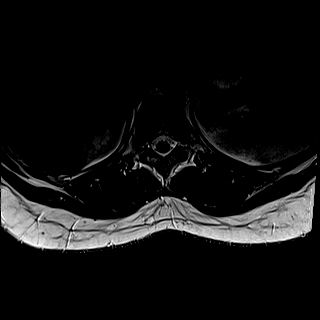

[Series 9: T1 · axial · 4.0mm · 0.39mm/px · z∈[-48,+194]mm · 10 of 45 slices shown (2 of 2)]
[im 3/45]
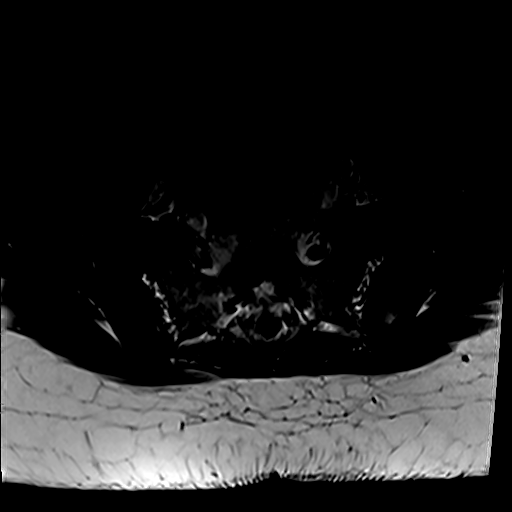
[im 6/45]
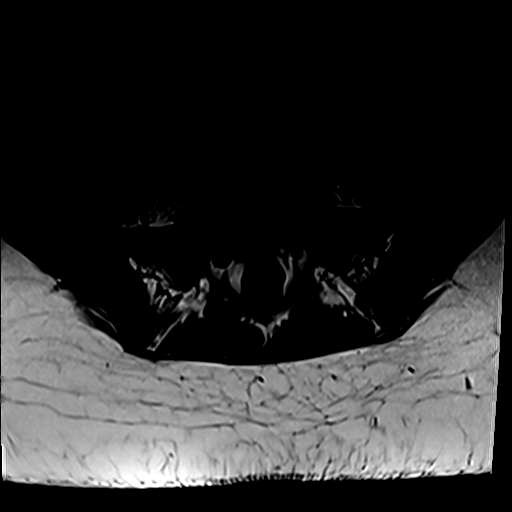
[im 9/45]
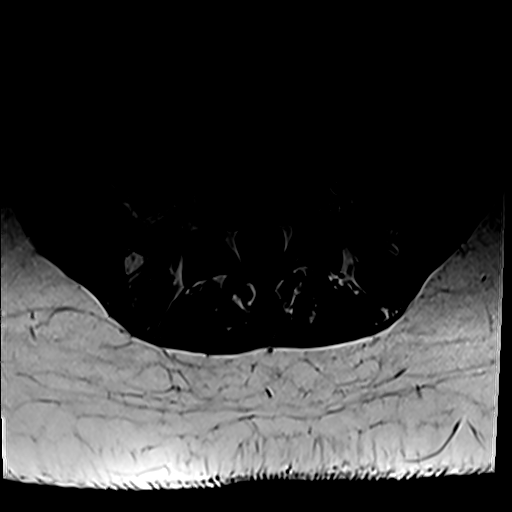
[im 15/45]
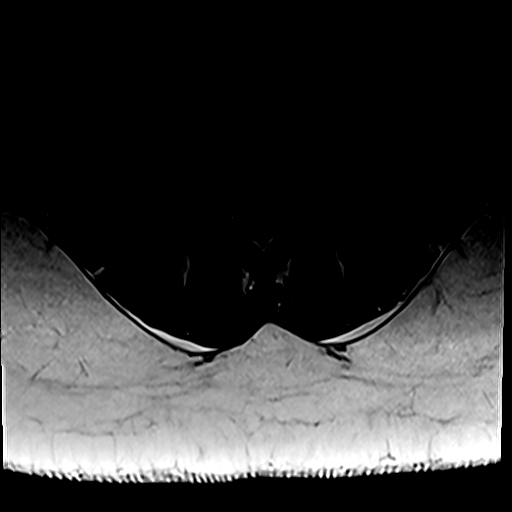
[im 21/45]
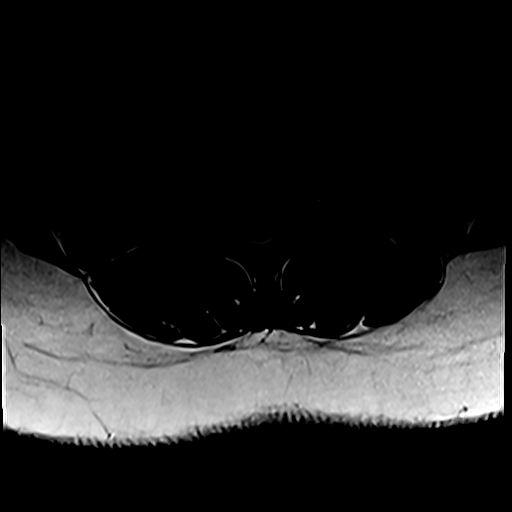
[im 24/45]
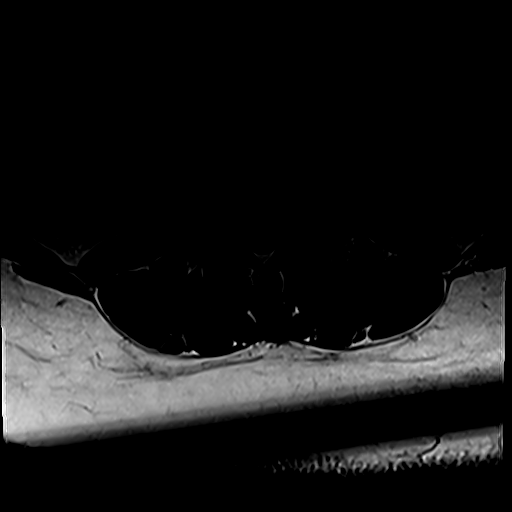
[im 27/45]
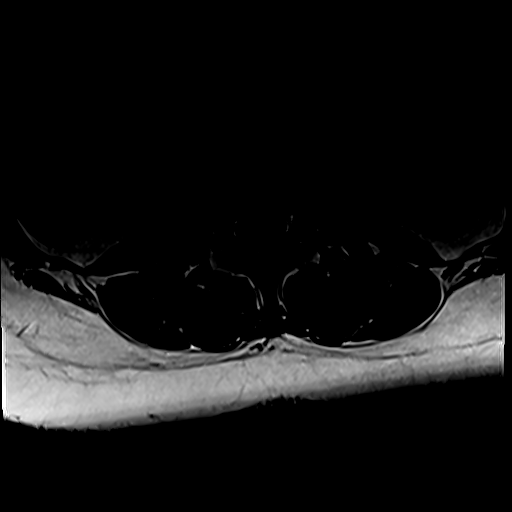
[im 33/45]
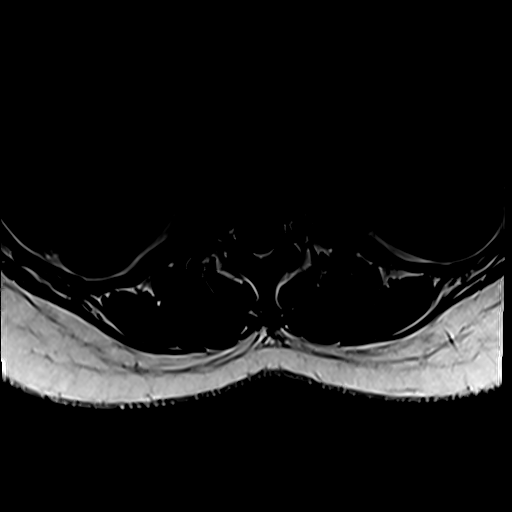
[im 39/45]
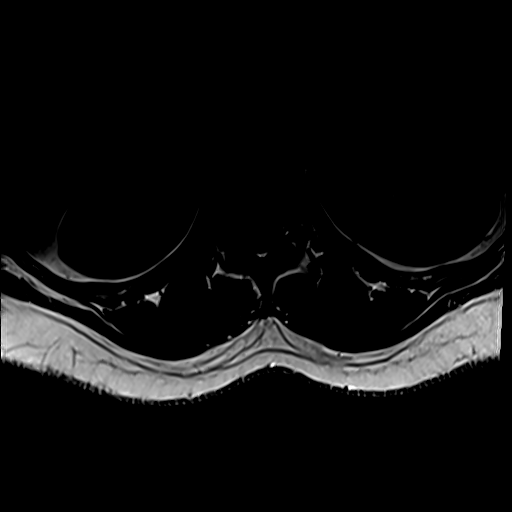
[im 45/45]
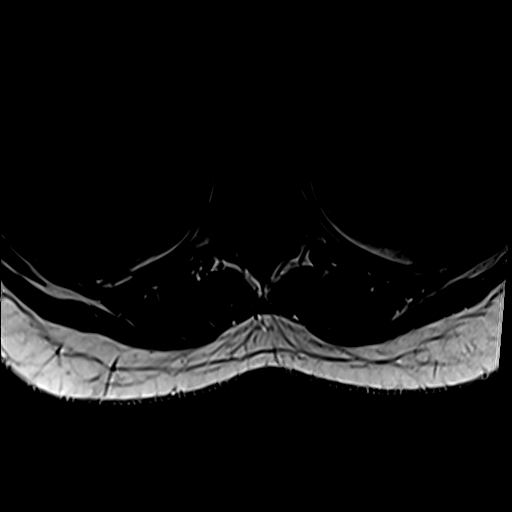

[31 of 48 positions shown; findings below may reference images not displayed]

FINDINGS: Segmentation:  Standard.

Alignment:  Preserved.

Vertebrae: Vertebral body heights are maintained. No marrow edema.
No suspicious osseous lesion.

Conus medullaris and cauda equina: Conus extends to the L1-L2 level.
Conus and cauda equina appear normal.

Paraspinal and other soft tissues: Unremarkable.

Disc levels: Intervertebral disc heights and signal are maintained.
No disc herniation. No stenosis.
IMPRESSION: Normal MRI of the lumbar spine.

## 2021-03-11 ENCOUNTER — Other Ambulatory Visit (HOSPITAL_COMMUNITY): Payer: Self-pay

## 2021-04-29 ENCOUNTER — Other Ambulatory Visit (HOSPITAL_COMMUNITY): Payer: Self-pay

## 2021-04-29 MED ORDER — PREDNISONE 10 MG (21) PO TBPK
ORAL_TABLET | ORAL | 0 refills | Status: DC
  Filled 2021-04-29: qty 21, 6d supply, fill #0

## 2021-04-30 ENCOUNTER — Other Ambulatory Visit (HOSPITAL_COMMUNITY): Payer: Self-pay

## 2021-04-30 MED ORDER — VITAMIN D (ERGOCALCIFEROL) 1.25 MG (50000 UNIT) PO CAPS
50000.0000 [IU] | ORAL_CAPSULE | ORAL | 3 refills | Status: DC
  Filled 2021-04-30 – 2021-05-14 (×2): qty 12, 84d supply, fill #0

## 2021-05-08 ENCOUNTER — Other Ambulatory Visit (HOSPITAL_COMMUNITY): Payer: Self-pay

## 2021-05-14 ENCOUNTER — Other Ambulatory Visit (HOSPITAL_COMMUNITY): Payer: Self-pay

## 2021-05-23 ENCOUNTER — Other Ambulatory Visit (HOSPITAL_COMMUNITY): Payer: Self-pay

## 2021-05-23 MED ORDER — PREDNISONE 5 MG PO TABS
ORAL_TABLET | ORAL | 0 refills | Status: DC
  Filled 2021-05-23: qty 63, 18d supply, fill #0

## 2021-05-27 ENCOUNTER — Encounter: Payer: Self-pay | Admitting: Neurology

## 2021-05-27 ENCOUNTER — Ambulatory Visit (INDEPENDENT_AMBULATORY_CARE_PROVIDER_SITE_OTHER): Payer: No Typology Code available for payment source | Admitting: Neurology

## 2021-05-27 VITALS — BP 112/72 | HR 87 | Ht 63.0 in | Wt 156.0 lb

## 2021-05-27 DIAGNOSIS — R292 Abnormal reflex: Secondary | ICD-10-CM | POA: Diagnosis not present

## 2021-05-27 DIAGNOSIS — R202 Paresthesia of skin: Secondary | ICD-10-CM

## 2021-05-27 NOTE — Progress Notes (Signed)
ASSESSMENT AND PLAN  Andrea Dean is a 35 y.o. female   Few months history of intermittent upper and lower extremity paresthesia  Fatigue  Hyperreflexia on examination, well-preserved sensory examination,  No evidence of peripheral neuropathy,  MRI of cervical and brain to rule out central nervous system pathology,  Will call her report   DIAGNOSTIC DATA (LABS, IMAGING, TESTING) - I reviewed patient records, labs, notes, testing and imaging myself where available. Laboratory evaluation in October 2022 from rheumatologist, negative angiotensin-converting enzyme, ANA, CCP antibody, C3, C4 was slightly elevated, CPK was 112, C-reactive protein 0.81, normal ESR of 9, HLA-B27 was negative, hepatitis B core antibody was negative, surface antigen was negative, surface antibody was present 351, negative hepatitis C, myeloperoxidase antibody was negative, TB was negative, normal CBC hemoglobin 14, uric acid 5.0, normal CMP, creatinine of 0.72 B12 498,   MEDICAL HISTORY:  Andrea Dean is a 35 year old female, seen in request by her rheumatologist Dr. Lahoma Rocker, for evaluation of paresthesia all 4 limbs, her primary care physician is Dr. Antony Contras, initial evaluation was on May 27, 2021  I reviewed and summarized the referring note. PMHX.  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  Shortly after her COVID infection in January 2022, she began to noticed bilateral lower extremity deep achy pain from hip down, she described numbness tingling itchy sensation, also has upper extremity involvement, burning sensation, getting worse over the past few months, she also noticed difficulty with prolonged walking such as going up and  down steps, she felt limb heaviness,  She complains of lack of sleep, low back pain,  Personally reviewed MRI of lumbar spine in August 2022 that was normal  PHYSICAL EXAM:   Vitals:   05/27/21 1539  BP: 112/72  Pulse: 87  Weight: 156 lb (70.8 kg)  Height: _0  (1.6 m)   Not recorded     Body mass index is 27.63 kg/m.  PHYSICAL EXAMNIATION:  Gen: NAD, conversant, well nourised, well groomed                     Cardiovascular: Regular rate rhythm, no peripheral edema, warm, nontender. Eyes: Conjunctivae clear without exudates or hemorrhage Neck: Supple, no carotid bruits. Pulmonary: Clear to auscultation bilaterally   NEUROLOGICAL EXAM:  MENTAL STATUS: Speech:    Speech is normal; fluent and spontaneous with normal comprehension.  Cognition:     Orientation to time, place and person     Normal recent and remote memory     Normal Attention span and concentration     Normal Language, naming, repeating,spontaneous speech     Fund of knowledge   CRANIAL NERVES: CN II: Visual fields are full to confrontation. Pupils are round equal and briskly reactive to light. CN III, IV, VI: extraocular movement are normal. No ptosis. CN V: Facial sensation is intact to light touch CN VII: Face is symmetric with normal eye closure  CN VIII: Hearing is normal to causal conversation. CN IX, X: Phonation is normal. CN XI: Head turning and shoulder shrug are intact  MOTOR: There is no pronator drift of out-stretched arms. Muscle bulk and tone are normal. Muscle strength is normal.  REFLEXES: Reflexes are 3 and symmetric at  the biceps, triceps, knees, and ankles. Plantar responses are flexor bilaterally SENSORY: Intact to light touch, pinprick and vibratory sensation are intact in fingers and toes.  COORDINATION: There is no trunk or limb dysmetria noted.  GAIT/STANCE: Posture is normal. Gait is steady with normal steps, base, arm swing, and turning. Heel and toe walking are  normal. Tandem gait is normal.  Romberg is absent.  REVIEW OF SYSTEMS:  Full 14 system review of systems performed and notable only for as above All other review of systems were negative.   ALLERGIES: Allergies  Allergen Reactions   Flagyl [Metronidazole Hcl] Hives and Nausea And Vomiting   Latex     sensitive    HOME MEDICATIONS: Current Outpatient Medications  Medication Sig Dispense Refill   cetirizine (ZYRTEC) 10 MG tablet Take 1 tablet (10 mg total) by mouth daily. 30 tablet 0   hydrOXYzine (ATARAX/VISTARIL) 25 MG tablet Take 1 tablet (25 mg total) by mouth every 6 (six) hours as needed for itching. 12 tablet 0   naproxen (NAPROSYN) 500 MG tablet Take 1 tablet (500 mg total) by mouth 2 (two) times daily with a meal. 30 tablet 0   predniSONE (STERAPRED UNI-PAK 21 TAB) 10 MG (21) TBPK tablet Taper once a day as directed 6-5-4-3-2-1 21 each 0   valACYclovir (VALTREX) 1000 MG tablet Take 1 tablet (1,000 mg total) by mouth daily. 30 tablet 11   Vitamin D, Ergocalciferol, (DRISDOL) 1.25 MG (50000 UNIT) CAPS capsule Take 1 capsule (50,000 Units total) by mouth once a week. 13 capsule 3   No current facility-administered medications for this visit.    PAST MEDICAL HISTORY: Past Medical History:  Diagnosis Date   Abnormal Pap smear    repeat WNL   Chlamydia    Fracture closed, fibula, shaft 2007   left, no surgical repair   GERD (gastroesophageal reflux disease)    H/O seasonal allergies    Herpes genitalis in women    MRSA (methicillin resistant staph aureus) culture positive    Ovarian cyst    Preterm labor    Thrombocytopenia ([place])    2006 DURING PREGNANCY - NO PROBLEMS WITH DELIVERY OR ANY KNOWN PROBLEMS SINCE   Urinary tract infection    Urticaria     PAST SURGICAL HISTORY: Past Surgical History:  Procedure Laterality Date   CESAREAN SECTION     I & D OF PILONIADAL CYST IN OFFICE - LOCAL - FELT JITTERY AFTERWARDS     PILONIDAL CYST EXCISION N/A 05/05/2014    Procedure:  EXCISION PILONIDAL CYST;  Surgeon: Leighton Ruff, MD;  Location: [facility];  Service: General;  Laterality: N/A;   WISDOM TOOTH EXTRACTION  2004    FAMILY HISTORY: Family History  Problem Relation Age of Onset   Cancer Paternal Grandmother        brain, lung   Diabetes Paternal Grandmother    Heart disease Paternal Grandmother    Hypertension Paternal Grandmother    Hypertension Maternal Grandmother    Stroke Maternal Grandmother    Anesthesia problems Neg Hx    Hypotension Neg Hx    Malignant hyperthermia Neg Hx    Pseudochol deficiency Neg Hx    Allergic rhinitis Neg Hx    Asthma Neg Hx    Angioedema Neg Hx    Eczema Neg Hx    Immunodeficiency Neg Hx    Urticaria Neg Hx     SOCIAL HISTORY: Social History   Socioeconomic History   Marital status: Married  Spouse name: Not on file   Number of children: Not on file   Years of education: Not on file   Highest education level: Not on file  Occupational History   Not on file  Tobacco Use   Smoking status: Never   Smokeless tobacco: Never  Vaping Use   Vaping Use: Never used  Substance and Sexual Activity   Alcohol use: No    Alcohol/week: 0.0 standard drinks    Comment: occ   Drug use: No   Sexual activity: Yes    Partners: Male    Birth control/protection: None  Other Topics Concern   Not on file  Social History Narrative   Not on file   Social Determinants of Health   Financial Resource Strain: Not on file  Food Insecurity: Not on file  Transportation Needs: Not on file  Physical Activity: Not on file  Stress: Not on file  Social Connections: Not on file  Intimate Partner Violence: Not on file      Marcial Pacas, M.D. Ph.D.  [facility] [address], [place], [place] [address] Ph: [phone] Fax: [phone]  CC:  Lahoma Rocker, MD [address] [place],  [place] [address]  Antony Contras, MD

## 2021-06-04 ENCOUNTER — Telehealth: Payer: Self-pay | Admitting: Neurology

## 2021-06-04 NOTE — Telephone Encounter (Signed)
[facility] pending faxed notes  [facility] auth: [id number] (exp. 06/05/21 to 07/05/21)

## 2021-06-05 NOTE — Telephone Encounter (Signed)
Patient left a voicemail on my phone she wanted to go to [facility]. She is scheduled for 06/09/21.

## 2021-06-05 NOTE — Telephone Encounter (Signed)
[facility] auth:  Brain: [id number] (exp. 06/04/21 to 08/03/21)  Cervical: [id number] (exp. 06/04/21 to 08/03/21)  Order sent to GI, they will reach out to the patient to schedule.

## 2021-06-09 ENCOUNTER — Ambulatory Visit (HOSPITAL_COMMUNITY)
Admission: RE | Admit: 2021-06-09 | Discharge: 2021-06-09 | Disposition: A | Discharge status: Home / Self Care | Payer: No Typology Code available for payment source | Source: Ambulatory Visit | Attending: Neurology | Admitting: Neurology

## 2021-06-09 DIAGNOSIS — R292 Abnormal reflex: Secondary | ICD-10-CM | POA: Diagnosis present

## 2021-06-09 DIAGNOSIS — R202 Paresthesia of skin: Secondary | ICD-10-CM | POA: Insufficient documentation

## 2021-06-09 IMAGING — MR MR CERVICAL SPINE W/O CM
5 series · 33 of 48 positions shown · non-contrast
Comparison: None.

CLINICAL DATA: Numbness or tingling, paresthesia

EXAM:
MRI CERVICAL SPINE WITHOUT CONTRAST
TECHNIQUE: Multiplanar, multisequence MR imaging of the cervical spine was
performed. No intravenous contrast was administered.

[Series 5: T1 · sagittal · 3.0mm · 0.69mm/px · 6 of 15 slices shown (1 of 2)]
[im 1/15]
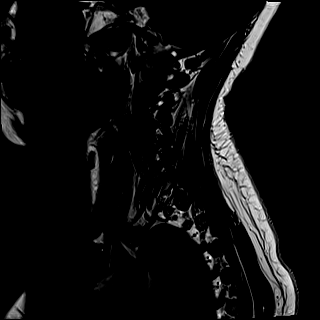
[im 3/15]
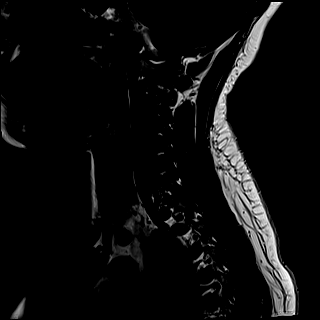
[im 6/15]
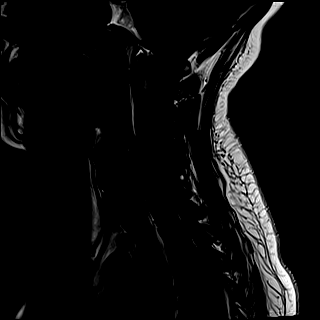
[im 9/15]
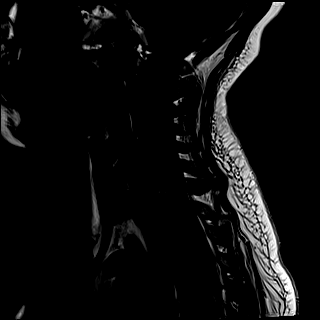
[im 12/15]
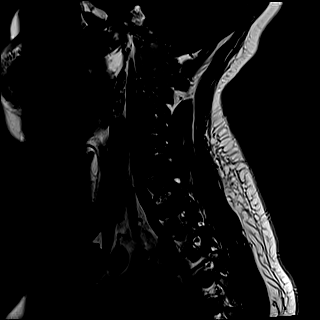
[im 15/15]
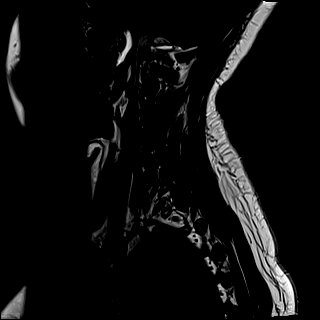

[Series 6: STIR · sagittal · 3.0mm · 0.86mm/px · 6 of 15 slices shown]
[im 1/15]
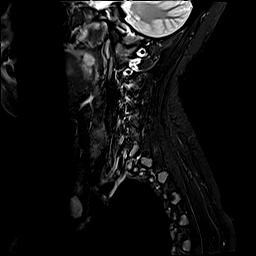
[im 3/15]
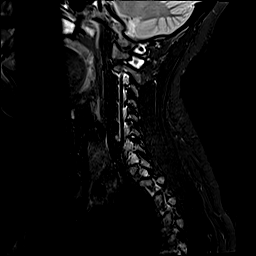
[im 6/15]
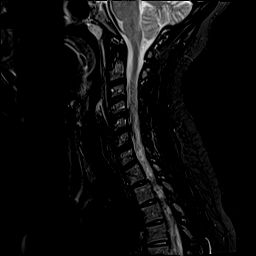
[im 9/15]
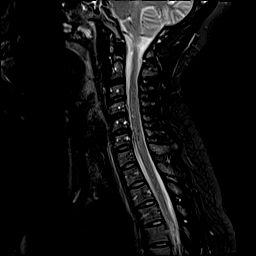
[im 12/15]
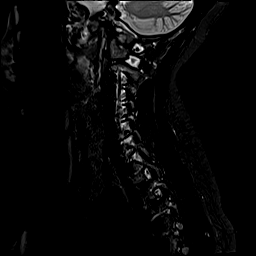
[im 15/15]
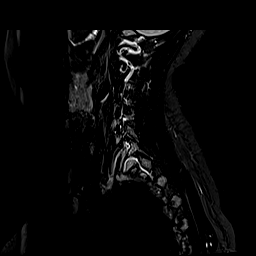

[Series 7: T2 · axial · 3.0mm · 0.70mm/px · z∈[-180,-93]mm · 9 of 27 slices shown]
[im 1/27]
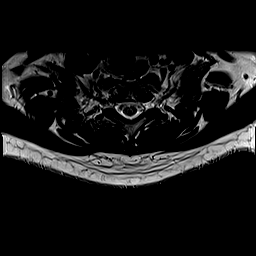
[im 5/27]
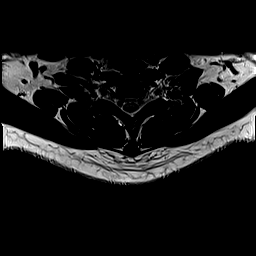
[im 8/27]
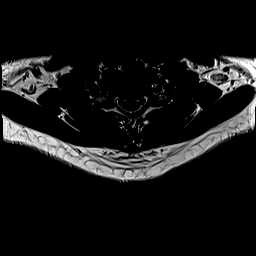
[im 12/27]
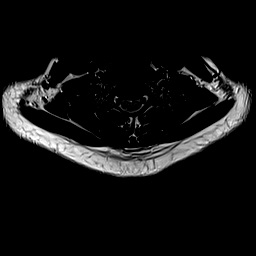
[im 15/27]
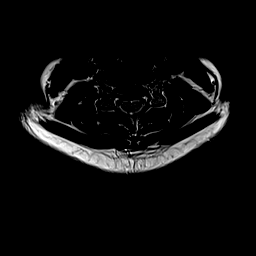
[im 19/27]
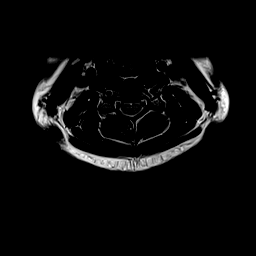
[im 22/27]
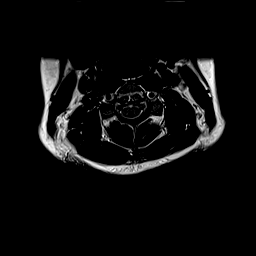
[im 24/27]
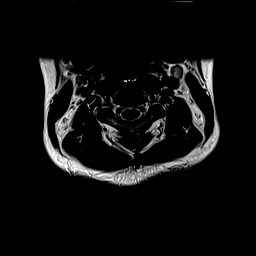
[im 27/27]
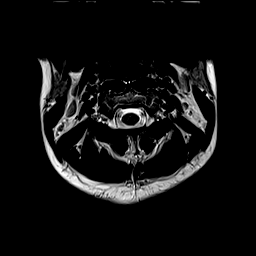

[Series 8: GRE · axial · 3.0mm · 0.35mm/px · z∈[-180,-156]mm · 3 of 27 slices shown]
[im 1/27]
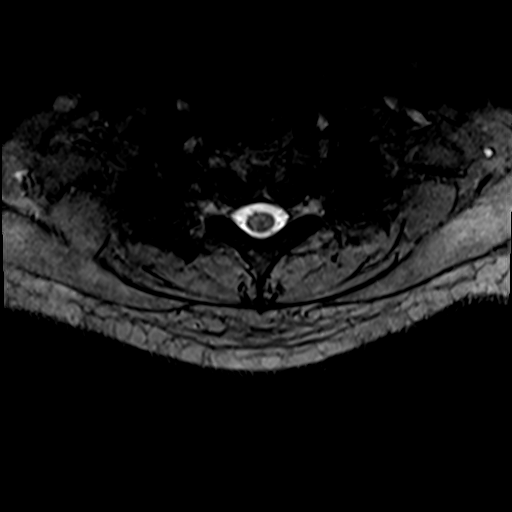
[im 5/27]
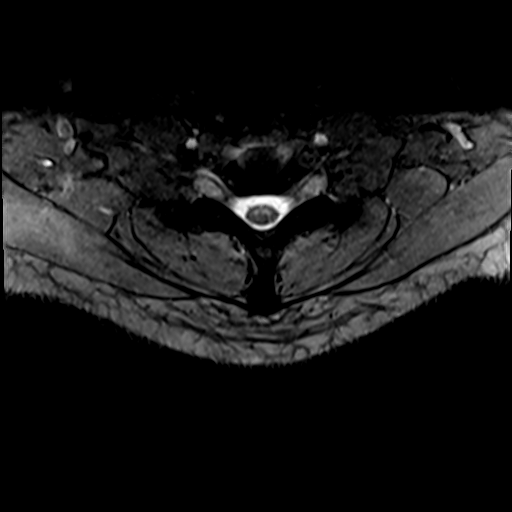
[im 8/27]
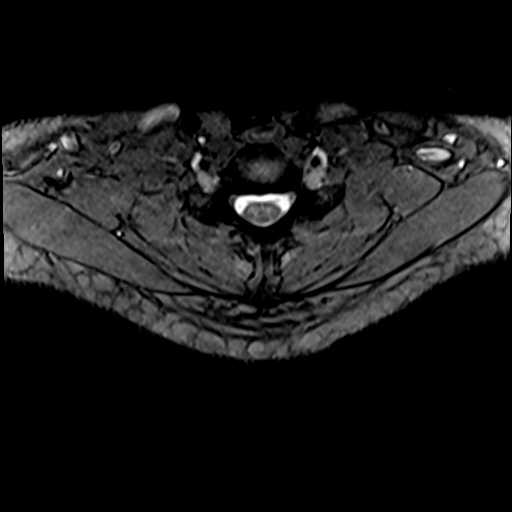

[Series 9: T1 · axial · 3.0mm · 0.35mm/px · z∈[-180,-93]mm · 9 of 27 slices shown (2 of 2)]
[im 1/27]
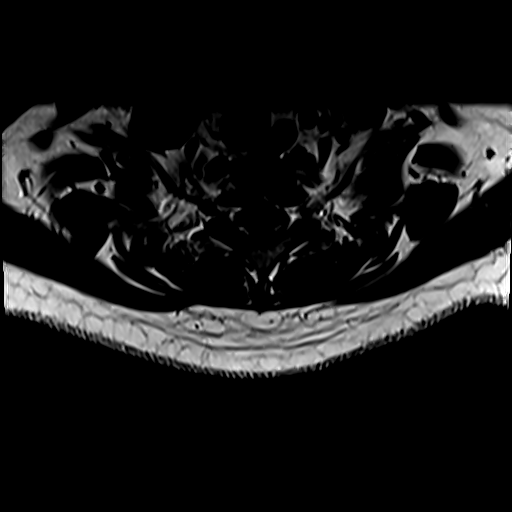
[im 5/27]
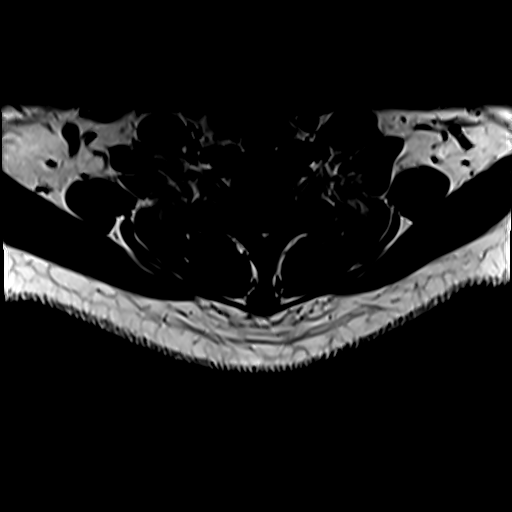
[im 8/27]
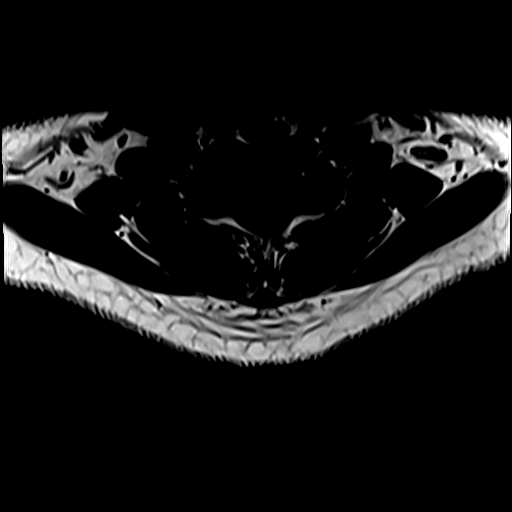
[im 12/27]
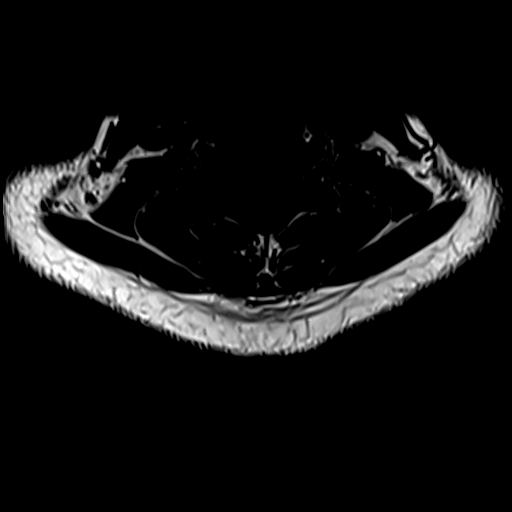
[im 15/27]
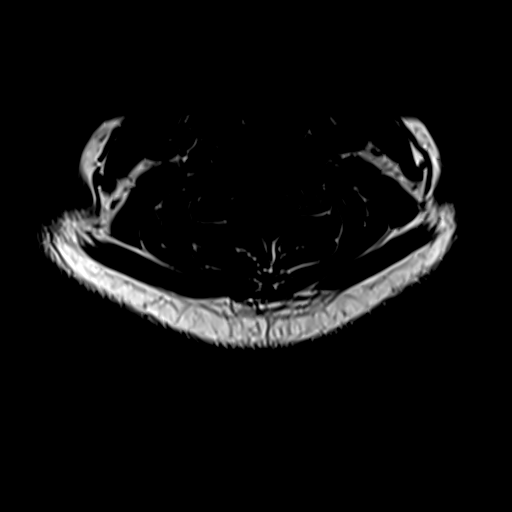
[im 19/27]
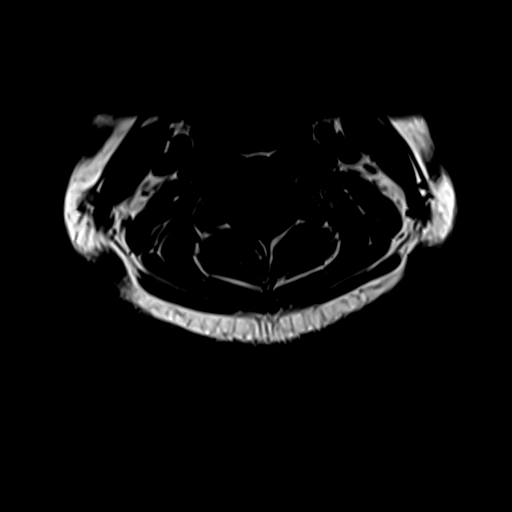
[im 22/27]
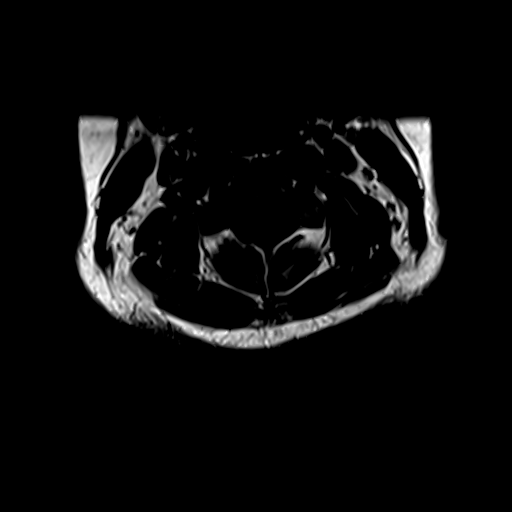
[im 24/27]
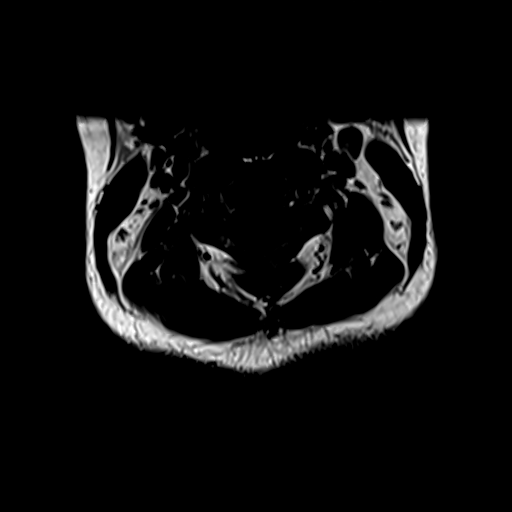
[im 27/27]
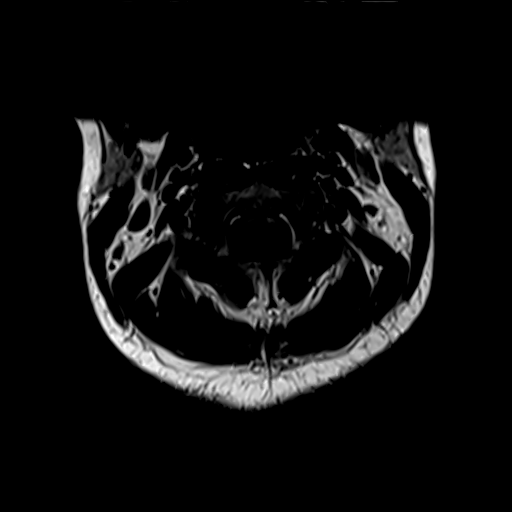

[33 of 48 positions shown; findings below may reference images not displayed]

FINDINGS: Alignment: Physiologic

Vertebrae: No fracture, evidence of discitis, or bone lesion.

Cord: Normal signal and morphology.  Normal flow voids.

Posterior Fossa, vertebral arteries, paraspinal tissues: Negative

Disc levels:

Small annular fissures with disc protrusion at C4-5 and C5-6.
Negative facets. No neural compression.
IMPRESSION: 1. Small noncompressive disc protrusions at C4-5 and C5-6. No
impingement throughout the cervical spine.
2. Normal appearance of the cord.
3. Omitted sagittal T2 weighted imaging which would likely be
noncontributory given the available images, although the patient can
return for this series if needed clinically.

## 2021-06-09 IMAGING — MR MR HEAD W/O CM
10 series · 48 of 48 positions shown · non-contrast
Comparison: Report from head CT [DATE] (images unavailable).

CLINICAL DATA: Provided history: Paresthesia. Hyper reflexia.
Numbness or tingling, paresthesia.

EXAM:
MRI HEAD WITHOUT CONTRAST
TECHNIQUE: Multiplanar, multiecho pulse sequences of the brain and surrounding
structures were obtained without intravenous contrast.

[Series 5: DWI · axial · 3.0mm · 1.36mm/px · z∈[-65,+75]mm · 8 of 96 slices shown (1 of 2)]
[im 1/96]
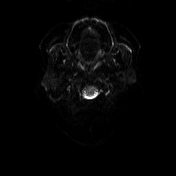
[im 14/96]
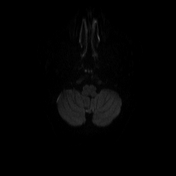
[im 28/96]
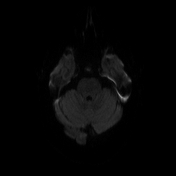
[im 41/96]
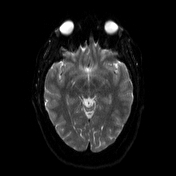
[im 55/96]
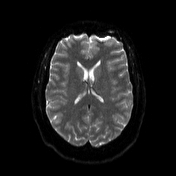
[im 68/96]
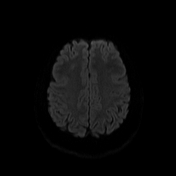
[im 82/96]
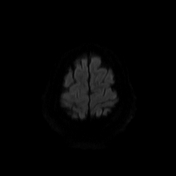
[im 96/96]
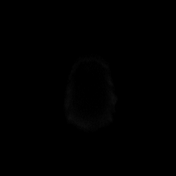

[Series 6: DWI · axial · 3.0mm · 1.36mm/px · z∈[-65,+75]mm · 4 of 48 slices shown (2 of 2)]
[im 1/48]
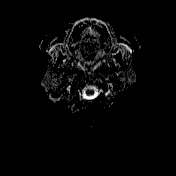
[im 16/48]
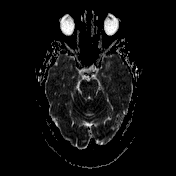
[im 32/48]
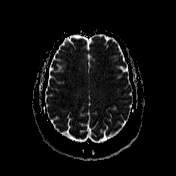
[im 48/48]
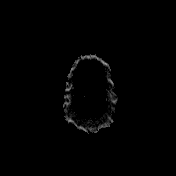

[Series 7: T1 · sagittal · 5.0mm · 0.75mm/px · 2 of 24 slices shown (1 of 2)]
[im 1/24]
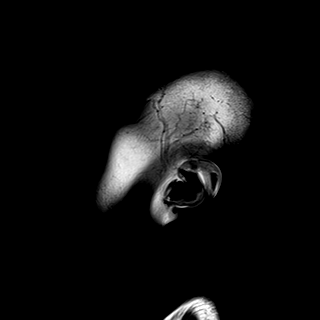
[im 24/24]
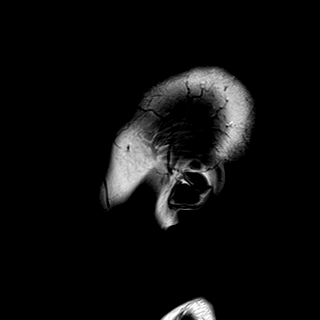

[Series 8: T2 · axial · 5.0mm · 0.62mm/px · z∈[-69,+79]mm · 2 of 24 slices shown (1 of 2)]
[im 1/24]
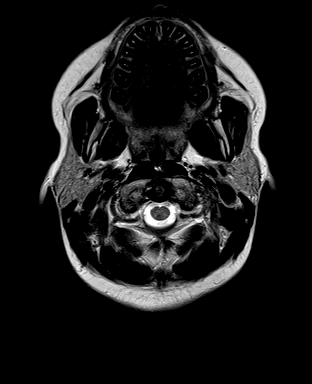
[im 24/24]
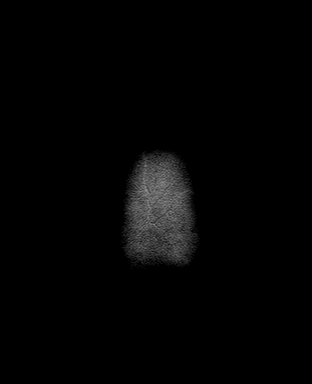

[Series 9: swi_images · axial · 3.0mm · 0.75mm/px · z∈[-100,+111]mm · 6 of 72 slices shown]
[im 1/72]
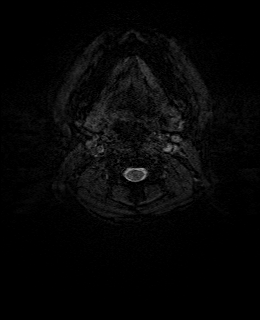
[im 15/72]
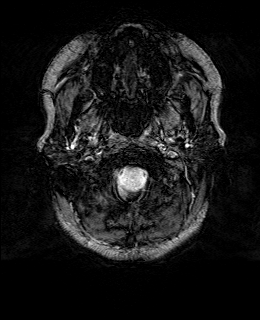
[im 29/72]
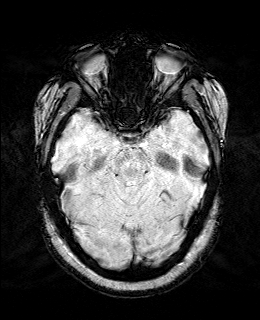
[im 43/72]
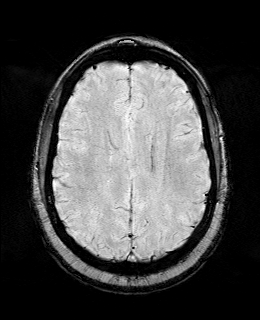
[im 57/72]
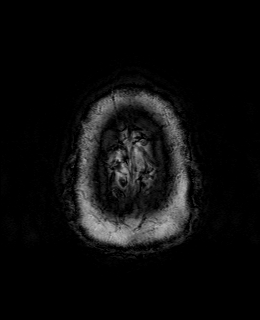
[im 72/72]
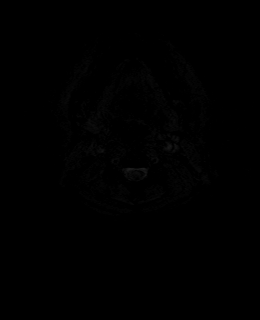

[Series 11: FLAIR · axial · 3.0mm · 0.75mm/px · z∈[-71,+81]mm · 4 of 52 slices shown]
[im 1/52]
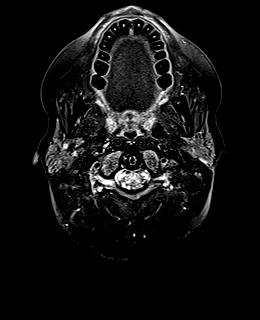
[im 18/52]
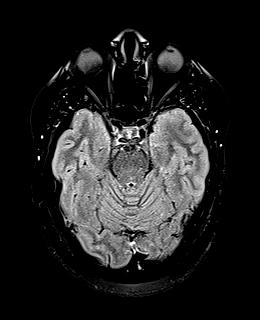
[im 35/52]
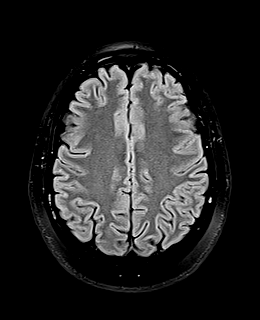
[im 52/52]
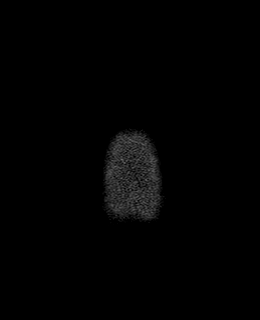

[Series 12: T1 · axial · 1.0mm · 0.94mm/px · z∈[-66,+76]mm · 12 of 144 slices shown (2 of 2)]
[im 1/144]
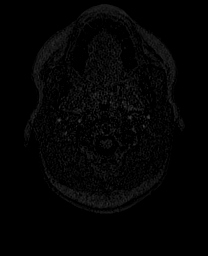
[im 14/144]
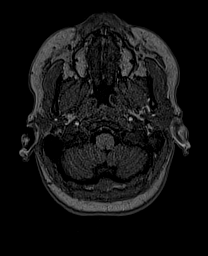
[im 27/144]
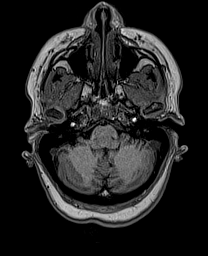
[im 40/144]
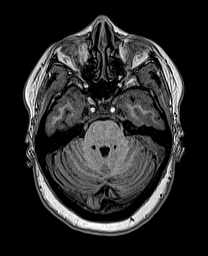
[im 53/144]
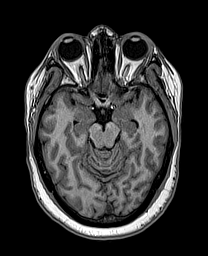
[im 66/144]
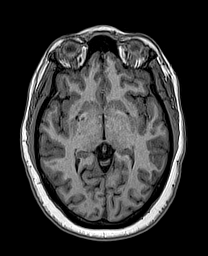
[im 79/144]
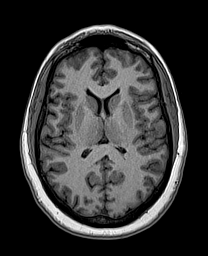
[im 92/144]
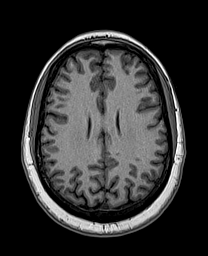
[im 105/144]
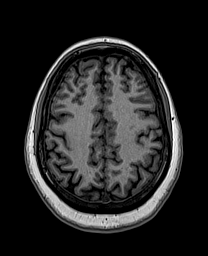
[im 118/144]
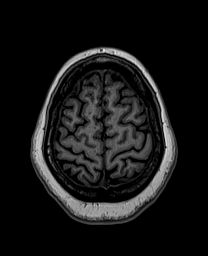
[im 131/144]
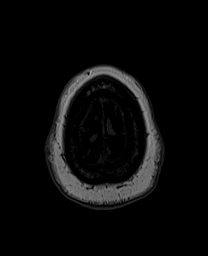
[im 144/144]
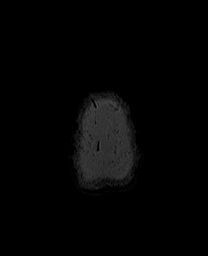

[Series 13: cor dwi_tracew · coronal · 5.0mm · 1.53mm/px · 5 of 60 slices shown]
[im 1/60]
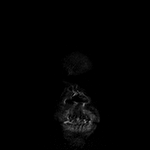
[im 15/60]
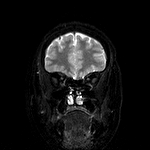
[im 30/60]
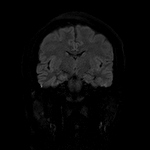
[im 45/60]
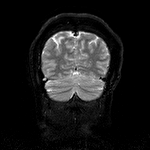
[im 60/60]
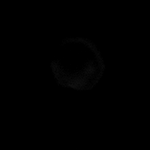

[Series 14: cor dwi_adc · coronal · 5.0mm · 1.53mm/px · 2 of 29 slices shown]
[im 1/29]
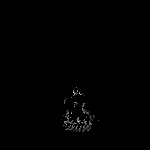
[im 29/29]
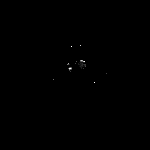

[Series 15: T2 · coronal · 5.0mm · 0.57mm/px · 3 of 36 slices shown (2 of 2)]
[im 1/36]
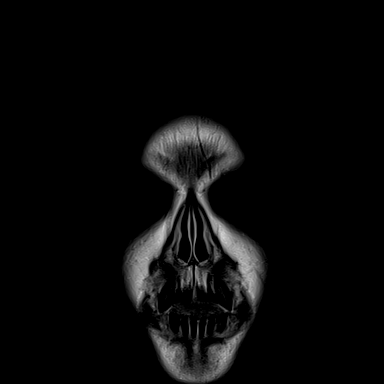
[im 18/36]
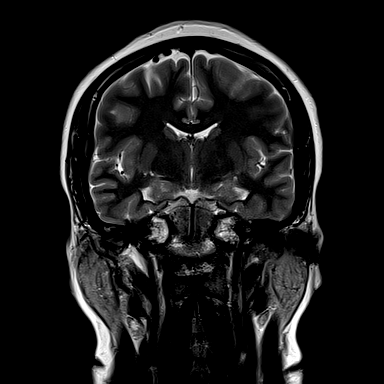
[im 36/36]
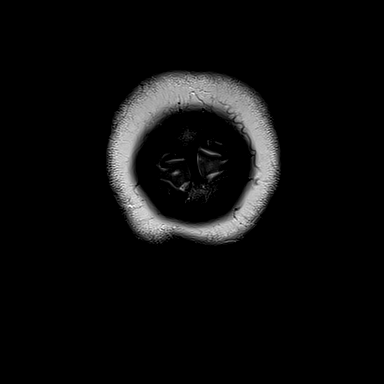

[48 of 48 positions shown; findings below may reference images not displayed]

FINDINGS: Brain:

Cerebral volume is normal.

Nonspecific punctate foci of T2 FLAIR hyperintense signal
abnormality within the high frontal lobe white matter, bilaterally
(2 foci total) (series 11, images 38 and 39).

No cortical encephalomalacia is identified.

There is no acute infarct.

No evidence of an intracranial mass.

No chronic intracranial blood products.

No extra-axial fluid collection.

No midline shift.

Vascular: Maintained flow voids within the proximal large arterial
vessels.

Skull and upper cervical spine: No focal suspicious marrow lesion.

Sinuses/Orbits: Visualized orbits show no acute finding. Trace
mucosal thickening within the left sphenoid sinus.
IMPRESSION: No evidence of acute intracranial abnormality.

Nonspecific punctate T2 FLAIR hyperintense remote insults within the
high frontal lobe white matter, bilaterally (2 foci total).

Otherwise unremarkable non-contrast MRI appearance of the brain.

## 2021-06-11 ENCOUNTER — Telehealth: Payer: Self-pay | Admitting: Neurology

## 2021-06-11 DIAGNOSIS — R202 Paresthesia of skin: Secondary | ICD-10-CM

## 2021-06-11 DIAGNOSIS — R292 Abnormal reflex: Secondary | ICD-10-CM

## 2021-06-11 NOTE — Telephone Encounter (Signed)
Pt called wanting to know the update on her MR CERVICAL SPINE WO CONTRAST results

## 2021-06-12 ENCOUNTER — Other Ambulatory Visit: Payer: Self-pay | Admitting: Neurology

## 2021-06-12 ENCOUNTER — Other Ambulatory Visit (HOSPITAL_COMMUNITY): Payer: Self-pay

## 2021-06-12 DIAGNOSIS — R202 Paresthesia of skin: Secondary | ICD-10-CM

## 2021-06-12 MED ORDER — DULOXETINE HCL 60 MG PO CPEP
60.0000 mg | ORAL_CAPSULE | Freq: Every day | ORAL | 0 refills | Status: DC
  Filled 2021-06-12: qty 30, 30d supply, fill #0

## 2021-06-12 NOTE — Progress Notes (Signed)
Orders Placed This Encounter  Procedures   CK   TSH   C-reactive protein   Sedimentation rate   ANA w/Reflex if Positive   CBC With Differential   Comprehensive metabolic panel   VITAMIN D 25 Hydroxy (Vit-D Deficiency, Fractures)

## 2021-06-12 NOTE — Telephone Encounter (Signed)
IMPRESSION: 1. Small noncompressive disc protrusions at C4-5 and C5-6. No impingement throughout the cervical spine. 2. Normal appearance of the cord. 3. Omitted sagittal T2 weighted imaging which would likely be noncontributory given the available images, although the patient can return for this series if needed clinically.    IMPRESSION: No evidence of acute intracranial abnormality.   Nonspecific punctate T2 FLAIR hyperintense remote insults within the high frontal lobe white matter, bilaterally (2 foci total).   Otherwise unremarkable non-contrast MRI appearance of the brain.  Please call patient, MRI of the brain showed no significant abnormality, cervical spine showed mild degenerative changes no evidence of the spinal cord or nerve root compression

## 2021-06-12 NOTE — Addendum Note (Signed)
Addended by: Lindell Spar C on: 06/12/2021 04:50 PM   Modules accepted: Orders

## 2021-06-12 NOTE — Telephone Encounter (Signed)
Pt verified by name and DOB,  normal results given per provider, pt voiced understanding all question answered.   Pt would like to know next step, Do you want NCS?

## 2021-06-12 NOTE — Telephone Encounter (Signed)
From reviewing initial consultation in November 2022, her main complaints is deep achy pain from hip down, numbness itching sensation  May consider neuropathic pain medication such as Cymbalta 60 mg daily, gabapentin 377m 3 times a day, Lyrica 75 3 times a day  May even consider add on more laboratory evaluation such as ANA CPK, ESR C-reactive protein,

## 2021-06-12 NOTE — Telephone Encounter (Signed)
Per the patient, she just had extensive blood work through her rheumatologist. Dr. Terrace Arabia has reviewed and will not repeat additional labs at this time. Dr. Terrace Arabia is agreeable to getting her on the schedule for NCV/EMG. Appt on 08/21/21 and put on wait list.  The patient would like rx for duloxetine 60mg , one capsule daily sent to the pharmacy. She would like them to place it on hold. She would will think about starting it over the holiday and wants it available to her.

## 2021-06-17 ENCOUNTER — Other Ambulatory Visit (HOSPITAL_COMMUNITY): Payer: Self-pay

## 2021-06-17 MED ORDER — VALACYCLOVIR HCL 1 G PO TABS
1000.0000 mg | ORAL_TABLET | Freq: Every day | ORAL | 3 refills | Status: DC
  Filled 2021-06-17: qty 30, 30d supply, fill #0
  Filled 2021-07-18: qty 90, 90d supply, fill #1

## 2021-06-18 ENCOUNTER — Telehealth: Payer: Self-pay | Admitting: Neurology

## 2021-06-18 NOTE — Telephone Encounter (Signed)
Pt was called to offer an earlier appointment for NCS/EMG.  Pt confirmed she has Covid-19 but would like to remain on the wait list.  FYI no call back requested.

## 2021-06-23 ENCOUNTER — Other Ambulatory Visit: Payer: No Typology Code available for payment source

## 2021-07-18 ENCOUNTER — Other Ambulatory Visit (HOSPITAL_COMMUNITY): Payer: Self-pay

## 2021-08-21 ENCOUNTER — Other Ambulatory Visit: Payer: Self-pay

## 2021-08-21 ENCOUNTER — Ambulatory Visit (INDEPENDENT_AMBULATORY_CARE_PROVIDER_SITE_OTHER): Payer: No Typology Code available for payment source | Admitting: Neurology

## 2021-08-21 ENCOUNTER — Other Ambulatory Visit (HOSPITAL_COMMUNITY): Payer: Self-pay

## 2021-08-21 DIAGNOSIS — R5383 Other fatigue: Secondary | ICD-10-CM

## 2021-08-21 DIAGNOSIS — R202 Paresthesia of skin: Secondary | ICD-10-CM

## 2021-08-21 DIAGNOSIS — R0683 Snoring: Secondary | ICD-10-CM | POA: Insufficient documentation

## 2021-08-21 DIAGNOSIS — G4710 Hypersomnia, unspecified: Secondary | ICD-10-CM | POA: Diagnosis not present

## 2021-08-21 MED ORDER — DULOXETINE HCL 30 MG PO CPEP
30.0000 mg | ORAL_CAPSULE | Freq: Every day | ORAL | 0 refills | Status: DC
  Filled 2021-08-21: qty 30, 30d supply, fill #0

## 2021-08-21 NOTE — Progress Notes (Signed)
ASSESSMENT AND PLAN  Andrea Dean is a 36 y.o. female   Few months history of intermittent upper and lower extremity paresthesia  Excessive fatigue, intermittent weakness, Loud snoring, excessive daytime sleepiness,  Extensive imaging study, laboratory evaluations, EMG nerve conduction study failed to demonstrate etiology,  Essentially normal neurological examinations, she does have draped soft palate, narrow oropharyngeal space, is at high risk for obstructive sleep apnea  Referred for sleep study  Empirically treat with Cymbalta 30 mg, titrating to 60 mg daily   DIAGNOSTIC DATA (LABS, IMAGING, TESTING) - I reviewed patient records, labs, notes, testing and imaging myself where available. Laboratory evaluation in October 2022 from rheumatologist, negative angiotensin-converting enzyme, ANA, CCP antibody, C3, C4 was slightly elevated, CPK was 112, C-reactive protein 0.81, normal ESR of 9, HLA-B27 was negative, hepatitis B core antibody was negative, surface antigen was negative, surface antibody was present 351, negative hepatitis C, myeloperoxidase antibody was negative, TB was negative, normal CBC hemoglobin 14, uric acid 5.0, normal CMP, creatinine of 0.72 B12 498,   MEDICAL HISTORY:  Andrea Dean is a 36 year old female, seen in request by her rheumatologist Dr. Lahoma Rocker, for evaluation of paresthesia all 4 limbs, her primary care physician is Dr. Antony Contras, initial evaluation was on May 27, 2021  I reviewed and summarized the referring note. PMHX.  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  Shortly after her COVID infection in January 2022, she began to noticed bilateral lower extremity deep achy pain from  hip down, she described numbness tingling itchy sensation, also has upper extremity involvement, burning sensation, getting worse over the past few months, she also noticed difficulty with prolonged walking such as going up and down steps, she felt limb heaviness,  She complains of lack of sleep, low back pain,  Personally reviewed MRI of lumbar spine in August 2022 that was normal  Update August 21, 2021, Patient returns for electrodiagnostic study today, which was essentially normal, there is no evidence of intrinsic muscle disease, right lumbar, cervical radiculopathy, no evidence of large fiber peripheral neuropathy  She continues complaints of intermittent generalized weakness, excessive fatigue, sleepiness, she does works hard, has 4 children, also complains of loud snoring, narrow pharyngeal space, high risk for obstructive sleep apnea  We personally reviewed MRI of the brain in November 22, no significant abnormality, MRI of cervical spine, mild degenerative changes, no evidence of canal or foraminal narrowing  Extensive laboratory evaluation by rheumatologist in October 2022 showed normal CPK, inflammatory markers such as ANA, C-reactive protein, ESR, and other more extensive test.  PHYSICAL EXAM:   There were no vitals filed for this visit.  Not recorded     There is no height or weight on file to calculate BMI.  PHYSICAL EXAMNIATION:  Gen: NAD, conversant, well nourised, well groomed      NEUROLOGICAL EXAM:  MENTAL STATUS: Speech/cognition: Awake, alert, oriented to history taking and casual conversation   CRANIAL NERVES: CN II: Visual fields are full to confrontation. Pupils are round equal and briskly reactive to light. CN III, IV, VI: extraocular movement are normal. No ptosis. CN V: Facial sensation is intact to light touch CN VII: Face is symmetric with normal eye closure  CN VIII: Hearing is normal  to causal conversation. CN IX, X: Phonation is normal. CN  XI: Head turning and shoulder shrug are intact CN XII: draped soft palate  MOTOR: There is no pronator drift of out-stretched arms. Muscle bulk and tone are normal. Muscle strength is normal.  REFLEXES: Reflexes are 2 and symmetric at the biceps, triceps, knees, and ankles. Plantar responses are flexor bilaterally SENSORY: Intact to light touch, pinprick and vibratory sensation are intact in fingers and toes.  COORDINATION: There is no trunk or limb dysmetria noted.  GAIT/STANCE: Posture is normal. Gait is steady with normal steps   REVIEW OF SYSTEMS:  Full 14 system review of systems performed and notable only for as above All other review of systems were negative.   ALLERGIES: Allergies  Allergen Reactions   Flagyl [Metronidazole Hcl] Hives and Nausea And Vomiting   Latex     sensitive    HOME MEDICATIONS: Current Outpatient Medications  Medication Sig Dispense Refill   cetirizine (ZYRTEC) 10 MG tablet Take 1 tablet (10 mg total) by mouth daily. 30 tablet 0   DULoxetine (CYMBALTA) 60 MG capsule Take 1 capsule (60 mg total) by mouth daily. 30 capsule 0   hydrOXYzine (ATARAX/VISTARIL) 25 MG tablet Take 1 tablet (25 mg total) by mouth every 6 (six) hours as needed for itching. 12 tablet 0   naproxen (NAPROSYN) 500 MG tablet Take 1 tablet (500 mg total) by mouth 2 (two) times daily with a meal. 30 tablet 0   predniSONE (STERAPRED UNI-PAK 21 TAB) 10 MG (21) TBPK tablet Taper once a day as directed 6-5-4-3-2-1 21 each 0   valACYclovir (VALTREX) 1000 MG tablet Take 1 tablet (1,000 mg total) by mouth daily. 30 tablet 11   valACYclovir (VALTREX) 1000 MG tablet Take 1 tablet (1,000 mg total) by mouth daily. 90 tablet 3   Vitamin D, Ergocalciferol, (DRISDOL) 1.25 MG (50000 UNIT) CAPS capsule Take 1 capsule (50,000 Units total) by mouth once a week. 13 capsule 3   No current facility-administered medications for this visit.    PAST MEDICAL HISTORY: Past Medical History:   Diagnosis Date   Abnormal Pap smear    repeat WNL   Chlamydia    Fracture closed, fibula, shaft 2007   left, no surgical repair   GERD (gastroesophageal reflux disease)    H/O seasonal allergies    Herpes genitalis in women    MRSA (methicillin resistant staph aureus) culture positive    Ovarian cyst    Preterm labor    Thrombocytopenia ([place])    2006 DURING PREGNANCY - NO PROBLEMS WITH DELIVERY OR ANY KNOWN PROBLEMS SINCE   Urinary tract infection    Urticaria     PAST SURGICAL HISTORY: Past Surgical History:  Procedure Laterality Date   CESAREAN SECTION     I & D OF PILONIADAL CYST IN OFFICE - LOCAL - FELT JITTERY AFTERWARDS     PILONIDAL CYST EXCISION N/A 05/05/2014   Procedure:  EXCISION PILONIDAL CYST;  Surgeon: Leighton Ruff, MD;  Location: [facility];  Service: General;  Laterality: N/A;   WISDOM TOOTH EXTRACTION  2004    FAMILY HISTORY: Family History  Problem Relation Age of Onset   Cancer Paternal Grandmother        brain, lung   Diabetes Paternal Grandmother    Heart disease Paternal Grandmother    Hypertension Paternal Grandmother    Hypertension Maternal Grandmother    Stroke Maternal Grandmother    Anesthesia problems Neg Hx    Hypotension Neg Hx  Malignant hyperthermia Neg Hx    Pseudochol deficiency Neg Hx    Allergic rhinitis Neg Hx    Asthma Neg Hx    Angioedema Neg Hx    Eczema Neg Hx    Immunodeficiency Neg Hx    Urticaria Neg Hx     SOCIAL HISTORY: Social History   Socioeconomic History   Marital status: Married    Spouse name: Not on file   Number of children: Not on file   Years of education: Not on file   Highest education level: Not on file  Occupational History   Not on file  Tobacco Use   Smoking status: Never   Smokeless tobacco: Never  Vaping Use   Vaping Use: Never used  Substance and Sexual Activity   Alcohol use: No    Alcohol/week: 0.0 standard drinks    Comment: occ   Drug use: No   Sexual activity: Yes     Partners: Male    Birth control/protection: None  Other Topics Concern   Not on file  Social History Narrative   Not on file   Social Determinants of Health   Financial Resource Strain: Not on file  Food Insecurity: Not on file  Transportation Needs: Not on file  Physical Activity: Not on file  Stress: Not on file  Social Connections: Not on file  Intimate Partner Violence: Not on file      Marcial Pacas, M.D. Ph.D.  [facility] [address], [place], [place] [address] Ph: [phone] Fax: [phone]  CC:  Antony Contras, MD [address] [place],  [place] [address]  Antony Contras, MD

## 2021-08-21 NOTE — Procedures (Signed)
Full Name: Andrea Dean Gender: Female MRN #: [id number] Date of Birth: 08-25-1985    Visit Date: 08/21/2021 08:44 Age: 36 Years Examining Physician: Levert Feinstein, MD  Referring Physician: Levert Feinstein, MD Height: 5 feet 3 inch Patient History: 36 year old female complains of few month history of generalized fatigue, intermittent paresthesia  Summary of the test: Nerve conduction study: Right sural, superficial median, ulnar sensory responses right tibial, peroneal to EDB, median and ulnar motor responses were normal.  Electromyography: Selective needle examination of the right lower, upper extremity muscles, right lumbar, cervical paraspinal muscles were normal.    Conclusion: This is a normal study.  There is no electrodiagnostic evidence of large fiber peripheral neuropathy, right cervical, lumbosacral radiculopathy.  There is no evidence of intrinsic muscle disease.    ------------------------------- Levert Feinstein M.D. PhD  [facility] [address], [address] [place], [place] [address] Tel: [phone] Fax: [phone]  Verbal informed consent was obtained from the patient, patient was informed of potential risk of procedure, including bruising, bleeding, hematoma formation, infection, muscle weakness, muscle pain, numbness, among others.        MNC    Nerve / Sites Muscle Latency Ref. Amplitude Ref. Rel Amp Segments Distance Velocity Ref. Area    ms ms mV mV %  cm m/s m/s mVms  R Median - APB     Wrist APB 3.4 ?4.4 13.2 ?4.0 100 Wrist - APB 7   49.2     Upper arm APB 6.7  13.1  99.1 Upper arm - Wrist 20 60 ?49 48.0  R Ulnar - ADM     Wrist ADM 2.8 ?3.3 14.0 ?6.0 100 Wrist - ADM 7   54.0     B.Elbow ADM 5.5  14.0  100 B.Elbow - Wrist 18 66 ?49 53.8     A.Elbow ADM 7.0  14.0  99.8 A.Elbow - B.Elbow 10 66 ?49 53.0  R Peroneal - EDB     Ankle EDB 2.9 ?6.5 5.9 ?2.0 100 Ankle - EDB 9   15.3     Fib head EDB 8.3  5.5  93.8 Fib head - Ankle 29 54 ?44 14.4      Pop fossa EDB 10.1  5.3  96.8 Pop fossa - Fib head 10 54 ?44 13.8         Pop fossa - Ankle      R Tibial - AH     Ankle AH 2.9 ?5.8 9.7 ?4.0 100 Ankle - AH 9   21.9     Pop fossa AH 10.1  8.2  85.2 Pop fossa - Ankle 42 58 ?41 21.0             SNC    Nerve / Sites Rec. Site Peak Lat Ref.  Amp Ref. Segments Distance    ms ms V V  cm  R Sural - Ankle (Calf)     Calf Ankle 2.9 ?4.4 25 ?6 Calf - Ankle 14  R Superficial peroneal - Ankle     Lat leg Ankle 3.1 ?4.4 19 ?6 Lat leg - Ankle 14  R Median - Orthodromic (Dig II, Mid palm)     Dig II Wrist 2.8 ?3.4 35 ?10 Dig II - Wrist 13  R Ulnar - Orthodromic, (Dig V, Mid palm)     Dig V Wrist 2.5 ?3.1 23 ?5 Dig V - Wrist 11             F  Wave    Nerve F Lat Ref.   ms ms  R Tibial - AH 43.5 ?56.0  R Ulnar - ADM 24.9 ?32.0         EMG Summary Table    Spontaneous MUAP Recruitment  Muscle IA Fib PSW Fasc Other Amp Dur. Poly Pattern  R. Tibialis anterior Normal None None None _______ Normal Normal Normal Normal  R. Tibialis posterior Normal None None None _______ Normal Normal Normal Normal  R. Peroneus longus Normal None None None _______ Normal Normal Normal Normal  R. Gastrocnemius (Medial head) Normal None None None _______ Normal Normal Normal Normal  R. Vastus lateralis Normal None None None _______ Normal Normal Normal Normal  R. Lumbar paraspinals (mid) Normal None None None _______ Normal Normal Normal Normal  R. Lumbar paraspinals (low) Normal None None None _______ Normal Normal Normal Normal  R. First dorsal interosseous Normal None None None _______ Normal Normal Normal Normal  R. Pronator teres Normal None None None _______ Normal Normal Normal Normal  R. Biceps brachii Normal None None None _______ Normal Normal Normal Normal  R. Deltoid Normal None None None _______ Normal Normal Normal Normal  R. Triceps brachii Normal None None None _______ Normal Normal Normal Normal  R. Thoracic paraspinals Normal None None None  _______ Normal Normal Normal Normal

## 2021-08-22 LAB — COMPREHENSIVE METABOLIC PANEL
ALT: 8 IU/L (ref 0–32)
AST: 16 IU/L (ref 0–40)
Albumin/Globulin Ratio: 1.6 (ref 1.2–2.2)
Albumin: 4.6 g/dL (ref 3.8–4.8)
Alkaline Phosphatase: 48 IU/L (ref 44–121)
BUN/Creatinine Ratio: 12 (ref 9–23)
BUN: 9 mg/dL (ref 6–20)
Bilirubin Total: 0.3 mg/dL (ref 0.0–1.2)
CO2: 23 mmol/L (ref 20–29)
Calcium: 9.7 mg/dL (ref 8.7–10.2)
Chloride: 103 mmol/L (ref 96–106)
Creatinine, Ser: 0.77 mg/dL (ref 0.57–1.00)
Globulin, Total: 2.8 g/dL (ref 1.5–4.5)
Glucose: 67 mg/dL — ABNORMAL LOW (ref 70–99)
Potassium: 4.3 mmol/L (ref 3.5–5.2)
Sodium: 139 mmol/L (ref 134–144)
Total Protein: 7.4 g/dL (ref 6.0–8.5)
eGFR: 103 mL/min/{1.73_m2} (ref >59)

## 2021-08-22 LAB — CBC WITH DIFFERENTIAL
Basophils Absolute: 0.1 10*3/uL (ref 0.0–0.2)
Basos: 1 %
EOS (ABSOLUTE): 0.1 10*3/uL (ref 0.0–0.4)
Eos: 2 %
Hematocrit: 40 % (ref 34.0–46.6)
Hemoglobin: 13.2 g/dL (ref 11.1–15.9)
Immature Grans (Abs): 0 10*3/uL (ref 0.0–0.1)
Immature Granulocytes: 0 %
Lymphocytes Absolute: 2.6 10*3/uL (ref 0.7–3.1)
Lymphs: 49 %
MCH: 32.2 pg (ref 26.6–33.0)
MCHC: 33 g/dL (ref 31.5–35.7)
MCV: 98 fL — ABNORMAL HIGH (ref 79–97)
Monocytes Absolute: 0.3 10*3/uL (ref 0.1–0.9)
Monocytes: 6 %
Neutrophils Absolute: 2.2 10*3/uL (ref 1.4–7.0)
Neutrophils: 42 %
RBC: 4.1 x10E6/uL (ref 3.77–5.28)
RDW: 13.1 % (ref 11.7–15.4)
WBC: 5.2 10*3/uL (ref 3.4–10.8)

## 2021-08-22 LAB — SEDIMENTATION RATE: Sed Rate: 6 mm/hr (ref 0–32)

## 2021-08-22 LAB — ANA W/REFLEX IF POSITIVE: Anti Nuclear Antibody (ANA): NEGATIVE

## 2021-08-22 LAB — VITAMIN D 25 HYDROXY (VIT D DEFICIENCY, FRACTURES): Vit D, 25-Hydroxy: 51.3 ng/mL (ref 30.0–100.0)

## 2021-08-22 LAB — C-REACTIVE PROTEIN: CRP: 3 mg/L (ref 0–10)

## 2021-08-22 LAB — TSH: TSH: 0.816 u[IU]/mL (ref 0.450–4.500)

## 2021-08-22 LAB — CK: Total CK: 118 U/L (ref 32–182)

## 2021-08-23 LAB — ACETYLCHOLINE RECEPTOR AB, ALL
AChR Binding Ab, Serum: 0.05 nmol/L (ref 0.00–0.24)
Acetylchol Block Ab: 14 % (ref 0–25)

## 2021-08-29 ENCOUNTER — Other Ambulatory Visit (HOSPITAL_COMMUNITY): Payer: Self-pay

## 2021-09-04 ENCOUNTER — Other Ambulatory Visit: Payer: Self-pay | Admitting: Obstetrics

## 2021-09-04 ENCOUNTER — Other Ambulatory Visit (HOSPITAL_COMMUNITY): Payer: Self-pay

## 2021-09-04 DIAGNOSIS — B379 Candidiasis, unspecified: Secondary | ICD-10-CM

## 2021-09-04 DIAGNOSIS — N3000 Acute cystitis without hematuria: Secondary | ICD-10-CM

## 2021-09-04 MED ORDER — NITROFURANTOIN MONOHYD MACRO 100 MG PO CAPS
100.0000 mg | ORAL_CAPSULE | Freq: Two times a day (BID) | ORAL | 2 refills | Status: DC
  Filled 2021-09-04: qty 14, 7d supply, fill #0

## 2021-09-04 MED ORDER — FLUCONAZOLE 150 MG PO TABS
150.0000 mg | ORAL_TABLET | Freq: Once | ORAL | 2 refills | Status: AC
  Filled 2021-09-04: qty 1, 1d supply, fill #0
  Filled 2021-10-18: qty 1, 1d supply, fill #1

## 2021-09-11 ENCOUNTER — Other Ambulatory Visit (HOSPITAL_COMMUNITY): Payer: Self-pay

## 2021-09-11 MED ORDER — HYDROXYZINE HCL 25 MG PO TABS
25.0000 mg | ORAL_TABLET | Freq: Four times a day (QID) | ORAL | 1 refills | Status: DC | PRN
  Filled 2021-09-11: qty 30, 8d supply, fill #0

## 2021-09-12 ENCOUNTER — Other Ambulatory Visit (HOSPITAL_COMMUNITY): Payer: Self-pay

## 2021-09-12 ENCOUNTER — Encounter: Payer: Self-pay | Admitting: Internal Medicine

## 2021-09-12 ENCOUNTER — Other Ambulatory Visit: Payer: Self-pay

## 2021-09-12 ENCOUNTER — Ambulatory Visit (INDEPENDENT_AMBULATORY_CARE_PROVIDER_SITE_OTHER): Payer: No Typology Code available for payment source | Admitting: Internal Medicine

## 2021-09-12 VITALS — BP 112/60 | HR 101 | Temp 98.0°F | Resp 17 | Ht 63.0 in | Wt 153.0 lb

## 2021-09-12 DIAGNOSIS — L508 Other urticaria: Secondary | ICD-10-CM

## 2021-09-12 MED ORDER — CETIRIZINE HCL 10 MG PO TABS
20.0000 mg | ORAL_TABLET | Freq: Two times a day (BID) | ORAL | 5 refills | Status: DC
  Filled 2021-09-12 – 2021-11-07 (×2): qty 120, 30d supply, fill #0
  Filled 2022-01-07: qty 120, 30d supply, fill #1
  Filled 2022-03-16: qty 120, 30d supply, fill #2
  Filled 2022-05-17: qty 120, 30d supply, fill #3
  Filled 2022-07-18: qty 120, 30d supply, fill #4

## 2021-09-12 MED ORDER — TRIAMCINOLONE ACETONIDE 0.1 % EX OINT
TOPICAL_OINTMENT | Freq: Two times a day (BID) | CUTANEOUS | 1 refills | Status: DC | PRN
  Filled 2021-09-12: qty 80, 15d supply, fill #0

## 2021-09-12 MED ORDER — EPINEPHRINE 0.3 MG/0.3ML IJ SOAJ
0.3000 mg | Freq: Once | INTRAMUSCULAR | 2 refills | Status: AC
  Filled 2021-09-12: qty 2, 2d supply, fill #0

## 2021-09-12 NOTE — Progress Notes (Signed)
NEW PATIENT Date of Service/Encounter:  09/12/21 Referring provider: Tally Joe, MD Primary care provider: Tally Joe, MD  Subjective:  Andrea Dean is a 36 y.o. female with a PMHx of allergic rhinitis, heart palpitations, shortness of breath presenting today for evaluation of hives. History obtained from: chart review and patient.   Rash: started since August, occurring every day Associates itching Denies other systemic symptoms including no respiratory, gastrointestinal or cardiovascular distress. No changes to personal care products, detergents, diet etc Therapies tried: cetrizine as needed Potential triggers: showers, pressure Does not associate fever, joint swelling, weight loss.  Occasionally has joint pain since she had COVID 19 in January 2021 and again November 2022.  She has seen both Rheumatology and Neurology.  She has had extensive autoimmune work-up which has been normal. Lesions resolve in 30 minutes to  No bruising on resolution. She did have diarrhea and runny nose on Monday and hives started after this and again yesterday she had diarrhea and the morning and her hives flare was worse.  Did have hives on other days this past week without diarrhea. Denies other systemic symptoms and denies other sick symptoms.   Pictures reviewed and consistent with hives.  Of note, she is a previous patient of Dr. Beaulah Dean seen in 2016.  At that time evaluated for complaints of shortness of breath with normal spirometry and no response to bronchodilator.  Allergy testing at that visit showed positivity to weeds and mold.  Top 9 food allergen testing was negative.  Other allergy screening: Asthma: no Rhino conjunctivitis:  yes- zyrtec PRN, Spring time is worst Food allergy: no Medication allergy:  Flagyl-nausea, vomiting and hives Hymenoptera allergy: no Eczema:no  Past Medical History: Past Medical History:  Diagnosis Date   Abnormal Pap smear    repeat WNL   Chlamydia     Fracture closed, fibula, shaft 2007   left, no surgical repair   GERD (gastroesophageal reflux disease)    H/O seasonal allergies    Herpes genitalis in women    MRSA (methicillin resistant staph aureus) culture positive    Ovarian cyst    Preterm labor    Thrombocytopenia (HCC)    2006 DURING PREGNANCY - NO PROBLEMS WITH DELIVERY OR ANY KNOWN PROBLEMS SINCE   Urinary tract infection    Urticaria    Medication List:  Current Outpatient Medications  Medication Sig Dispense Refill   cetirizine (ZYRTEC) 10 MG tablet May take up to 2 tablets in morning and 2 tablets in evening for hives prevention and treatment. This is maximum dose. Titrate as needed. 120 tablet 5   EPINEPHrine (EPIPEN 2-PAK) 0.3 mg/0.3 mL IJ SOAJ injection Inject 0.3 mg into the muscle once for 1 dose. 2 each 2   hydrOXYzine (ATARAX) 25 MG tablet Take 1 tablet (25 mg total) by mouth every 6 (six) hours as needed for itching/hives 30 tablet 1   hydrOXYzine (ATARAX/VISTARIL) 25 MG tablet Take 1 tablet (25 mg total) by mouth every 6 (six) hours as needed for itching. 12 tablet 0   triamcinolone ointment (KENALOG) 0.1 % Apply topically twice daily to BODY as needed for red, sandpaper like rash.  Do not use on face, groin or armpits. 80 g 1   valACYclovir (VALTREX) 1000 MG tablet Take 1 tablet (1,000 mg total) by mouth daily. 30 tablet 11   Vitamin D, Ergocalciferol, (DRISDOL) 1.25 MG (50000 UNIT) CAPS capsule Take 1 capsule (50,000 Units total) by mouth once a week. (Patient taking differently: Take  50,000 Units by mouth once a week. Every other week) 13 capsule 3   nitrofurantoin, macrocrystal-monohydrate, (MACROBID) 100 MG capsule Take 1 capsule (100 mg total) by mouth 2 (two) times daily for 7 days (Patient not taking: Reported on 09/12/2021) 14 capsule 2   No current facility-administered medications for this visit.   Known Allergies:  Allergies  Allergen Reactions   Flagyl [Metronidazole Hcl] Hives and Nausea And  Vomiting   Latex     sensitive   Past Surgical History: Past Surgical History:  Procedure Laterality Date   CESAREAN SECTION     I & D OF PILONIADAL CYST IN OFFICE - LOCAL - FELT JITTERY AFTERWARDS     PILONIDAL CYST EXCISION N/A 05/05/2014   Procedure:  EXCISION PILONIDAL CYST;  Surgeon: Romie Levee, MD;  Location: [facility];  Service: General;  Laterality: N/A;   WISDOM TOOTH EXTRACTION  2004   Family History: Family History  Problem Relation Age of Onset   Cancer Paternal Grandmother        brain, lung   Diabetes Paternal Grandmother    Heart disease Paternal Grandmother    Hypertension Paternal Grandmother    Hypertension Maternal Grandmother    Stroke Maternal Grandmother    Anesthesia problems Neg Hx    Hypotension Neg Hx    Malignant hyperthermia Neg Hx    Pseudochol deficiency Neg Hx    Allergic rhinitis Neg Hx    Asthma Neg Hx    Angioedema Neg Hx    Eczema Neg Hx    Immunodeficiency Neg Hx    Urticaria Neg Hx    Social History: Andrea Dean lives in a house built 17 years ago, Advertising account executive, Architectural technologist, central AC, pet dog, no cockroaches, using dust mite protection on the bedding, works as an Charity fundraiser in the OB/GYN at [employer].  She denies smoking  ROS:  All other systems negative except as noted per HPI.  Objective:  Blood pressure 112/60, pulse (!) 101, temperature 98 F (36.7 C), temperature source Temporal, resp. rate 17, height 5\' 3"  (1.6 m), weight 153 lb (69.4 kg), SpO2 99 %. Body mass index is 27.1 kg/m. Physical Exam:  General Appearance:  Alert, cooperative, no distress, appears stated age  Head:  Normocephalic, without obvious abnormality, atraumatic  Eyes:  Conjunctiva clear, EOM's intact  Nose: Nares normal, hypertrophic turbinates, normal mucosa, no visible anterior polyps, and septum midline  Throat: Lips, tongue normal; teeth and gums normal, normal posterior oropharynx  Neck: Supple, symmetrical  Lungs:   clear to auscultation bilaterally,  Respirations unlabored, no coughing  Heart:  regular rate and rhythm and no murmur, Appears well perfused  Extremities: No edema  Skin: Skin color, texture, turgor normal, no rashes or lesions on visualized portions of skin  Neurologic: No gross deficits     Diagnostics: Skin Testing: Deferred due to recent antihistamines use.  Assessment and Plan  Her history and review of photos are consistent with chronic idiopathic urticaria.  Suspect that rhinorrhea and diarrhea of this week likely represent a viral illness causing a flare of hives though did explore the idea of diarrhea being secondary to excess histamine production.  As there is no way to rule out the latter, I am providing her with an epinephrine autoinjector.  We will check a tryptase for evaluation of mast cell burden.  Do not suspect either environmental or food allergies as a cause of her recurrent hives based on timing and duration of symptoms.  However, to provide patient  reassurance have offered to order blood testing for these as well.    Patient Instructions  Chronic Idiopathic Urticaria: - you do not have any red flag symptoms to make [place] concerned about secondary causes of hives, but we will screen for these for reassurance with: hive panel, alpha-gal panel, environmental panel, tryptase (marker of mast cells), food allergy panel (do not suspect food allergy)  Therapy Plan:  - start zyrtec (cetirizine) 10mg  once daily - if hives are uncontrolled, increase zyrtec (cetirizine) to 10mg  twice daily - if hives remain uncontrolled, increase dose of zyrtec (cetirizine) to max dose of 20mg  (2 pills) twice daily- this is maximum dose - can increase or decrease dosing depending on symptom control to a maximum dose of 4 tablets of antihistamine daily. Wait until hives free for at least one month prior to decreasing dose.   - if hives are still uncontrolled with the above regimen, please arrange an appointment for discussion of Xolair  (omalizumab)- an injectable medication for hives - an epipen prescription has been provided based on your history of hives and diarrhea along with an emergency action plan, if using this device, please seek immediate medical attention  Can use one of the following in place of zyrtec if desires: Claritin (loratadine) 10 mg, Xyzal (levocetirizine) 5 mg or Allegra (fexofenadine) 180 mg daily as needed  Chronic allergic rhinitis:  -Continue antihistamines as above - Repeating environmental allergy test, can update environmental avoidance measures once results available  Flagyl allergy - Strict avoidance  Follow-up in 6 to 8 weeks, sooner if needed. It was a pleasure meeting you today!     This note in its entirety was forwarded to the Provider who requested this consultation.  Thank you for your kind referral. I appreciate the opportunity to take part in Andrea Dean's care. Please do not hesitate to contact me with questions.  Sincerely,  , MD [facility]

## 2021-09-12 NOTE — Patient Instructions (Addendum)
Chronic Idiopathic Urticaria: - this is defined as hives lasting more than 6 weeks without an identifiable trigger - hives can be from a number of different sources including infections, allergies, vibration, temperature, pressure among many others other possible causes - often an identifiable cause is not determined - some potential triggers include: stress, illness, NSAIDs, aspirin, hormonal changes - you do not have any red flag symptoms to make [place] concerned about secondary causes of hives, but we will screen for these for reassurance with: hive panel, alpha-gal panel, environmental panel, tryptase (marker of mast cells), food allergy panel (do not suspect food allergy) - approximately 50% of patients with chronic hives can have some associated swelling of the face/lips/eyelids (this is not a cause for alarm and does not typically progress onto systemic allergic reactions)  Therapy Plan:  - start zyrtec (cetirizine) 10mg  once daily - if hives are uncontrolled, increase zyrtec (cetirizine) to 10mg  twice daily - if hives remain uncontrolled, increase dose of zyrtec (cetirizine) to max dose of 20mg  (2 pills) twice daily- this is maximum dose - can increase or decrease dosing depending on symptom control to a maximum dose of 4 tablets of antihistamine daily. Wait until hives free for at least one month prior to decreasing dose.   - if hives are still uncontrolled with the above regimen, please arrange an appointment for discussion of Xolair (omalizumab)- an injectable medication for hives - an epipen prescription has been provided based on your history of hives and diarrhea along with  Can use one of the following in place of zyrtec if desires: Claritin (loratadine) 10 mg, Xyzal (levocetirizine) 5 mg or Allegra (fexofenadine) 180 mg daily as needed   Follow-up in 6 to 8 weeks, sooner if needed. It was a pleasure meeting you today!

## 2021-09-18 LAB — FOOD ALLERGY PROFILE
Allergen Corn, IgE: <0.1 kU/L
Clam IgE: <0.1 kU/L
Codfish IgE: <0.1 kU/L
Egg White IgE: <0.1 kU/L
Milk IgE: <0.1 kU/L
Peanut IgE: <0.1 kU/L
Scallop IgE: <0.1 kU/L
Sesame Seed IgE: <0.1 kU/L
Shrimp IgE: <0.1 kU/L
Soybean IgE: <0.1 kU/L
Walnut IgE: <0.1 kU/L
Wheat IgE: <0.1 kU/L

## 2021-09-18 LAB — ALLERGENS, ZONE 2

## 2021-09-18 LAB — ALPHA-GAL PANEL
Allergen Lamb IgE: <0.1 kU/L
Beef IgE: <0.1 kU/L
IgE (Immunoglobulin E), Serum: 140 IU/mL (ref 6–495)
O215-IgE Alpha-Gal: <0.1 kU/L
Pork IgE: <0.1 kU/L

## 2021-09-18 LAB — TRYPTASE: Tryptase: 6 ug/L (ref 2.2–13.2)

## 2021-09-18 LAB — CHRONIC URTICARIA: cu index: <2.1 (ref <10)

## 2021-09-18 NOTE — Progress Notes (Signed)
Please let the patient know the following:  Your hives labs have returned and are very reassuring.  Your environmental allergy panel and your food allergy panel were both negative.  Your CU index and your tryptase levels were both within normal limits as well.    Based on these findings, would categorize you as chronic idiopathic urticaria which is an intrinsic process and not resultant of an underlying allergy.  Would recommend symptomatic care as discussed in clinic.  We look forward to seeing you in follow-up.  Please let me know if you have any questions or concerns.

## 2021-09-20 ENCOUNTER — Other Ambulatory Visit (HOSPITAL_COMMUNITY): Payer: Self-pay

## 2021-10-18 ENCOUNTER — Other Ambulatory Visit (HOSPITAL_COMMUNITY): Payer: Self-pay

## 2021-10-18 MED ORDER — VALACYCLOVIR HCL 1 G PO TABS
1000.0000 mg | ORAL_TABLET | Freq: Every day | ORAL | 0 refills | Status: DC
Start: 2021-10-18 — End: 2021-11-20
  Filled 2021-10-18: qty 30, 30d supply, fill #0

## 2021-11-04 NOTE — Progress Notes (Deleted)
FOLLOW UP Date of Service/Encounter:  11/04/21   Subjective:  Andrea Dean (DOB: 1986/06/17) is a 36 y.o. female  PMHx of allergic rhinitis, heart palpitations, shortness of breath who [facility] in re-evaluation of the following: chronic idiopathic urticaria History obtained from: chart review and {Persons; PED relatives w/patient:19415::"patient"}.  For Review, LV was on 09/12/21  with Dr.Elbie Statzer seen for reestablishing care for chronic hives.    Pertinent History/Diagnostics:  - Asthma: ***  - *** spirometry (***): ratio ***, *** FEV1 (pre), + *** FEV1 (post) - Allergic Rhinitis: Spring worst season  - SPT environmental panel (2016): + weeds, molds  - labs 2023-negative zone 2 environmental panel - Hives -started in August 2022, associates occasional joint pain since she had COVID 19 in January 2021 and again November 2022.  She has seen both Rheumatology and Neurology.  She has had extensive autoimmune work-up which has been normal.  - lesions resolve in around 30 minutes  - one episode of diarrhea associated with hives; provided Epipen although unclear if diarrhea was from viral illness leading to hive exacerbation vs due to excess histamine production  - SPT common food testing in 2016-negative  - [facility] labs 09/12/21: normal CU index, negative food allergy profile, negative alpha gal panel , normal baseline tryptase 6.0     Allergies as of 11/06/2021       Reactions   Flagyl [metronidazole Hcl] Hives, Nausea And Vomiting   Latex    sensitive        Medication List        Accurate as of November 04, 2021 10:26 AM. If you have any questions, ask your nurse or doctor.          cetirizine 10 MG tablet Commonly known as: ZYRTEC May take up to 2 tablets in morning and 2 tablets in evening for hives prevention and treatment. This is maximum dose. Titrate as needed.   hydrOXYzine 25 MG tablet Commonly known as: ATARAX Take 1  tablet (25 mg total) by mouth every 6 (six) hours as needed for itching.   hydrOXYzine 25 MG tablet Commonly known as: ATARAX Take 1 tablet (25 mg total) by mouth every 6 (six) hours as needed for itching/hives   nitrofurantoin (macrocrystal-monohydrate) 100 MG capsule Commonly known as: MACROBID Take 1 capsule (100 mg total) by mouth 2 (two) times daily for 7 days   triamcinolone ointment 0.1 % Commonly known as: KENALOG Apply topically twice daily to BODY as needed for red, sandpaper like rash.  Do not use on face, groin or armpits.   valACYclovir 1000 MG tablet Commonly known as: Valtrex Take 1 tablet (1,000 mg total) by mouth daily.   valACYclovir 1000 MG tablet Commonly known as: VALTREX Take 1 tablet (1,000 mg total) by mouth daily.   Vitamin D (Ergocalciferol) 1.25 MG (50000 UNIT) Caps capsule Commonly known as: DRISDOL Take 1 capsule (50,000 Units total) by mouth once a week. What changed: additional instructions       Past Medical History:  Diagnosis Date   Abnormal Pap smear    repeat WNL   Chlamydia    Fracture closed, fibula, shaft 2007   left, no surgical repair   GERD (gastroesophageal reflux disease)    H/O seasonal allergies    Herpes genitalis in women    MRSA (methicillin resistant staph aureus) culture positive    Ovarian cyst    Preterm labor    Thrombocytopenia (HCC)  2006 DURING PREGNANCY - NO PROBLEMS WITH DELIVERY OR ANY KNOWN PROBLEMS SINCE   Urinary tract infection    Urticaria    Past Surgical History:  Procedure Laterality Date   CESAREAN SECTION     I & D OF PILONIADAL CYST IN OFFICE - LOCAL - FELT JITTERY AFTERWARDS     PILONIDAL CYST EXCISION N/A 05/05/2014   Procedure:  EXCISION PILONIDAL CYST;  Surgeon: Romie Levee, MD;  Location: [facility];  Service: General;  Laterality: N/A;   WISDOM TOOTH EXTRACTION  2004   Otherwise, there have been no changes to her past medical history, surgical history, family history, or social  history.  ROS: All others negative except as noted per HPI.   Objective:  There were no vitals taken for this visit. There is no height or weight on file to calculate BMI. Physical Exam: General Appearance:  Alert, cooperative, no distress, appears stated age  Head:  Normocephalic, without obvious abnormality, atraumatic  Eyes:  Conjunctiva clear, EOM's intact  Nose: Nares normal, {Blank multiple:19196:a:"***","hypertrophic turbinates","normal mucosa","no visible anterior polyps","septum midline"}  Throat: Lips, tongue normal; teeth and gums normal, {Blank multiple:19196:a:"***","normal posterior oropharynx","tonsils 2+","tonsils 3+","no tonsillar exudate","+ cobblestoning"}  Neck: Supple, symmetrical  Lungs:   {Blank multiple:19196:a:"***","clear to auscultation bilaterally","end-expiratory wheezing","wheezing throughout"}, Respirations unlabored, {Blank multiple:19196:a:"***","no coughing","intermittent dry coughing"}  Heart:  {Blank multiple:19196:a:"***","regular rate and rhythm","no murmur"}, Appears well perfused  Extremities: No edema  Skin: Skin color, texture, turgor normal, no rashes or lesions on visualized portions of skin  Neurologic: No gross deficits   Reviewed: ***  Spirometry:  Tracings reviewed. Her effort: {Blank single:19197::"Good reproducible efforts.","It was hard to get consistent efforts and there is a question as to whether this reflects a maximal maneuver.","Poor effort, data can not be interpreted.","Variable effort-results affected.","decent for first attempt at spirometry."} FVC: ***L FEV1: ***L, ***% predicted FEV1/FVC ratio: ***% Interpretation: {Blank single:19197::"Spirometry consistent with mild obstructive disease","Spirometry consistent with moderate obstructive disease","Spirometry consistent with severe obstructive disease","Spirometry consistent with possible restrictive disease","Spirometry consistent with mixed obstructive and restrictive  disease","Spirometry uninterpretable due to technique","Spirometry consistent with normal pattern","No overt abnormalities noted given today's efforts"}.  Please see scanned spirometry results for details.  Skin Testing: {Blank single:19197::"Select foods","Environmental allergy panel","Environmental allergy panel and select foods","Food allergy panel","None","Deferred due to recent antihistamines use"}. Positive test to: ***. Negative test to: ***.  Results discussed with patient/family.   {Blank single:19197::"Allergy testing results were read and interpreted by myself, documented by clinical staff."," "}  Assessment/Plan   ***  Tonny Bollman, MD  [facility]

## 2021-11-06 ENCOUNTER — Ambulatory Visit: Payer: No Typology Code available for payment source | Admitting: Internal Medicine

## 2021-11-07 ENCOUNTER — Other Ambulatory Visit (HOSPITAL_COMMUNITY): Payer: Self-pay

## 2021-11-19 NOTE — Progress Notes (Signed)
? ?FOLLOW UP ?Date of Service/Encounter:  11/20/21 ? ? ?Subjective:  ?Andrea Dean (DOB: 1986/03/15) is a 36 y.o. female who returns to the [facility] on 11/20/2021 in re-evaluation of the following: chronic hives ?History obtained from: chart review and patient. ? ?For Review, LV was on 09/12/21  with Dr.Dalilah Curlin seen for initial visit for hives.  Discussed chronic and likely non-allergic nature of hives and symptomatic care with high-dose antihistamines.  ? ?Pertinent History/Diagnostics:  ?- CIU - started in August 2022, Occasionally has joint pain since she had COVID 19 in January 2021 and again November 2022.  She has seen both Rheumatology and Neurology.  She has had extensive autoimmune work-up which has been normal.  Lesions resolve in 30 minutes, no bruising ?Had diarrhea and rhinorrhea with one episode. Carries Epipen-never used ? - labwork 09/12/21: cu index wnl, [facility] food allergy panel negative (egg, peanut, soy, milk, clam, shrimp, walnut, codfish, scallop, wheat, corn, sesame), zone 2 environmental panel negative, negative alpha-gal, normal trypase (6.0) ? ?Today presents for follow-up. ?She states that she is overall doing well.  She tried taking just 1 Zyrtec nightly, but this was not enough to control her symptoms.  She would wake up several times with hives on the front of her chest from where she was lying down.  Did have diarrhea again with this episode. ?She is now taking zyrtec twice at night.  This seems to control her symptoms very well.  She does not feel she can increase the dose of her Zyrtec any further as it does seem to make her drowsy.  She has not tried substituting with Claritin, Allegra or Xyzal to see if these make her less drowsy. ?She questions adrenal fatigue and ask about referral to endocrinology.  She has already been referred by her PCP. ? ? ? ?Allergies as of 11/20/2021   ? ?   Reactions  ? Flagyl [metronidazole Hcl] Hives, Nausea And Vomiting  ? Latex   ?  sensitive  ? ?  ? ?  ?Medication List  ?  ? ?  ? Accurate as of Nov 20, 2021  1:40 PM. If you have any questions, ask your nurse or doctor.  ?  ?  ? ?  ? ?STOP taking these medications   ? ?nitrofurantoin (macrocrystal-monohydrate) 100 MG capsule ?Commonly known as: MACROBID ?Stopped by: Sigurd Sos, MD ?  ? ?  ? ?TAKE these medications   ? ?cetirizine 10 MG tablet ?Commonly known as: ZYRTEC ?May take up to 2 tablets in morning and 2 tablets in evening for hives prevention and treatment. This is maximum dose. Titrate as needed. ?  ?hydrOXYzine 25 MG tablet ?Commonly known as: ATARAX ?Take 1 tablet (25 mg total) by mouth every 6 (six) hours as needed for itching/hives ?  ?triamcinolone ointment 0.1 % ?Commonly known as: KENALOG ?Apply topically twice daily to BODY as needed for red, sandpaper like rash.  Do not use on face, groin or armpits. ?  ?valACYclovir 1000 MG tablet ?Commonly known as: Valtrex ?Take 1 tablet (1,000 mg total) by mouth daily. ?  ?Vitamin D (Ergocalciferol) 1.25 MG (50000 UNIT) Caps capsule ?Commonly known as: DRISDOL ?Take 1 capsule (50,000 Units total) by mouth once a week. ?What changed: additional instructions ?  ? ?  ? ?Past Medical History:  ?Diagnosis Date  ? Abnormal Pap smear   ? repeat WNL  ? Chlamydia   ? Fracture closed, fibula, shaft 2007  ? left, no surgical repair  ?  GERD (gastroesophageal reflux disease)   ? H/O seasonal allergies   ? Herpes genitalis in women   ? MRSA (methicillin resistant staph aureus) culture positive   ? Ovarian cyst   ? Preterm labor   ? Thrombocytopenia ([place])   ? 2006 DURING PREGNANCY - NO PROBLEMS WITH DELIVERY OR ANY KNOWN PROBLEMS SINCE  ? Urinary tract infection   ? Urticaria   ? ?Past Surgical History:  ?Procedure Laterality Date  ? CESAREAN SECTION    ? I & D OF PILONIADAL CYST IN OFFICE - LOCAL - FELT JITTERY AFTERWARDS    ? PILONIDAL CYST EXCISION N/A 05/05/2014  ? Procedure:  EXCISION PILONIDAL CYST;  Surgeon: Leighton Ruff, MD;  Location: [facility];   Service: General;  Laterality: N/A;  ? [place] EXTRACTION  2004  ? ?Otherwise, there have been no changes to her past medical history, surgical history, family history, or social history. ? ?ROS: All others negative except as noted per HPI.  ? ?Objective:  ?BP 130/72   Pulse 78   Temp (!) 97.2 ?F (36.2 ?C)   Resp 18   Ht 5\' 3"  (1.6 m)   Wt 155 lb 12.8 oz (70.7 kg)   SpO2 100%   BMI 27.60 kg/m?  ?Body mass index is 27.6 kg/m?Marland Kitchen ?Physical Exam: ?General Appearance:  Alert, cooperative, no distress, appears stated age  ?Head:  Normocephalic, without obvious abnormality, atraumatic  ?Eyes:  Conjunctiva clear, EOM's intact  ?Nose: Nares normal, no rhinorrhea  ?Throat: Lips, tongue normal; teeth and gums normal, normal posterior oropharynx  ?Neck: Supple, symmetrical  ?Lungs:   clear to auscultation bilaterally, Respirations unlabored, no coughing  ?Heart:  regular rate and rhythm and no murmur, Appears well perfused  ?Extremities: No edema  ?Skin: Skin color, texture, turgor normal, no rashes or lesions on visualized portions of skin  ?Neurologic: No gross deficits  ? ?Assessment/Plan  ?Unfortunately Mrs. Schaus continues to have bouts of recurrent hives unless she is taking 2 antihistamines daily.  We discussed trial of other antihistamine brands which may not make her so drowsy so that if she needs to increase to 4-day she has better options. ?We again reviewed the idiopathic nature of most chronic hives. Reassured by her previous lab work. It is concerning that she has diarrhea often with her hives which I suspect is a histamine related symptom and possibly on the MCAS spectrum despite a normal baseline tryptase.  However, this particular symptom seems to be avoided as long as she is taking 2 antihistamines per day.  Will continue symptomatic care at this time. She does have an epipen and we reviewed when this would need to be used in her clinical situation. ? ?Therapy Plan:  ?- continue zyrtec (cetirizine)  to 10mg  twice daily ?- if hives remain uncontrolled, increase dose of zyrtec (cetirizine) to max dose of 20mg  (2 pills) twice daily- this is maximum dose ?- can increase or decrease dosing depending on symptom control to a maximum dose of 4 tablets of antihistamine daily. Wait until hives free for at least one month prior to decreasing dose.   ?- if hives are still uncontrolled with the above regimen, please arrange an appointment for discussion of Xolair (omalizumab)- an injectable medication for hives ?- an epipen prescription has been provided based on your history of hives and diarrhea along with ? ?Can use one of the following in place of zyrtec if desires: Claritin (loratadine) 10 mg, Xyzal (levocetirizine) 5 mg or Allegra (fexofenadine) 180 mg  daily as needed ? ?Follow-up in 3 months, sooner if needed. ?It was a pleasure seeing you again today!  ? ?Sigurd Sos, MD  ?[facility] ? ? ? ? ? ? ?

## 2021-11-20 ENCOUNTER — Encounter: Payer: Self-pay | Admitting: Internal Medicine

## 2021-11-20 ENCOUNTER — Ambulatory Visit (INDEPENDENT_AMBULATORY_CARE_PROVIDER_SITE_OTHER): Payer: No Typology Code available for payment source | Admitting: Internal Medicine

## 2021-11-20 VITALS — BP 130/72 | HR 78 | Temp 97.2°F | Resp 18 | Ht 63.0 in | Wt 155.8 lb

## 2021-11-20 DIAGNOSIS — L508 Other urticaria: Secondary | ICD-10-CM

## 2021-11-20 NOTE — Patient Instructions (Addendum)
Chronic Idiopathic Urticaria: ?- this is defined as hives lasting more than 6 weeks without an identifiable trigger ?- hives can be from a number of different sources including infections, allergies, vibration, temperature, pressure among many others other possible causes ?- often an identifiable cause is not determined ?- some potential triggers include: stress, illness, NSAIDs, aspirin, hormonal changes ?- previous lab evaluation reassuring ?- approximately 50% of patients with chronic hives can have some associated swelling of the face/lips/eyelids (this is not a cause for alarm and does not typically progress onto systemic allergic reactions) ? ? ?Therapy Plan:  ?- continue zyrtec (cetirizine) at 20mg  nightly (see below for other substitutions) ?- if hives remain uncontrolled, increase dose of zyrtec (cetirizine) to max dose of 20mg  (2 pills) twice daily- this is maximum dose ?- can increase or decrease dosing depending on symptom control to a maximum dose of 4 tablets of antihistamine daily. Wait until hives free for at least one month prior to decreasing dose.   ?- if hives are still uncontrolled with the above regimen, please arrange an appointment for discussion of Xolair (omalizumab)- an injectable medication for hives ?- an epipen prescription has been provided based on your history of hives and diarrhea along with ? ?Can use one of the following in place of zyrtec if desires: Claritin (loratadine) 10 mg, Xyzal (levocetirizine) 5 mg or Allegra (fexofenadine) 180 mg daily as needed ? ? ?Follow-up in 3 months, sooner if needed. ?It was a pleasure seeing you again today!  ? ? ?

## 2021-11-27 ENCOUNTER — Other Ambulatory Visit (HOSPITAL_COMMUNITY): Payer: Self-pay

## 2021-11-28 ENCOUNTER — Other Ambulatory Visit (HOSPITAL_COMMUNITY): Payer: Self-pay

## 2021-11-28 MED ORDER — VALACYCLOVIR HCL 1 G PO TABS
1000.0000 mg | ORAL_TABLET | Freq: Every day | ORAL | 0 refills | Status: DC
  Filled 2021-11-28: qty 30, 30d supply, fill #0
  Filled 2021-12-23: qty 30, 30d supply, fill #1
  Filled 2022-01-24: qty 30, 30d supply, fill #2

## 2021-12-17 ENCOUNTER — Other Ambulatory Visit (HOSPITAL_COMMUNITY)
Admission: RE | Admit: 2021-12-17 | Discharge: 2021-12-17 | Disposition: A | Discharge status: Home / Self Care | Payer: No Typology Code available for payment source | Source: Ambulatory Visit | Attending: Obstetrics and Gynecology | Admitting: Obstetrics and Gynecology

## 2021-12-17 DIAGNOSIS — Z01419 Encounter for gynecological examination (general) (routine) without abnormal findings: Secondary | ICD-10-CM | POA: Insufficient documentation

## 2021-12-24 ENCOUNTER — Other Ambulatory Visit (HOSPITAL_COMMUNITY): Payer: Self-pay

## 2021-12-25 LAB — CYTOLOGY - PAP
Comment: NEGATIVE
Diagnosis: NEGATIVE
High risk HPV: NEGATIVE

## 2022-01-08 ENCOUNTER — Other Ambulatory Visit (HOSPITAL_COMMUNITY): Payer: Self-pay

## 2022-01-24 ENCOUNTER — Other Ambulatory Visit (HOSPITAL_COMMUNITY): Payer: Self-pay

## 2022-01-24 MED ORDER — VALACYCLOVIR HCL 1 G PO TABS
1000.0000 mg | ORAL_TABLET | Freq: Every day | ORAL | 0 refills | Status: DC
  Filled 2022-01-24 – 2022-02-25 (×2): qty 90, 90d supply, fill #0

## 2022-02-19 ENCOUNTER — Other Ambulatory Visit (HOSPITAL_COMMUNITY): Payer: Self-pay

## 2022-02-19 MED ORDER — PREDNISONE 5 MG PO TABS
ORAL_TABLET | ORAL | 0 refills | Status: DC
  Filled 2022-02-19: qty 63, 18d supply, fill #0

## 2022-02-19 MED ORDER — HYDROXYCHLOROQUINE SULFATE 200 MG PO TABS
400.0000 mg | ORAL_TABLET | Freq: Every day | ORAL | 2 refills | Status: DC
Start: 2022-02-19 — End: 2023-01-13
  Filled 2022-02-19: qty 60, 30d supply, fill #0

## 2022-02-24 NOTE — Progress Notes (Deleted)
FOLLOW UP Date of Service/Encounter:  02/24/22   Subjective:  Andrea Dean (DOB: 10-Oct-1985) is a 36 y.o. female who [facility] in re-evaluation of the following: Chronic hives History obtained from: chart review and {Persons; PED relatives w/patient:19415::"patient"}.  For Review, LV was on 11/20/21  with Dr.Louie Flenner seen for routine follow-up.  Controlled when taking Zyrtec 2 tablets at night.  Occasionally does have diarrhea with her hives.  Planning work-up with endocrinology.  Today presents for follow-up. ***  ----------------- Pertinent History/Diagnostics:  - CIU - started in August 2022, Occasionally has joint pain since she had COVID 19 in January 2021 and again November 2022.  She has seen both Rheumatology and Neurology.  She has had extensive autoimmune work-up which has been normal.  Lesions resolve in 30 minutes, no bruising Had diarrhea and rhinorrhea with one episode. Carries Epipen-never used                - labwork 09/12/21: cu index wnl, [facility] food allergy panel negative (egg, peanut, soy, milk, clam, shrimp, walnut, codfish, scallop, wheat, corn, sesame), zone 2 environmental panel negative, negative alpha-gal, normal trypase (6.0)  Allergies as of 02/26/2022       Reactions   Flagyl [metronidazole Hcl] Hives, Nausea And Vomiting   Latex    sensitive        Medication List        Accurate as of February 24, 2022 12:44 PM. If you have any questions, ask your nurse or doctor.          cetirizine 10 MG tablet Commonly known as: ZYRTEC May take up to 2 tablets in morning and 2 tablets in evening for hives prevention and treatment. This is maximum dose. Titrate as needed.   hydroxychloroquine 200 MG tablet Commonly known as: Plaquenil Take 2 tablets (400 mg total) by mouth daily.   hydrOXYzine 25 MG tablet Commonly known as: ATARAX Take 1 tablet (25 mg total) by mouth every 6 (six) hours as needed for  itching/hives   predniSONE 5 MG tablet Commonly known as: DELTASONE Take 6 tablets by mouth daily for 3 days, then reduce by 1 tablet every 3 days until gone.   triamcinolone ointment 0.1 % Commonly known as: KENALOG Apply topically twice daily to BODY as needed for red, sandpaper like rash.  Do not use on face, groin or armpits.   valACYclovir 1000 MG tablet Commonly known as: Valtrex Take 1 tablet (1,000 mg total) by mouth daily.   valACYclovir 1000 MG tablet Commonly known as: VALTREX Take 1 tablet (1,000 mg total) by mouth daily.   valACYclovir 1000 MG tablet Commonly known as: VALTREX Take 1 tablet (1,000 mg total) by mouth daily.   Vitamin D (Ergocalciferol) 1.25 MG (50000 UNIT) Caps capsule Commonly known as: DRISDOL Take 1 capsule (50,000 Units total) by mouth once a week. What changed: additional instructions       Past Medical History:  Diagnosis Date   Abnormal Pap smear    repeat WNL   Chlamydia    Fracture closed, fibula, shaft 2007   left, no surgical repair   GERD (gastroesophageal reflux disease)    H/O seasonal allergies    Herpes genitalis in women    MRSA (methicillin resistant staph aureus) culture positive    Ovarian cyst    Preterm labor    Thrombocytopenia ([facility])    2006 DURING PREGNANCY - NO PROBLEMS WITH DELIVERY OR ANY KNOWN PROBLEMS  SINCE   Urinary tract infection    Urticaria    Past Surgical History:  Procedure Laterality Date   CESAREAN SECTION     I & D OF PILONIADAL CYST IN OFFICE - LOCAL - FELT JITTERY AFTERWARDS     PILONIDAL CYST EXCISION N/A 05/05/2014   Procedure:  EXCISION PILONIDAL CYST;  Surgeon: Romie Levee, MD;  Location: [facility];  Service: General;  Laterality: N/A;   WISDOM TOOTH EXTRACTION  2004   Otherwise, there have been no changes to her past medical history, surgical history, family history, or social history.  ROS: All others negative except as noted per HPI.   Objective:  There were no vitals taken for  this visit. There is no height or weight on file to calculate BMI. Physical Exam: General Appearance:  Alert, cooperative, no distress, appears stated age  Head:  Normocephalic, without obvious abnormality, atraumatic  Eyes:  Conjunctiva clear, EOM's intact  Nose: Nares normal, {Blank multiple:19196:a:"***","hypertrophic turbinates","normal mucosa","no visible anterior polyps","septum midline"}  Throat: Lips, tongue normal; teeth and gums normal, {Blank multiple:19196:a:"***","normal posterior oropharynx","tonsils 2+","tonsils 3+","no tonsillar exudate","+ cobblestoning"}  Neck: Supple, symmetrical  Lungs:   {Blank multiple:19196:a:"***","clear to auscultation bilaterally","end-expiratory wheezing","wheezing throughout"}, Respirations unlabored, {Blank multiple:19196:a:"***","no coughing","intermittent dry coughing"}  Heart:  {Blank multiple:19196:a:"***","regular rate and rhythm","no murmur"}, Appears well perfused  Extremities: No edema  Skin: Skin color, texture, turgor normal, no rashes or lesions on visualized portions of skin  Neurologic: No gross deficits   Reviewed: ***  Spirometry:  Tracings reviewed. Her effort: {Blank single:19197::"Good reproducible efforts.","It was hard to get consistent efforts and there is a question as to whether this reflects a maximal maneuver.","Poor effort, data can not be interpreted.","Variable effort-results affected.","decent for first attempt at spirometry."} FVC: ***L FEV1: ***L, ***% predicted FEV1/FVC ratio: ***% Interpretation: {Blank single:19197::"Spirometry consistent with mild obstructive disease","Spirometry consistent with moderate obstructive disease","Spirometry consistent with severe obstructive disease","Spirometry consistent with possible restrictive disease","Spirometry consistent with mixed obstructive and restrictive disease","Spirometry uninterpretable due to technique","Spirometry consistent with normal pattern","No overt  abnormalities noted given today's efforts"}.  Please see scanned spirometry results for details.  Skin Testing: {Blank single:19197::"Select foods","Environmental allergy panel","Environmental allergy panel and select foods","Food allergy panel","None","Deferred due to recent antihistamines use","deferred due to recent reaction"}. ***Adequate positive and negative controls Results discussed with patient/family.   {Blank single:19197::"Allergy testing results were read and interpreted by myself, documented by clinical staff."," "}  Assessment/Plan   ***  Tonny Bollman, MD  [facility]

## 2022-02-25 ENCOUNTER — Other Ambulatory Visit (HOSPITAL_COMMUNITY): Payer: Self-pay

## 2022-02-26 ENCOUNTER — Ambulatory Visit: Payer: No Typology Code available for payment source | Admitting: Internal Medicine

## 2022-03-17 ENCOUNTER — Other Ambulatory Visit (HOSPITAL_COMMUNITY): Payer: Self-pay

## 2022-04-21 ENCOUNTER — Other Ambulatory Visit (HOSPITAL_COMMUNITY): Payer: Self-pay

## 2022-04-21 MED ORDER — ESCITALOPRAM OXALATE 10 MG PO TABS
10.0000 mg | ORAL_TABLET | Freq: Every day | ORAL | 1 refills | Status: DC
  Filled 2022-04-21: qty 30, 30d supply, fill #0

## 2022-05-15 ENCOUNTER — Other Ambulatory Visit (HOSPITAL_COMMUNITY): Payer: Self-pay

## 2022-05-15 ENCOUNTER — Other Ambulatory Visit: Payer: Self-pay | Admitting: Obstetrics

## 2022-05-15 DIAGNOSIS — B9689 Other specified bacterial agents as the cause of diseases classified elsewhere: Secondary | ICD-10-CM

## 2022-05-15 DIAGNOSIS — B379 Candidiasis, unspecified: Secondary | ICD-10-CM

## 2022-05-15 MED ORDER — TINIDAZOLE 500 MG PO TABS
1000.0000 mg | ORAL_TABLET | Freq: Every day | ORAL | 2 refills | Status: DC
  Filled 2022-05-15 – 2022-05-17 (×2): qty 10, 5d supply, fill #0
  Filled 2022-10-15: qty 10, 5d supply, fill #1

## 2022-05-15 MED ORDER — FLUCONAZOLE 150 MG PO TABS
150.0000 mg | ORAL_TABLET | Freq: Once | ORAL | 2 refills | Status: AC
  Filled 2022-05-15 – 2022-05-17 (×2): qty 1, 1d supply, fill #0

## 2022-05-16 ENCOUNTER — Other Ambulatory Visit (HOSPITAL_COMMUNITY): Payer: Self-pay

## 2022-05-17 ENCOUNTER — Other Ambulatory Visit (HOSPITAL_COMMUNITY): Payer: Self-pay

## 2022-05-19 ENCOUNTER — Other Ambulatory Visit (HOSPITAL_COMMUNITY): Payer: Self-pay

## 2022-05-23 ENCOUNTER — Other Ambulatory Visit (HOSPITAL_COMMUNITY): Payer: Self-pay

## 2022-05-26 ENCOUNTER — Other Ambulatory Visit (HOSPITAL_COMMUNITY): Payer: Self-pay

## 2022-05-26 MED ORDER — VALACYCLOVIR HCL 1 G PO TABS
1000.0000 mg | ORAL_TABLET | Freq: Every day | ORAL | 0 refills | Status: DC
Start: 2022-05-26 — End: 2022-08-06
  Filled 2022-05-26: qty 30, 30d supply, fill #0
  Filled 2022-06-23: qty 30, 30d supply, fill #1
  Filled 2022-07-24: qty 30, 30d supply, fill #2

## 2022-06-02 ENCOUNTER — Other Ambulatory Visit (HOSPITAL_COMMUNITY): Payer: Self-pay

## 2022-06-05 ENCOUNTER — Other Ambulatory Visit (HOSPITAL_COMMUNITY): Payer: Self-pay

## 2022-06-05 MED ORDER — SERTRALINE HCL 50 MG PO TABS
50.0000 mg | ORAL_TABLET | Freq: Every day | ORAL | 0 refills | Status: DC
  Filled 2022-06-05: qty 30, 30d supply, fill #0

## 2022-06-13 ENCOUNTER — Other Ambulatory Visit (HOSPITAL_COMMUNITY): Payer: Self-pay

## 2022-06-23 ENCOUNTER — Other Ambulatory Visit (HOSPITAL_COMMUNITY): Payer: Self-pay

## 2022-07-18 ENCOUNTER — Other Ambulatory Visit (HOSPITAL_COMMUNITY): Payer: Self-pay

## 2022-07-25 ENCOUNTER — Other Ambulatory Visit: Payer: Self-pay | Admitting: Obstetrics

## 2022-07-25 ENCOUNTER — Other Ambulatory Visit (HOSPITAL_COMMUNITY): Payer: Self-pay

## 2022-07-25 DIAGNOSIS — J020 Streptococcal pharyngitis: Secondary | ICD-10-CM

## 2022-07-25 MED ORDER — AMOXICILLIN-POT CLAVULANATE 875-125 MG PO TABS
1.0000 | ORAL_TABLET | Freq: Two times a day (BID) | ORAL | 0 refills | Status: DC
  Filled 2022-07-25: qty 14, 7d supply, fill #0

## 2022-07-26 ENCOUNTER — Ambulatory Visit (HOSPITAL_COMMUNITY)
Admission: EM | Admit: 2022-07-26 | Discharge: 2022-07-26 | Disposition: A | Discharge status: Home / Self Care | Payer: 59 | Attending: Physician Assistant | Admitting: Physician Assistant

## 2022-07-26 ENCOUNTER — Encounter (HOSPITAL_COMMUNITY): Payer: Self-pay | Admitting: *Deleted

## 2022-07-26 ENCOUNTER — Other Ambulatory Visit: Payer: Self-pay

## 2022-07-26 DIAGNOSIS — R509 Fever, unspecified: Secondary | ICD-10-CM

## 2022-07-26 DIAGNOSIS — J069 Acute upper respiratory infection, unspecified: Secondary | ICD-10-CM | POA: Diagnosis not present

## 2022-07-26 DIAGNOSIS — Z1152 Encounter for screening for COVID-19: Secondary | ICD-10-CM | POA: Insufficient documentation

## 2022-07-26 DIAGNOSIS — J029 Acute pharyngitis, unspecified: Secondary | ICD-10-CM | POA: Insufficient documentation

## 2022-07-26 LAB — POC INFLUENZA A AND B ANTIGEN (URGENT CARE ONLY)
INFLUENZA A ANTIGEN, POC: NEGATIVE
INFLUENZA B ANTIGEN, POC: NEGATIVE

## 2022-07-26 LAB — POCT RAPID STREP A, ED / UC: Streptococcus, Group A Screen (Direct): NEGATIVE

## 2022-07-26 MED ORDER — OSELTAMIVIR PHOSPHATE 75 MG PO CAPS: 75.0000 mg | ORAL_CAPSULE | Freq: Two times a day (BID) | ORAL | 0 refills | Status: DC

## 2022-07-26 NOTE — ED Provider Notes (Signed)
[facility]    CSN: [id number] Arrival date & time: 07/26/22  1356      History   Chief Complaint Chief Complaint  Patient presents with   Chills    Scratchy throat, body aches, and child with strep throat. - Entered by patient   Sore Throat    HPI Andrea Dean is a 37 y.o. female.   37 year old female presents with fever, sore throat, body aches.  Patient indicates for the past 2 days she has been having scratchy and sore throat with pain with swallowing.  She is also had some mild upper respiratory congestion with rhinitis mainly clear, intermittent cough with chest congestion with clear production.  Patient indicates this morning she started having some mild fever, body aches and pain, soreness, and mild fatigue.  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  She also indicates that her son at home has strep throat and she has been in close contact with him.  Patient indicates that she was prescribed Augmentin 875 mg which she took the first dose this morning due to the fever and sore throat.  She is concerned about possibly having flu or COVID due to the symptoms starting to worsen.  Patient without nausea or vomiting.   Sore Throat    Past Medical History:  Diagnosis Date   Abnormal Pap smear    repeat WNL   Chlamydia    Fracture closed, fibula, shaft 2007   left, no surgical repair   GERD (gastroesophageal reflux disease)    H/O seasonal allergies    Herpes genitalis in women    MRSA (methicillin resistant staph aureus) culture positive    Ovarian cyst    Preterm labor    Thrombocytopenia (HCC)    2006 DURING PREGNANCY - NO PROBLEMS WITH DELIVERY OR ANY KNOWN PROBLEMS SINCE   Urinary tract infection    Urticaria     Patient Active Problem List   Diagnosis Date Noted   Chronic urticaria 09/12/2021   Excessive sleepiness 08/21/2021   Snoring 08/21/2021   Other fatigue 08/21/2021   Paresthesia 05/27/2021    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   Normal labor and delivery 08/10/2018   Heart palpitations 05/04/2018   Syncope 05/04/2018   NST (non-stress test) nonreactive 10/15/2015   Shortness of breath 05/21/2015   Other allergic rhinitis 05/21/2015   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   Vaginitis and vulvovaginitis, unspecified 03/22/2014   Pilonidal abscess 11/07/2013   Pilonidal sinus without abscess 11/02/2013    Past Surgical History:  Procedure Laterality Date   CESAREAN SECTION     I & D OF PILONIADAL CYST IN OFFICE - LOCAL - FELT JITTERY AFTERWARDS     PILONIDAL CYST EXCISION N/A 05/05/2014   Procedure:  EXCISION PILONIDAL CYST;  Surgeon: Romie Levee, MD;  Location: [facility];  Service: General;  Laterality: N/A;   WISDOM TOOTH EXTRACTION  2004    OB History     Gravida  8   Para  4   Term  4   Preterm  0   AB  4   Living  4      SAB  1   IAB  2   Ectopic  1   Multiple  0   Live Births  4            Home Medications    Prior to Admission medications   Medication Sig Start Date End Date Taking? Authorizing Provider  amoxicillin-clavulanate (AUGMENTIN) 875-125 MG tablet Take 1 tablet by mouth 2 (two) times daily. 07/25/22   Shelly Bombard, MD  cetirizine (ZYRTEC) 10 MG tablet May take up to 2 tablets in morning and 2 tablets in evening for hives prevention and treatment. This is maximum dose. Titrate as needed. 09/12/21   Clemon Chambers, MD  escitalopram (LEXAPRO) 10 MG tablet Take 1 tablet (10 mg total) by mouth daily. 04/21/22     hydroxychloroquine (PLAQUENIL) 200 MG tablet Take 2 tablets (400 mg total) by mouth daily. 02/19/22     hydrOXYzine (ATARAX) 25 MG tablet Take 1 tablet (25 mg total) by mouth every 6 (six) hours as needed for itching/hives 09/11/21     predniSONE (DELTASONE) 5 MG tablet Take 6 tablets by mouth daily for 3 days, then reduce by 1 tablet every 3 days until gone. 02/19/22     sertraline (ZOLOFT) 50 MG tablet Take 1 tablet (50 mg total) by  mouth daily. 06/05/22     tinidazole (TINDAMAX) 500 MG tablet Take 2 tablets (1,000 mg total) by mouth daily with breakfast. 05/15/22   Shelly Bombard, MD  triamcinolone ointment (KENALOG) 0.1 % Apply topically twice daily to BODY as needed for red, sandpaper like rash.  Do not use on face, groin or armpits. 09/12/21   Clemon Chambers, MD  valACYclovir (VALTREX) 1000 MG tablet Take 1 tablet (1,000 mg total) by mouth daily. 05/14/15   Shelly Bombard, MD  valACYclovir (VALTREX) 1000 MG tablet Take 1 tablet (1,000 mg total) by mouth daily. 11/28/21     valACYclovir (VALTREX) 1000 MG tablet Take 1 tablet (1,000 mg total) by mouth daily. 05/26/22     Vitamin D, Ergocalciferol, (DRISDOL) 1.25 MG (50000 UNIT) CAPS capsule Take 1 capsule (50,000 Units total) by mouth once a week. Patient taking differently: Take 50,000 Units by mouth once a week. Every other week 04/30/21       Family History Family History  Problem Relation Age of Onset   Cancer Paternal Grandmother        brain, lung   Diabetes Paternal Grandmother    Heart disease Paternal Grandmother    Hypertension Paternal Grandmother    Hypertension Maternal Grandmother    Stroke Maternal Grandmother    Anesthesia problems Neg Hx    Hypotension Neg Hx    Malignant hyperthermia Neg Hx    Pseudochol deficiency Neg Hx    Allergic rhinitis Neg Hx    Asthma Neg Hx    Angioedema Neg Hx    Eczema Neg Hx    Immunodeficiency Neg Hx    Urticaria Neg Hx     Social History Social History   Tobacco Use   Smoking status: Never    Passive exposure: Never   Smokeless tobacco: Never  Vaping Use   Vaping Use: Never used  Substance Use Topics   Alcohol use: No    Alcohol/week: 0.0 standard drinks of alcohol    Comment: occ   Drug use: No     Allergies   Flagyl [metronidazole hcl] and Latex   Review of Systems Review of Systems  Constitutional:  Positive for chills and fever.  HENT:  Positive for sore throat.   Respiratory:   Positive for cough.      Physical Exam Triage Vital Signs ED Triage Vitals  Enc Vitals Group     BP 07/26/22 1409 128/79     Pulse Rate 07/26/22 1409 95     Resp 07/26/22  1409 18     Temp 07/26/22 1409 99.1 F (37.3 C)     Temp src --      SpO2 07/26/22 1409 96 %     Weight --      Height --      Head Circumference --      Peak Flow --      Pain Score 07/26/22 1407 5     Pain Loc --      Pain Edu? --      Excl. in [place]? --    No data found.  Updated Vital Signs BP 128/79   Pulse 95   Temp 99.1 F (37.3 C)   Resp 18   LMP 07/19/2022   SpO2 96%   Visual Acuity Right Eye Distance:   Left Eye Distance:   Bilateral Distance:    Right Eye Near:   Left Eye Near:    Bilateral Near:     Physical Exam Constitutional:      Appearance: She is well-developed.  HENT:     Right Ear: Tympanic membrane and ear canal normal.     Left Ear: Tympanic membrane and ear canal normal.     Mouth/Throat:     Mouth: Mucous membranes are moist.     Pharynx: Posterior oropharyngeal erythema (very mild) present. No oropharyngeal exudate.  Cardiovascular:     Rate and Rhythm: Normal rate and regular rhythm.     Heart sounds: Normal heart sounds.  Pulmonary:     Effort: Pulmonary effort is normal.     Breath sounds: Normal breath sounds and air entry. No wheezing, rhonchi or rales.  Lymphadenopathy:     Cervical: No cervical adenopathy.  Neurological:     Mental Status: She is alert.      UC Treatments / Results  Labs (all labs ordered are listed, but only abnormal results are displayed) Labs Reviewed  POC INFLUENZA A AND B ANTIGEN (URGENT CARE ONLY) - Abnormal; Notable for the following components:      Result Value   INFLUENZA B ANTIGEN, POC POSITIVE (*)    All other components within normal limits  SARS CORONAVIRUS 2 (TAT 6-24 HRS)  CULTURE, GROUP A STREP [facility])  POCT RAPID STREP A, ED / UC  POC INFLUENZA A AND B ANTIGEN (URGENT CARE ONLY)    EKG   Radiology No  results found.  Procedures Procedures (including critical care time)  Medications Ordered in UC Medications - No data to display  Initial Impression / Assessment and Plan / UC Course  I have reviewed the triage vital signs and the nursing notes.  Pertinent labs & imaging results that were available during my care of the patient were reviewed by me and considered in my medical decision making (see chart for details).    Plan: 1.  The acute upper respiratory tract infection be treated with the following: A.  Advised to use Mucinex dm or similar product to control cough and congestion. 2.  The sore throat will be treated with the following: A.  Advised to continue Augmentin 875 mg every 12 hours with food to treat infectious process. B.  Ibuprofen or Tylenol to help control discomfort and pain. 3. The fever will be treated with the following: A.  Advised take ibuprofen or Tylenol as needed for fever and body discomfort. 4.  Screening for COVID-19 will be treated with the following: A.  Treatment may be considered depending on the results of the COVID test. 6.  Advised follow-up PCP or return to urgent care as needed. Final Clinical Impressions(s) / UC Diagnoses   Final diagnoses:  Viral upper respiratory tract infection  Sore throat  Fever, unspecified  Encounter for screening for COVID-19     Discharge Instructions      Advised to use Tylenol or ibuprofen to help control sore throat pain, body aches, and fever.  COVID test will be completed in 48 hours.  If you do not get a call from this office that indicates the test is negative.  Log onto MyChart to view the test results when it post in 48 hours.  Advised to follow-up PCP or return to urgent care if symptoms fail to improve.    ED Prescriptions     Medication Sig Dispense Auth. Provider   oseltamivir (TAMIFLU) 75 MG capsule  (Status: Discontinued) Take 1 capsule (75 mg total) by mouth every 12 (twelve) hours. 10  capsule Nyoka Lint, PA-C      PDMP not reviewed this encounter.   Nyoka Lint, PA-C 07/26/22 1503

## 2022-07-26 NOTE — Discharge Instructions (Addendum)
Advised to use Tylenol or ibuprofen to help control sore throat pain, body aches, and fever.  COVID test will be completed in 48 hours.  If you do not get a call from this office that indicates the test is negative.  Log onto MyChart to view the test results when it post in 48 hours.  Advised to follow-up PCP or return to urgent care if symptoms fail to improve.

## 2022-07-26 NOTE — ED Triage Notes (Addendum)
Pt Sx's started yesterday and Pt started on anti-bx  this morning. Pt has body aches that started today. SOn has tested positive for Strep

## 2022-07-27 LAB — SARS CORONAVIRUS 2 (TAT 6-24 HRS): SARS Coronavirus 2: NEGATIVE

## 2022-07-28 LAB — POC INFLUENZA A AND B ANTIGEN (URGENT CARE ONLY)
INFLUENZA A ANTIGEN, POC: NEGATIVE
INFLUENZA B ANTIGEN, POC: POSITIVE — AB

## 2022-07-29 LAB — CULTURE, GROUP A STREP (THRC)

## 2022-08-06 ENCOUNTER — Other Ambulatory Visit (HOSPITAL_COMMUNITY): Payer: Self-pay

## 2022-08-06 MED ORDER — VALACYCLOVIR HCL 1 G PO TABS
1000.0000 mg | ORAL_TABLET | Freq: Every day | ORAL | 1 refills | Status: DC
  Filled 2022-08-06 – 2022-08-22 (×2): qty 90, 90d supply, fill #0
  Filled 2022-11-17 – 2022-11-22 (×3): qty 90, 90d supply, fill #1

## 2022-08-22 ENCOUNTER — Other Ambulatory Visit (HOSPITAL_COMMUNITY): Payer: Self-pay

## 2022-09-04 ENCOUNTER — Other Ambulatory Visit (HOSPITAL_COMMUNITY): Payer: Self-pay

## 2022-09-04 DIAGNOSIS — L918 Other hypertrophic disorders of the skin: Secondary | ICD-10-CM | POA: Diagnosis not present

## 2022-09-04 DIAGNOSIS — D2372 Other benign neoplasm of skin of left lower limb, including hip: Secondary | ICD-10-CM | POA: Diagnosis not present

## 2022-09-04 DIAGNOSIS — L700 Acne vulgaris: Secondary | ICD-10-CM | POA: Diagnosis not present

## 2022-09-04 DIAGNOSIS — L810 Postinflammatory hyperpigmentation: Secondary | ICD-10-CM | POA: Diagnosis not present

## 2022-09-04 MED ORDER — SPIRONOLACTONE 50 MG PO TABS
50.0000 mg | ORAL_TABLET | Freq: Every day | ORAL | 1 refills | Status: DC
  Filled 2022-09-04: qty 45, 45d supply, fill #0
  Filled 2022-10-15: qty 45, 45d supply, fill #1

## 2022-09-04 MED ORDER — AZELAIC ACID 15 % EX GEL
1.0000 | Freq: Two times a day (BID) | CUTANEOUS | 1 refills | Status: DC
  Filled 2022-09-04: qty 50, 30d supply, fill #0
  Filled 2022-10-15: qty 50, 30d supply, fill #1

## 2022-09-17 ENCOUNTER — Other Ambulatory Visit (HOSPITAL_COMMUNITY): Payer: Self-pay

## 2022-09-17 ENCOUNTER — Other Ambulatory Visit: Payer: Self-pay | Admitting: Internal Medicine

## 2022-09-17 MED ORDER — CETIRIZINE HCL 10 MG PO TABS
20.0000 mg | ORAL_TABLET | Freq: Two times a day (BID) | ORAL | 1 refills | Status: DC
  Filled 2022-09-17 – 2022-09-20 (×2): qty 120, 30d supply, fill #0
  Filled 2022-10-15: qty 120, 30d supply, fill #1

## 2022-09-18 ENCOUNTER — Other Ambulatory Visit (HOSPITAL_COMMUNITY): Payer: Self-pay

## 2022-09-20 ENCOUNTER — Other Ambulatory Visit (HOSPITAL_COMMUNITY): Payer: Self-pay

## 2022-09-23 ENCOUNTER — Other Ambulatory Visit (HOSPITAL_COMMUNITY): Payer: Self-pay

## 2022-10-15 ENCOUNTER — Other Ambulatory Visit (HOSPITAL_COMMUNITY): Payer: Self-pay

## 2022-10-15 ENCOUNTER — Other Ambulatory Visit: Payer: Self-pay

## 2022-11-17 ENCOUNTER — Other Ambulatory Visit: Payer: Self-pay

## 2022-11-17 ENCOUNTER — Other Ambulatory Visit: Payer: Self-pay | Admitting: Internal Medicine

## 2022-11-17 ENCOUNTER — Other Ambulatory Visit (HOSPITAL_COMMUNITY): Payer: Self-pay

## 2022-11-18 ENCOUNTER — Other Ambulatory Visit (HOSPITAL_COMMUNITY): Payer: Self-pay

## 2022-11-22 ENCOUNTER — Other Ambulatory Visit (HOSPITAL_COMMUNITY): Payer: Self-pay

## 2022-11-24 ENCOUNTER — Other Ambulatory Visit (HOSPITAL_COMMUNITY): Payer: Self-pay

## 2022-12-04 ENCOUNTER — Other Ambulatory Visit (HOSPITAL_COMMUNITY): Payer: Self-pay

## 2022-12-04 MED ORDER — SPIRONOLACTONE 50 MG PO TABS
50.0000 mg | ORAL_TABLET | Freq: Every day | ORAL | 1 refills | Status: DC
  Filled 2022-12-04 – 2023-01-09 (×2): qty 45, 45d supply, fill #0

## 2022-12-09 ENCOUNTER — Other Ambulatory Visit (HOSPITAL_COMMUNITY): Payer: Self-pay

## 2022-12-09 MED ORDER — ADAPALENE 0.1 % EX GEL: 1.0000 | Freq: Every day | CUTANEOUS | 11 refills | Status: DC

## 2022-12-11 NOTE — Progress Notes (Signed)
[address] [place] [address] Dept: [phone]  FOLLOW UP NOTE  Patient ID: Andrea Dean, female    DOB: May 09, 1986  Age: 37 y.o. MRN: [id number] Date of Office Visit: 12/12/2022  Assessment  Chief Complaint: Follow-up (Medication is working just need a refill. )  HPI Andrea Dean is a 37 year old female who presents to the clinic for a follow-up visit.  She was last seen in this clinic on 11/20/2021 by Dr. Maurine Minister for evaluation of chronic urticaria.  At that time, she had a large chronic urticaria workup which was within normal limits.  At today's visit, she reports that her urticaria is well-controlled with no breakouts since her last visit to this clinic.  She continues cetirizine 20 mg once a day.  She reports this medication does not make her sleepy.  She has not tried to decrease her cetirizine dosage at this time.  She does report that if she misses a dose she continues to dosing on the next day with no urticarial breakthroughs.  She is not interested in Xolair at this time as her symptoms are well-controlled.  We thoroughly discussed the H1 and H2 ladder for hive management.  She is moderately interested in trying to decrease the cetirizine dose to 10 mg once a day.  We discussed management should hives occur with the decrease in dosage.  Her current medications are listed in the chart.  Drug Allergies:  Allergies  Allergen Reactions   Flagyl [Metronidazole Hcl] Hives and Nausea And Vomiting   Latex     sensitive    Physical Exam: BP 110/80   Pulse 78   Temp 98.1 F (36.7 C) (Temporal)   Resp 16   Wt 163 lb 8 oz (74.2 kg)   SpO2 97%   BMI 28.96 kg/m    Physical Exam Vitals reviewed.  Constitutional:      Appearance: Normal appearance.  HENT:     Head: Normocephalic and atraumatic.     Right Ear: Tympanic membrane normal.     Left Ear: Tympanic membrane normal.     Nose:     Comments: Bilateral nares normal.  Pharynx normal.  Ears normal.  Eyes  normal.    Mouth/Throat:     Pharynx: Oropharynx is clear.  Eyes:     Conjunctiva/sclera: Conjunctivae normal.  Cardiovascular:     Rate and Rhythm: Normal rate and regular rhythm.     Heart sounds: Normal heart sounds. No murmur heard. Pulmonary:     Effort: Pulmonary effort is normal.     Breath sounds: Normal breath sounds.     Comments: Lungs clear to auscultation Musculoskeletal:        General: Normal range of motion.     Cervical back: Normal range of motion and neck supple.  Skin:    General: Skin is warm and dry.  Neurological:     Mental Status: She is alert and oriented to person, place, and time.  Psychiatric:        Mood and Affect: Mood normal.        Behavior: Behavior normal.        Thought Content: Thought content normal.        Judgment: Judgment normal.     Assessment and Plan: 1. Chronic urticaria     Meds ordered this encounter  Medications   cetirizine (ZYRTEC) 10 MG tablet    Sig: May take up to 2 tablets in morning and 2 tablets in evening for  hives prevention and treatment. This is maximum dose. Titrate as needed.    Dispense:  120 tablet    Refill:  11    Patient Instructions  Hives (urticaria) Take the least amount of medications possible while remaining hive free Cetirizine (Zyrtec) 10mg  twice a day and famotidine (Pepcid) 20 mg twice a day. If no symptoms for 7-14 days then decrease to. Cetirizine (Zyrtec) 10mg  twice a day and famotidine (Pepcid) 20 mg once a day.  If no symptoms for 7-14 days then decrease to. Cetirizine (Zyrtec) 10mg  twice a day.  If no symptoms for 7-14 days then decrease to. Cetirizine (Zyrtec) 10mg  once a day. If symptoms return, then step up dosage May use Benadryl (diphenhydramine) as needed for breakthrough hives Keep a detailed symptom journal including foods eaten, contact with allergens, medications taken, weather changes.  Consider Xolair if your symptoms are not well-controlled with the treatment plan as  listed above In case of an allergic reaction, take Benadryl 50 mg every 4 hours, and if life-threatening symptoms occur, inject with EpiPen 0.3 mg.   Call the clinic if this treatment plan is not working well for you.  Follow up in 1 year or sooner if needed.  Return in about 1 year (around 12/12/2023), or if symptoms worsen or fail to improve.    Thank you for the opportunity to care for this patient.  Please do not hesitate to contact me with questions.  Thermon Leyland, FNP [facility]

## 2022-12-11 NOTE — Patient Instructions (Signed)
Hives (urticaria) Take the least amount of medications possible while remaining hive free Cetirizine (Zyrtec) 10mg  twice a day and famotidine (Pepcid) 20 mg twice a day. If no symptoms for 7-14 days then decrease to. Cetirizine (Zyrtec) 10mg  twice a day and famotidine (Pepcid) 20 mg once a day.  If no symptoms for 7-14 days then decrease to. Cetirizine (Zyrtec) 10mg  twice a day.  If no symptoms for 7-14 days then decrease to. Cetirizine (Zyrtec) 10mg  once a day. If symptoms return, then step up dosage May use Benadryl (diphenhydramine) as needed for breakthrough hives Keep a detailed symptom journal including foods eaten, contact with allergens, medications taken, weather changes.  Consider Xolair if your symptoms are not well-controlled with the treatment plan as listed above In case of an allergic reaction, take Benadryl 50 mg every 4 hours, and if life-threatening symptoms occur, inject with EpiPen 0.3 mg.   Call the clinic if this treatment plan is not working well for you.  Follow up in 1 year or sooner if needed.

## 2022-12-12 ENCOUNTER — Other Ambulatory Visit: Payer: Self-pay

## 2022-12-12 ENCOUNTER — Other Ambulatory Visit (HOSPITAL_COMMUNITY): Payer: Self-pay

## 2022-12-12 ENCOUNTER — Encounter: Payer: Self-pay | Admitting: Family Medicine

## 2022-12-12 ENCOUNTER — Ambulatory Visit (INDEPENDENT_AMBULATORY_CARE_PROVIDER_SITE_OTHER): Payer: 59 | Admitting: Family Medicine

## 2022-12-12 VITALS — BP 110/80 | HR 78 | Temp 98.1°F | Resp 16 | Wt 163.5 lb

## 2022-12-12 DIAGNOSIS — L508 Other urticaria: Secondary | ICD-10-CM | POA: Diagnosis not present

## 2022-12-12 MED ORDER — CETIRIZINE HCL 10 MG PO TABS
20.0000 mg | ORAL_TABLET | Freq: Two times a day (BID) | ORAL | 11 refills | Status: DC
  Filled 2022-12-12 – 2023-01-12 (×3): qty 120, 30d supply, fill #0
  Filled 2023-02-25: qty 120, 30d supply, fill #1
  Filled 2023-07-01: qty 120, 30d supply, fill #2
  Filled 2023-07-04: qty 120, 30d supply, fill #0
  Filled 2023-09-08: qty 120, 30d supply, fill #1
  Filled 2023-09-08: qty 120, 30d supply, fill #0
  Filled 2023-10-03: qty 120, 30d supply, fill #1
  Filled 2023-11-20: qty 120, 30d supply, fill #2

## 2022-12-17 ENCOUNTER — Other Ambulatory Visit (HOSPITAL_COMMUNITY): Payer: Self-pay

## 2022-12-24 ENCOUNTER — Emergency Department (HOSPITAL_COMMUNITY)
Admission: EM | Admit: 2022-12-24 | Discharge: 2022-12-24 | Disposition: A | Discharge status: Home / Self Care | Payer: 59 | Attending: Emergency Medicine | Admitting: Emergency Medicine

## 2022-12-24 ENCOUNTER — Emergency Department (HOSPITAL_COMMUNITY): Payer: 59

## 2022-12-24 ENCOUNTER — Encounter (HOSPITAL_COMMUNITY): Payer: Self-pay | Admitting: Emergency Medicine

## 2022-12-24 ENCOUNTER — Other Ambulatory Visit: Payer: Self-pay

## 2022-12-24 DIAGNOSIS — W541XXA Struck by dog, initial encounter: Secondary | ICD-10-CM | POA: Insufficient documentation

## 2022-12-24 DIAGNOSIS — R202 Paresthesia of skin: Secondary | ICD-10-CM | POA: Diagnosis not present

## 2022-12-24 DIAGNOSIS — S8992XA Unspecified injury of left lower leg, initial encounter: Secondary | ICD-10-CM | POA: Diagnosis not present

## 2022-12-24 DIAGNOSIS — M25562 Pain in left knee: Secondary | ICD-10-CM | POA: Diagnosis not present

## 2022-12-24 DIAGNOSIS — Z91040 Latex allergy status: Secondary | ICD-10-CM | POA: Insufficient documentation

## 2022-12-24 MED ORDER — OXYCODONE-ACETAMINOPHEN 5-325 MG PO TABS
1.0000 | ORAL_TABLET | ORAL | Status: AC | PRN
  Administered 2022-12-24 (×2): 1 via ORAL
  Filled 2022-12-24 (×2): qty 1

## 2022-12-24 MED ORDER — OXYCODONE-ACETAMINOPHEN 5-325 MG PO TABS: 2.0000 | ORAL_TABLET | Freq: Once | ORAL | Status: DC

## 2022-12-24 NOTE — ED Provider Notes (Signed)
[place] EMERGENCY DEPARTMENT AT [facility] Provider Note   CSN: [id number] Arrival date & time: 12/24/22  1837     History Chief Complaint  Patient presents with   Leg Injury    Andrea Dean is a 37 y.o. female.  Patient presents to the emergency department following a leg injury.  She reports that she was at home when she tripped over her dog resulting in injury to the left leg.  Reports the majority pain is in the left knee.  States that she is having some difficulty moving the left leg effectively due to pain.  Also reporting some numbness that radiates from the left hip into the lower leg.  No history of any surgeries on this leg.  Difficulty with ambulation due to weakness and pain.  HPI     Home Medications Prior to Admission medications   Medication Sig Start Date End Date Taking? Authorizing Provider  adapalene (DIFFERIN) 0.1 % gel Apply a pea-sized amount to face at night time twice a week. Increase frequency up to every day according to tolerance Patient not taking: Reported on 12/12/2022 12/08/22     Azelaic Acid 15 % gel Apply thin layer on face 2 (two) times daily. 09/04/22     cetirizine (ZYRTEC) 10 MG tablet May take up to 2 tablets in morning and 2 tablets in evening for hives prevention and treatment. This is maximum dose. Titrate as needed. 12/12/22   Hetty Blend, FNP  diclofenac (VOLTAREN) 75 MG EC tablet Take 1 tablet (75 mg total) by mouth 2 (two) times daily with a meal. 12/25/22     hydroxychloroquine (PLAQUENIL) 200 MG tablet Take 2 tablets (400 mg total) by mouth daily. Patient not taking: Reported on 12/12/2022 02/19/22     hydrOXYzine (ATARAX) 25 MG tablet Take 1 tablet (25 mg total) by mouth every 6 (six) hours as needed for itching/hives Patient not taking: Reported on 12/12/2022 09/11/21     spironolactone (ALDACTONE) 50 MG tablet Take 1 tablet (50 mg total) by mouth daily. 12/04/22     tinidazole (TINDAMAX) 500 MG tablet Take 2 tablets (1,000 mg  total) by mouth daily with breakfast. Patient not taking: Reported on 12/12/2022 05/15/22   Brock Bad, MD  triamcinolone ointment (KENALOG) 0.1 % Apply topically twice daily to BODY as needed for red, sandpaper like rash.  Do not use on face, groin or armpits. Patient not taking: Reported on 12/12/2022 09/12/21   Verlee Monte, MD  valACYclovir (VALTREX) 1000 MG tablet Take 1 tablet (1,000 mg total) by mouth daily. 05/14/15   Brock Bad, MD  valACYclovir (VALTREX) 1000 MG tablet Take 1 tablet (1,000 mg total) by mouth daily. Patient not taking: Reported on 12/12/2022 11/28/21     valACYclovir (VALTREX) 1000 MG tablet Take 1 tablet (1,000 mg total) by mouth daily. 12/25/22     Vitamin D, Ergocalciferol, (DRISDOL) 1.25 MG (50000 UNIT) CAPS capsule Take 1 capsule (50,000 Units total) by mouth once a week. Patient not taking: Reported on 12/12/2022 04/30/21         Allergies    Flagyl [metronidazole hcl] and Latex    Review of Systems   Review of Systems  Musculoskeletal:  Positive for gait problem.  All other systems reviewed and are negative.   Physical Exam Updated Vital Signs BP 128/89 (BP Location: Right Arm)   Pulse 89   Temp 97.6 F (36.4 C) (Oral)   Resp 14   Wt 74.2  kg   SpO2 99%   BMI 28.96 kg/m  Physical Exam Vitals and nursing note reviewed.  Constitutional:      General: She is not in acute distress.    Appearance: She is well-developed.  HENT:     Head: Normocephalic and atraumatic.  Eyes:     Conjunctiva/sclera: Conjunctivae normal.  Cardiovascular:     Rate and Rhythm: Normal rate and regular rhythm.     Heart sounds: No murmur heard. Pulmonary:     Effort: Pulmonary effort is normal. No respiratory distress.     Breath sounds: Normal breath sounds.  Abdominal:     Palpations: Abdomen is soft.     Tenderness: There is no abdominal tenderness.  Musculoskeletal:        General: Swelling, tenderness and signs of injury present. No deformity.      Cervical back: Neck supple.     Comments: Limited ability to test left knee/leg due to significant pain.  Some swelling noted when comparing left to right.  No obvious bruising or obvious injury.  Skin:    General: Skin is warm and dry.     Capillary Refill: Capillary refill takes less than 2 seconds.  Neurological:     General: No focal deficit present.     Mental Status: She is alert. Mental status is at baseline.     Motor: No weakness.     Deep Tendon Reflexes: Reflexes normal.  Psychiatric:        Mood and Affect: Mood normal.     ED Results / Procedures / Treatments   Labs (all labs ordered are listed, but only abnormal results are displayed) Labs Reviewed - No data to display  EKG None  Radiology CT Knee Left Wo Contrast  Result Date: 12/24/2022 CLINICAL DATA:  Knee trauma with a occult fracture suspected. X-ray done. EXAM: CT OF THE LEFT KNEE WITHOUT CONTRAST TECHNIQUE: Multidetector CT imaging of the left knee was performed according to the standard protocol. Multiplanar CT image reconstructions were also generated. RADIATION DOSE REDUCTION: This exam was performed according to the departmental dose-optimization program which includes automated exposure control, adjustment of the mA and/or kV according to patient size and/or use of iterative reconstruction technique. COMPARISON:  Left knee radiographs 12/24/2022 FINDINGS: Bones/Joint/Cartilage No evidence of acute fracture or dislocation. No focal bone lesion or bone destruction. Bone cortex appears intact. Joint spaces are normal. Minimal suprapatellar significant effusion. Ligaments Suboptimally assessed by CT. Muscles and Tendons No acute abnormalities. Soft tissues No acute abnormalities. IMPRESSION: No acute bony abnormalities.  Minimal effusion. Electronically Signed   By: Burman Nieves M.D.   On: 12/24/2022 23:13   DG Knee 1-2 Views Left  Result Date: 12/24/2022 CLINICAL DATA:  Knee pain EXAM: LEFT KNEE - 1-2 VIEW  COMPARISON:  None available FINDINGS: No evidence of fracture, dislocation, or joint effusion. No evidence of arthropathy or other focal bone abnormality. Soft tissues are unremarkable. IMPRESSION: Negative. Electronically Signed   By: Charlett Nose M.D.   On: 12/24/2022 20:06    Procedures Procedures   Medications Ordered in ED Medications  oxyCODONE-acetaminophen (PERCOCET/ROXICET) 5-325 MG per tablet 1 tablet (1 tablet Oral Given 12/24/22 2243)    ED Course/ Medical Decision Making/ A&P                           Medical Decision Making Amount and/or Complexity of Data Reviewed Radiology: ordered.  Risk Prescription drug management.   This  patient presents to the ED for concern of leg injury.  Differential diagnosis includes knee dislocation, tibial plateau fracture, meniscal injury, ligamental injury of the knee   Imaging Studies ordered:  I ordered imaging studies including x-ray of left knee, CT of left knee I independently visualized and interpreted imaging which showed no obvious fractures, dislocations, effusions I agree with the radiologist interpretation   Medicines ordered and prescription drug management:  I ordered medication including Percocet for pain Reevaluation of the patient after these medicines showed that the patient improved I have reviewed the patients home medicines and have made adjustments as needed   Problem List / ED Course:  Patient presents to the emergency department complaints of a injury to the leg.  She reports that she was at home earlier today when she hyperextended her left leg when she tripped over her dog.  Reports that she is experiencing some significant pain centered around the left knee and some tingling numbness that extends from the hip down to the leg.  Reports that she is having difficulty walking due to significant pain in this leg. Workup initiated with x-ray imaging of the left knee.  Dose of Percocet ordered for symptomatic  control. X-ray imaging was negative for any acute findings.  Patient reports no improvement with the Percocet.  Patient requesting further imaging for evaluation of knee injury and pain.  Will order CT imaging. CT scan negative for any acute findings as well but obviously this is limiting his difficult to decipher ligamental injuries with CT scanning.  Given reassuring findings however on imaging with CT and x-ray, advised patient that she would likely benefit from outpatient evaluation and follow-up with orthopedics and further advanced imaging if needed if no improvement and seen in the next 1 to 2 weeks.  Encourage patient to manage symptoms as best she can at home with over-the-counter pain medication such as Tylenol, ibuprofen, Aleve.  Also provided patient with information for [facility] per patient preference.  At this time I believe the patient is safe and stable for discharge home and outpatient follow-up.  Patient is hemodynamically stable with pain improving.  Encourage patient to return to the emergency department if she has any acute worsening of her symptoms or begins to experience worsening numbness or weakness in the legs.  Patient is agreeable with this treatment plan verbalized understanding all return precautions.  All questions answered prior to patient discharge.  Final Clinical Impression(s) / ED Diagnoses Final diagnoses:  Acute pain of left knee    Rx / DC Orders ED Discharge Orders     None         Smitty Knudsen, PA-C 12/26/22 0003    Pricilla Loveless, MD 12/30/22 [id number]

## 2022-12-24 NOTE — ED Triage Notes (Signed)
Pt in with L leg pain after hyperextending her leg when tripping over her dog. Pt states her leg is numb from L hip down, states she cannot bear weight on this leg, landed on her buttocks. Most of her pain is to L knee. No obvious deformities noted, pt tolerating sitting in [facility] throughout triage

## 2022-12-24 NOTE — Discharge Instructions (Addendum)
You were seen in the ER for a leg injury. There was no evidence of any fracture, dislocation, or obvious abnormalities on imaging. I would advise reaching out to [facility] for further evaluation if no improvement in symptoms. You were given a knee brace for added support. A prescription for percocet was also sent to your pharmacy as needed for pain. You should continue taking Ibuprofen, Aleve, or Tylenol for mild pain.

## 2022-12-25 ENCOUNTER — Other Ambulatory Visit (HOSPITAL_COMMUNITY): Payer: Self-pay

## 2022-12-25 DIAGNOSIS — M25462 Effusion, left knee: Secondary | ICD-10-CM | POA: Diagnosis not present

## 2022-12-25 DIAGNOSIS — M25562 Pain in left knee: Secondary | ICD-10-CM | POA: Diagnosis not present

## 2022-12-25 MED ORDER — DICLOFENAC SODIUM 75 MG PO TBEC
75.0000 mg | DELAYED_RELEASE_TABLET | Freq: Two times a day (BID) | ORAL | 0 refills | Status: DC
  Filled 2022-12-25: qty 60, 30d supply, fill #0

## 2022-12-25 MED ORDER — VALACYCLOVIR HCL 1 G PO TABS
1.0000 g | ORAL_TABLET | Freq: Every day | ORAL | 0 refills | Status: DC
  Filled 2023-02-25: qty 90, 90d supply, fill #0

## 2022-12-26 DIAGNOSIS — M25562 Pain in left knee: Secondary | ICD-10-CM | POA: Diagnosis not present

## 2022-12-29 DIAGNOSIS — M23612 Other spontaneous disruption of anterior cruciate ligament of left knee: Secondary | ICD-10-CM | POA: Diagnosis not present

## 2022-12-29 DIAGNOSIS — M25462 Effusion, left knee: Secondary | ICD-10-CM | POA: Diagnosis not present

## 2023-01-02 ENCOUNTER — Other Ambulatory Visit (HOSPITAL_BASED_OUTPATIENT_CLINIC_OR_DEPARTMENT_OTHER): Payer: Self-pay | Admitting: Family Medicine

## 2023-01-02 DIAGNOSIS — R109 Unspecified abdominal pain: Secondary | ICD-10-CM | POA: Diagnosis not present

## 2023-01-05 DIAGNOSIS — S83512A Sprain of anterior cruciate ligament of left knee, initial encounter: Secondary | ICD-10-CM | POA: Diagnosis not present

## 2023-01-09 ENCOUNTER — Other Ambulatory Visit (HOSPITAL_COMMUNITY): Payer: Self-pay

## 2023-01-09 DIAGNOSIS — R1011 Right upper quadrant pain: Secondary | ICD-10-CM | POA: Diagnosis not present

## 2023-01-09 DIAGNOSIS — R109 Unspecified abdominal pain: Secondary | ICD-10-CM | POA: Diagnosis not present

## 2023-01-09 DIAGNOSIS — R1012 Left upper quadrant pain: Secondary | ICD-10-CM | POA: Diagnosis not present

## 2023-01-12 ENCOUNTER — Other Ambulatory Visit (HOSPITAL_COMMUNITY): Payer: Self-pay

## 2023-01-12 ENCOUNTER — Other Ambulatory Visit: Payer: Self-pay

## 2023-01-13 ENCOUNTER — Encounter (HOSPITAL_BASED_OUTPATIENT_CLINIC_OR_DEPARTMENT_OTHER): Payer: Self-pay | Admitting: Orthopedic Surgery

## 2023-01-13 ENCOUNTER — Ambulatory Visit (HOSPITAL_BASED_OUTPATIENT_CLINIC_OR_DEPARTMENT_OTHER)
Admission: RE | Admit: 2023-01-13 | Discharge: 2023-01-13 | Disposition: A | Discharge status: Home / Self Care | Payer: 59 | Source: Ambulatory Visit | Attending: Family Medicine | Admitting: Family Medicine

## 2023-01-13 ENCOUNTER — Other Ambulatory Visit: Payer: Self-pay

## 2023-01-13 DIAGNOSIS — R160 Hepatomegaly, not elsewhere classified: Secondary | ICD-10-CM | POA: Diagnosis not present

## 2023-01-13 DIAGNOSIS — R109 Unspecified abdominal pain: Secondary | ICD-10-CM | POA: Diagnosis not present

## 2023-01-13 DIAGNOSIS — K449 Diaphragmatic hernia without obstruction or gangrene: Secondary | ICD-10-CM | POA: Diagnosis not present

## 2023-01-13 DIAGNOSIS — K429 Umbilical hernia without obstruction or gangrene: Secondary | ICD-10-CM | POA: Diagnosis not present

## 2023-01-13 MED ORDER — IOHEXOL 300 MG/ML  SOLN
100.0000 mL | Freq: Once | INTRAMUSCULAR | Status: AC | PRN
  Administered 2023-01-13: 75 mL via INTRAVENOUS

## 2023-01-13 NOTE — Progress Notes (Signed)
   01/13/23 1254  PAT Phone Screen  Is the patient taking a GLP-1 receptor agonist? No  Do You Have Diabetes? No  Do You Have Hypertension? No  Have You Ever Been to the ER for Asthma? No  Have You Taken Oral Steroids in the Past 3 Months? No  Do you Take Phenteramine or any Other Diet Drugs? No  Recent  Lab Work, EKG, CXR? No  Cardiologist Name Followed by cardiology after pregnancy for c/o syncope and palpitations. No abnormalities found in Echo or Holter studies.Last OV w/ [facility] NP Nov 2020- pt to f/u prn. Pt denied any current cp/ sob/ palpitations on pre op call.  Have you ever had tests on your heart? Yes  What cardiac tests were performed? EKG;Echo  Any Recent Hospitalizations? No  Height 5\' 3"  (1.6 m)  Weight 72.6 kg  Pat Appointment Scheduled No  Reason for No Appointment Not Needed

## 2023-01-19 ENCOUNTER — Other Ambulatory Visit (HOSPITAL_COMMUNITY): Payer: Self-pay

## 2023-01-19 DIAGNOSIS — N898 Other specified noninflammatory disorders of vagina: Secondary | ICD-10-CM | POA: Diagnosis not present

## 2023-01-19 DIAGNOSIS — Z113 Encounter for screening for infections with a predominantly sexual mode of transmission: Secondary | ICD-10-CM | POA: Diagnosis not present

## 2023-01-19 DIAGNOSIS — Z0000 Encounter for general adult medical examination without abnormal findings: Secondary | ICD-10-CM | POA: Diagnosis not present

## 2023-01-19 DIAGNOSIS — Z1151 Encounter for screening for human papillomavirus (HPV): Secondary | ICD-10-CM | POA: Diagnosis not present

## 2023-01-19 DIAGNOSIS — E559 Vitamin D deficiency, unspecified: Secondary | ICD-10-CM | POA: Diagnosis not present

## 2023-01-19 DIAGNOSIS — R5383 Other fatigue: Secondary | ICD-10-CM | POA: Diagnosis not present

## 2023-01-19 DIAGNOSIS — Z124 Encounter for screening for malignant neoplasm of cervix: Secondary | ICD-10-CM | POA: Diagnosis not present

## 2023-01-19 DIAGNOSIS — Z13220 Encounter for screening for lipoid disorders: Secondary | ICD-10-CM | POA: Diagnosis not present

## 2023-01-19 DIAGNOSIS — Z01419 Encounter for gynecological examination (general) (routine) without abnormal findings: Secondary | ICD-10-CM | POA: Diagnosis not present

## 2023-01-19 DIAGNOSIS — Z1329 Encounter for screening for other suspected endocrine disorder: Secondary | ICD-10-CM | POA: Diagnosis not present

## 2023-01-19 MED ORDER — FLUCONAZOLE 150 MG PO TABS
150.0000 mg | ORAL_TABLET | Freq: Every day | ORAL | 1 refills | Status: DC
  Filled 2023-01-19: qty 2, 3d supply, fill #0
  Filled 2023-02-25: qty 2, 3d supply, fill #1

## 2023-01-19 NOTE — H&P (Signed)
PREOPERATIVE H&P  Chief Complaint: left knee pain  HPI: Andrea Dean is a 37 y.o. female who presents for preoperative history and physical with a diagnosis of left knee ACL tear. Andrea Dean is a pleasant 37 year old female who tripped over the dog 12/24/2022 with acute onset of pain in the left knee.  She has been barely able to walk.  She has been using crutches.  Pain is severe.  She works as a Engineer, civil (consulting).  She has been doing modified duty because she really cannot be up on the leg for very long.  She does not really do sports.  She does have children that she "chases around".  MRI demonstrates evidence for ACL tear, medial collateral ligament tear.  She has a bucket handle lateral meniscus tear that has flipped. This is significantly impairing activities of daily living.  She has elected for surgical management.   Past Medical History:  Diagnosis Date   Abnormal Pap smear    repeat WNL   ACL tear    Acne    Chlamydia    Fracture closed, fibula, shaft 07/21/2005   left, no surgical repair   GERD (gastroesophageal reflux disease)    H/O seasonal allergies    Herpes genitalis in women    MRSA (methicillin resistant staph aureus) culture positive    Ovarian cyst    Preterm labor    Thrombocytopenia (HCC)    2006 DURING PREGNANCY - NO PROBLEMS WITH DELIVERY OR ANY KNOWN PROBLEMS SINCE   Urinary tract infection    Urticaria    Past Surgical History:  Procedure Laterality Date   CESAREAN SECTION     I & D OF PILONIADAL CYST IN OFFICE - LOCAL - FELT JITTERY AFTERWARDS     PILONIDAL CYST EXCISION N/A 05/05/2014   Procedure:  EXCISION PILONIDAL CYST;  Surgeon: Romie Levee, MD;  Location: [facility];  Service: General;  Laterality: N/A;   WISDOM TOOTH EXTRACTION  07/21/2002   Social History   Socioeconomic History   Marital status: Married    Spouse name: Not on file   Number of children: Not on file   Years of education: Not on file   Highest education level: Not on file  Occupational  History   Not on file  Tobacco Use   Smoking status: Never    Passive exposure: Never   Smokeless tobacco: Never  Vaping Use   Vaping Use: Never used  Substance and Sexual Activity   Alcohol use: No    Alcohol/week: 0.0 standard drinks of alcohol    Comment: occ   Drug use: No   Sexual activity: Yes    Partners: Male    Birth control/protection: None  Other Topics Concern   Not on file  Social History Narrative   Not on file   Social Determinants of Health   Financial Resource Strain: Not on file  Food Insecurity: Not on file  Transportation Needs: Not on file  Physical Activity: Not on file  Stress: Not on file  Social Connections: Not on file   Family History  Problem Relation Age of Onset   Cancer Paternal Grandmother        brain, lung   Diabetes Paternal Grandmother    Heart disease Paternal Grandmother    Hypertension Paternal Grandmother    Hypertension Maternal Grandmother    Stroke Maternal Grandmother    Anesthesia problems Neg Hx    Hypotension Neg Hx    Malignant hyperthermia Neg Hx    Pseudochol  deficiency Neg Hx    Allergic rhinitis Neg Hx    Asthma Neg Hx    Angioedema Neg Hx    Eczema Neg Hx    Immunodeficiency Neg Hx    Urticaria Neg Hx    Allergies  Allergen Reactions   Flagyl [Metronidazole Hcl] Hives and Nausea And Vomiting   Latex     sensitive   Prior to Admission medications   Medication Sig Start Date End Date Taking? Authorizing Provider  cetirizine (ZYRTEC) 10 MG tablet May take up to 2 tablets in morning and 2 tablets in evening for hives prevention and treatment. This is maximum dose. Titrate as needed. 12/12/22  Yes Ambs, Norvel Richards, FNP  diclofenac (VOLTAREN) 75 MG EC tablet Take 1 tablet (75 mg total) by mouth 2 (two) times daily with a meal. 12/25/22  Yes   valACYclovir (VALTREX) 1000 MG tablet Take 1 tablet (1,000 mg total) by mouth daily. 12/25/22  Yes   adapalene (DIFFERIN) 0.1 % gel Apply a pea-sized amount to face at night time  twice a week. Increase frequency up to every day according to tolerance Patient not taking: Reported on 12/12/2022 12/08/22     Azelaic Acid 15 % gel Apply thin layer on face 2 (two) times daily. 09/04/22     fluconazole (DIFLUCAN) 150 MG tablet Take 1 tablet (150 mg total) by mouth once as directed, may repeat dose in 3 days 01/19/23     hydrOXYzine (ATARAX) 25 MG tablet Take 1 tablet (25 mg total) by mouth every 6 (six) hours as needed for itching/hives Patient not taking: Reported on 12/12/2022 09/11/21     spironolactone (ALDACTONE) 50 MG tablet Take 1 tablet (50 mg total) by mouth daily. Patient not taking: Reported on 01/13/2023 12/04/22     tinidazole (TINDAMAX) 500 MG tablet Take 2 tablets (1,000 mg total) by mouth daily with breakfast. Patient not taking: Reported on 12/12/2022 05/15/22   Brock Bad, MD  triamcinolone ointment (KENALOG) 0.1 % Apply topically twice daily to BODY as needed for red, sandpaper like rash.  Do not use on face, groin or armpits. Patient not taking: Reported on 12/12/2022 09/12/21   Verlee Monte, MD     Positive ROS: All other systems have been reviewed and were otherwise negative with the exception of those mentioned in the HPI and as above.  Physical Exam: General: Alert, no acute distress Cardiovascular: No pedal edema Respiratory: No cyanosis, no use of accessory musculature GI: No organomegaly, abdomen is soft and non-tender Skin: No lesions in the area of chief complaint Neurologic: Sensation intact distally Psychiatric: Patient is competent for consent with normal mood and affect Lymphatic: No axillary or cervical lymphadenopathy  MUSCULOSKELETAL: Left knee has significant effusion.  She sits with it over a pillow.  She has fairly exquisite pain and does not like to straighten or bend it.    MRI demonstrates evidence for ACL tear, medial collateral ligament tear.  She has a bucket handle lateral meniscus tear that has flipped.    IMPRESSION: Left  knee ACL tear with meniscal tear and medial collateral ligament tear.      Plan: Plan for Procedure(s): KNEE ARTHROSCOPY WITH ANTERIOR CRUCIATE LIGAMENT (ACL) RECONSTRUCTION WITH TIBIAL ANTERIOR ALLOGRAFT KNEE ARTHROSCOPY WITH LATERAL MENISECTOMY  The risks benefits and alternatives were discussed with the patient including but not limited to the risks of nonoperative treatment, versus surgical intervention including infection, bleeding, nerve injury,  blood clots, cardiopulmonary complications, morbidity, mortality, among others, and  they were willing to proceed.    Armida Sans, PA-C   01/19/2023 1:31 PM

## 2023-01-19 NOTE — Anesthesia Preprocedure Evaluation (Signed)
Anesthesia Evaluation  Patient identified by MRN, date of birth, ID band Patient awake    Reviewed: Allergy & Precautions, NPO status , Patient's Chart, lab work & pertinent test results  Airway Mallampati: III       Dental no notable dental hx. (+) Teeth Intact, Dental Advisory Given   Pulmonary shortness of breath and with exertion   Pulmonary exam normal breath sounds clear to auscultation       Cardiovascular negative cardio ROS Normal cardiovascular exam Rhythm:Regular Rate:Normal     Neuro/Psych negative neurological ROS  negative psych ROS   GI/Hepatic Neg liver ROS,GERD  ,,  Endo/Other  negative endocrine ROS    Renal/GU negative Renal ROS  negative genitourinary   Musculoskeletal Left ACL tear Left lateral meniscus tear   Abdominal   Peds  Hematology negative hematology ROS (+)   Anesthesia Other Findings   Reproductive/Obstetrics                              Anesthesia Physical Anesthesia Plan  ASA: 2  Anesthesia Plan: General   Post-op Pain Management: Dilaudid IV, Precedex and Tylenol PO (pre-op)*   Induction: Intravenous  PONV Risk Score and Plan: 4 or greater and Treatment may vary due to age or medical condition, Midazolam, Ondansetron and Dexamethasone  Airway Management Planned: LMA  Additional Equipment: None  Intra-op Plan:   Post-operative Plan: Extubation in OR  Informed Consent: I have reviewed the patients History and Physical, chart, labs and discussed the procedure including the risks, benefits and alternatives for the proposed anesthesia with the patient or authorized representative who has indicated his/her understanding and acceptance.     Dental advisory given  Plan Discussed with: Anesthesiologist and CRNA  Anesthesia Plan Comments:          Anesthesia Quick Evaluation

## 2023-01-20 ENCOUNTER — Ambulatory Visit (HOSPITAL_BASED_OUTPATIENT_CLINIC_OR_DEPARTMENT_OTHER)
Admission: RE | Admit: 2023-01-20 | Discharge: 2023-01-20 | Disposition: A | Discharge status: Home / Self Care | Payer: 59 | Attending: Orthopedic Surgery | Admitting: Orthopedic Surgery

## 2023-01-20 ENCOUNTER — Other Ambulatory Visit: Payer: Self-pay

## 2023-01-20 ENCOUNTER — Encounter (HOSPITAL_BASED_OUTPATIENT_CLINIC_OR_DEPARTMENT_OTHER): Payer: Self-pay | Admitting: Orthopedic Surgery

## 2023-01-20 ENCOUNTER — Other Ambulatory Visit (HOSPITAL_COMMUNITY): Payer: Self-pay

## 2023-01-20 ENCOUNTER — Encounter (HOSPITAL_BASED_OUTPATIENT_CLINIC_OR_DEPARTMENT_OTHER)
Admission: RE | Disposition: A | Discharge status: Home / Self Care | Payer: Self-pay | Source: Home / Self Care | Attending: Orthopedic Surgery

## 2023-01-20 ENCOUNTER — Ambulatory Visit (HOSPITAL_BASED_OUTPATIENT_CLINIC_OR_DEPARTMENT_OTHER): Payer: 59 | Admitting: Anesthesiology

## 2023-01-20 ENCOUNTER — Ambulatory Visit (HOSPITAL_BASED_OUTPATIENT_CLINIC_OR_DEPARTMENT_OTHER): Payer: 59

## 2023-01-20 ENCOUNTER — Other Ambulatory Visit: Payer: Self-pay | Admitting: Gastroenterology

## 2023-01-20 DIAGNOSIS — W010XXA Fall on same level from slipping, tripping and stumbling without subsequent striking against object, initial encounter: Secondary | ICD-10-CM | POA: Insufficient documentation

## 2023-01-20 DIAGNOSIS — Z01818 Encounter for other preprocedural examination: Secondary | ICD-10-CM

## 2023-01-20 DIAGNOSIS — S83412A Sprain of medial collateral ligament of left knee, initial encounter: Secondary | ICD-10-CM | POA: Diagnosis not present

## 2023-01-20 DIAGNOSIS — G8918 Other acute postprocedural pain: Secondary | ICD-10-CM | POA: Diagnosis not present

## 2023-01-20 DIAGNOSIS — S83252A Bucket-handle tear of lateral meniscus, current injury, left knee, initial encounter: Secondary | ICD-10-CM

## 2023-01-20 DIAGNOSIS — S83512A Sprain of anterior cruciate ligament of left knee, initial encounter: Secondary | ICD-10-CM | POA: Diagnosis not present

## 2023-01-20 DIAGNOSIS — K769 Liver disease, unspecified: Secondary | ICD-10-CM

## 2023-01-20 DIAGNOSIS — S83282A Other tear of lateral meniscus, current injury, left knee, initial encounter: Secondary | ICD-10-CM | POA: Diagnosis not present

## 2023-01-20 HISTORY — PX: KNEE ARTHROSCOPY WITH LATERAL MENISECTOMY: SHX6193

## 2023-01-20 HISTORY — DX: Sprain of anterior cruciate ligament of unspecified knee, initial encounter: S83.519A

## 2023-01-20 HISTORY — DX: Acne, unspecified: L70.9

## 2023-01-20 LAB — POCT PREGNANCY, URINE: Preg Test, Ur: NEGATIVE

## 2023-01-20 SURGERY — KNEE ARTHROSCOPY WITH ANTERIOR CRUCIATE LIGAMENT (ACL) RECONSTRUCTION WITH HAMSTRING GRAFT
Anesthesia: General | Site: Knee | Laterality: Left

## 2023-01-20 MED ORDER — DROPERIDOL 2.5 MG/ML IJ SOLN: 0.6250 mg | Freq: Once | INTRAMUSCULAR | Status: DC | PRN

## 2023-01-20 MED ORDER — CEFAZOLIN SODIUM-DEXTROSE 2-4 GM/100ML-% IV SOLN
2.0000 g | INTRAVENOUS | Status: AC
  Administered 2023-01-20: 2 g via INTRAVENOUS

## 2023-01-20 MED ORDER — DEXAMETHASONE SODIUM PHOSPHATE 10 MG/ML IJ SOLN
INTRAMUSCULAR | Status: DC | PRN
  Administered 2023-01-20: 5 mg via INTRAVENOUS

## 2023-01-20 MED ORDER — ONDANSETRON HCL 4 MG/2ML IJ SOLN
INTRAMUSCULAR | Status: DC | PRN
  Administered 2023-01-20: 4 mg via INTRAVENOUS

## 2023-01-20 MED ORDER — SENNA-DOCUSATE SODIUM 8.6-50 MG PO TABS
2.0000 | ORAL_TABLET | Freq: Every day | ORAL | 0 refills | Status: DC
  Filled 2023-01-20: qty 30, 15d supply, fill #0
  Filled 2023-01-21: qty 100, 50d supply, fill #0

## 2023-01-20 MED ORDER — OXYCODONE HCL 5 MG/5ML PO SOLN: 5.0000 mg | Freq: Once | ORAL | Status: AC | PRN

## 2023-01-20 MED ORDER — FENTANYL CITRATE (PF) 100 MCG/2ML IJ SOLN
INTRAMUSCULAR | Status: AC
  Filled 2023-01-20: qty 2

## 2023-01-20 MED ORDER — FENTANYL CITRATE (PF) 100 MCG/2ML IJ SOLN
50.0000 ug | Freq: Once | INTRAMUSCULAR | Status: AC
  Administered 2023-01-20: 50 ug via INTRAVENOUS

## 2023-01-20 MED ORDER — SODIUM CHLORIDE 0.9 % IR SOLN
Status: DC | PRN
  Administered 2023-01-20: 12000 mL

## 2023-01-20 MED ORDER — BACLOFEN 10 MG PO TABS
10.0000 mg | ORAL_TABLET | Freq: Three times a day (TID) | ORAL | 0 refills | Status: DC
  Filled 2023-01-20: qty 30, 10d supply, fill #0

## 2023-01-20 MED ORDER — HYDROMORPHONE HCL 1 MG/ML IJ SOLN
INTRAMUSCULAR | Status: AC
  Filled 2023-01-20: qty 0.5

## 2023-01-20 MED ORDER — PROPOFOL 10 MG/ML IV BOLUS
INTRAVENOUS | Status: DC | PRN
  Administered 2023-01-20: 200 mg via INTRAVENOUS

## 2023-01-20 MED ORDER — LACTATED RINGERS IV SOLN: INTRAVENOUS | Status: DC

## 2023-01-20 MED ORDER — CEFAZOLIN SODIUM-DEXTROSE 2-4 GM/100ML-% IV SOLN
INTRAVENOUS | Status: AC
  Filled 2023-01-20: qty 100

## 2023-01-20 MED ORDER — ONDANSETRON HCL 4 MG PO TABS
4.0000 mg | ORAL_TABLET | Freq: Three times a day (TID) | ORAL | 0 refills | Status: DC | PRN
  Filled 2023-01-20: qty 10, 4d supply, fill #0

## 2023-01-20 MED ORDER — OXYCODONE HCL 5 MG PO TABS
5.0000 mg | ORAL_TABLET | ORAL | 0 refills | Status: DC | PRN
  Filled 2023-01-20: qty 30, 5d supply, fill #0

## 2023-01-20 MED ORDER — FENTANYL CITRATE (PF) 100 MCG/2ML IJ SOLN
INTRAMUSCULAR | Status: DC | PRN
  Administered 2023-01-20: 50 ug via INTRAVENOUS
  Administered 2023-01-20 (×2): 25 ug via INTRAVENOUS

## 2023-01-20 MED ORDER — POVIDONE-IODINE 7.5 % EX SOLN
Freq: Once | CUTANEOUS | Status: AC
  Filled 2023-01-20: qty 118

## 2023-01-20 MED ORDER — HYDROMORPHONE HCL 1 MG/ML IJ SOLN
0.2500 mg | INTRAMUSCULAR | Status: DC | PRN
  Administered 2023-01-20: 0.5 mg via INTRAVENOUS

## 2023-01-20 MED ORDER — BUPIVACAINE LIPOSOME 1.3 % IJ SUSP
INTRAMUSCULAR | Status: DC | PRN
  Administered 2023-01-20: 10 mL via PERINEURAL

## 2023-01-20 MED ORDER — ACETAMINOPHEN 500 MG PO TABS
ORAL_TABLET | ORAL | Status: AC
  Filled 2023-01-20: qty 2

## 2023-01-20 MED ORDER — LIDOCAINE 2% (20 MG/ML) 5 ML SYRINGE
INTRAMUSCULAR | Status: DC | PRN
  Administered 2023-01-20: 60 mg via INTRAVENOUS

## 2023-01-20 MED ORDER — ONDANSETRON HCL 4 MG/2ML IJ SOLN: 4.0000 mg | Freq: Once | INTRAMUSCULAR | Status: DC | PRN

## 2023-01-20 MED ORDER — HYDROMORPHONE HCL 1 MG/ML IJ SOLN
INTRAMUSCULAR | Status: DC | PRN
  Administered 2023-01-20: .5 mg via INTRAVENOUS

## 2023-01-20 MED ORDER — OXYCODONE HCL 5 MG PO TABS
ORAL_TABLET | ORAL | Status: AC
  Filled 2023-01-20: qty 1

## 2023-01-20 MED ORDER — MIDAZOLAM HCL 2 MG/2ML IJ SOLN
INTRAMUSCULAR | Status: AC
  Filled 2023-01-20: qty 2

## 2023-01-20 MED ORDER — DEXAMETHASONE SODIUM PHOSPHATE 10 MG/ML IJ SOLN
INTRAMUSCULAR | Status: AC
  Filled 2023-01-20: qty 1

## 2023-01-20 MED ORDER — VITAMIN D (ERGOCALCIFEROL) 50000 UNITS PO CAPS
50000.0000 [IU] | ORAL_CAPSULE | ORAL | 0 refills | Status: DC
  Filled 2023-01-20: qty 12, 84d supply, fill #0

## 2023-01-20 MED ORDER — BUPIVACAINE HCL (PF) 0.5 % IJ SOLN
INTRAMUSCULAR | Status: DC | PRN
  Administered 2023-01-20: 10 mL via PERINEURAL

## 2023-01-20 MED ORDER — ONDANSETRON HCL 4 MG/2ML IJ SOLN
INTRAMUSCULAR | Status: AC
  Filled 2023-01-20: qty 2

## 2023-01-20 MED ORDER — OXYCODONE HCL 5 MG PO TABS
5.0000 mg | ORAL_TABLET | Freq: Once | ORAL | Status: AC | PRN
  Administered 2023-01-20: 5 mg via ORAL

## 2023-01-20 MED ORDER — POVIDONE-IODINE 10 % EX SWAB
2.0000 | Freq: Once | CUTANEOUS | Status: AC
  Administered 2023-01-20: 2 via TOPICAL

## 2023-01-20 MED ORDER — MIDAZOLAM HCL 2 MG/2ML IJ SOLN
2.0000 mg | Freq: Once | INTRAMUSCULAR | Status: AC
  Administered 2023-01-20: 2 mg via INTRAVENOUS

## 2023-01-20 MED ORDER — ACETAMINOPHEN 500 MG PO TABS
1000.0000 mg | ORAL_TABLET | Freq: Once | ORAL | Status: AC
  Administered 2023-01-20: 1000 mg via ORAL

## 2023-01-20 MED ORDER — DEXMEDETOMIDINE HCL IN NACL 80 MCG/20ML IV SOLN
INTRAVENOUS | Status: DC | PRN
  Administered 2023-01-20: 4 ug via INTRAVENOUS
  Administered 2023-01-20: 8 ug via INTRAVENOUS
  Administered 2023-01-20: 4 ug via INTRAVENOUS

## 2023-01-20 SURGICAL SUPPLY — 75 items
ANCHOR BUTTON TIGHTROPE ACL RT (Orthopedic Implant) IMPLANT
BANDAGE ESMARK 6X9 LF (GAUZE/BANDAGES/DRESSINGS) ×2 IMPLANT
BLADE EXCALIBUR 4.0X13 (MISCELLANEOUS) IMPLANT
BLADE SURG 15 STRL LF DISP TIS (BLADE) ×2 IMPLANT
BLADE SURG 15 STRL SS (BLADE) ×2
BNDG CMPR 5X62 HK CLSR LF (GAUZE/BANDAGES/DRESSINGS) ×2
BNDG CMPR 6 X 5 YARDS HK CLSR (GAUZE/BANDAGES/DRESSINGS) ×2
BNDG CMPR 6"X 5 YARDS HK CLSR (GAUZE/BANDAGES/DRESSINGS) ×2
BNDG CMPR 9X6 STRL LF SNTH (GAUZE/BANDAGES/DRESSINGS) ×2
BNDG ELASTIC 6INX 5YD STR LF (GAUZE/BANDAGES/DRESSINGS) ×2 IMPLANT
BNDG ESMARK 6X9 LF (GAUZE/BANDAGES/DRESSINGS) ×2
BURR OVAL 8 FLU 4.0X13 (MISCELLANEOUS) IMPLANT
BURR OVAL 8 FLU 5.0X13 (MISCELLANEOUS) IMPLANT
CLSR STERI-STRIP ANTIMIC 1/2X4 (GAUZE/BANDAGES/DRESSINGS) ×2 IMPLANT
COVER BACK TABLE 60X90IN (DRAPES) ×2 IMPLANT
CUFF TOURN SGL QUICK 34 (TOURNIQUET CUFF) ×2
CUFF TRNQT CYL 34X4.125X (TOURNIQUET CUFF) IMPLANT
DISSECTOR 3.8MM X 13CM (MISCELLANEOUS) ×2 IMPLANT
DISSECTOR 4.0MM X 13CM (MISCELLANEOUS) IMPLANT
DRAPE IMP U-DRAPE 54X76 (DRAPES) ×2 IMPLANT
DRAPE OEC MINIVIEW 54X84 (DRAPES) ×2 IMPLANT
DRAPE TOP ARMCOVERS (MISCELLANEOUS) ×2 IMPLANT
DRAPE U-SHAPE 47X51 STRL (DRAPES) ×2 IMPLANT
DRAPE-T ARTHROSCOPY W/POUCH (DRAPES) ×2 IMPLANT
DRILL FLIPCUTTER III 6-12 (ORTHOPEDIC DISPOSABLE SUPPLIES) IMPLANT
DURAPREP 26ML APPLICATOR (WOUND CARE) ×2 IMPLANT
ELECT REM PT RETURN 9FT ADLT (ELECTROSURGICAL) ×2
ELECTRODE REM PT RTRN 9FT ADLT (ELECTROSURGICAL) ×2 IMPLANT
EXCALIBUR 3.8MM X 13CM (MISCELLANEOUS) IMPLANT
FIBERSTICK 2 (SUTURE) IMPLANT
FLIPCUTTER III 6-12 AR-1204FF (ORTHOPEDIC DISPOSABLE SUPPLIES) ×2
GAUZE SPONGE 4X4 12PLY STRL (GAUZE/BANDAGES/DRESSINGS) ×2 IMPLANT
GLOVE BIO SURGEON STRL SZ7 (GLOVE) ×2 IMPLANT
GLOVE BIOGEL PI IND STRL 7.0 (GLOVE) ×2 IMPLANT
GLOVE BIOGEL PI IND STRL 8 (GLOVE) ×4 IMPLANT
GLOVE ORTHO TXT STRL SZ7.5 (GLOVE) ×2 IMPLANT
GLOVE SURG SS PI 6.5 STRL IVOR (GLOVE) IMPLANT
GLOVE SURG SS PI 7.5 STRL IVOR (GLOVE) IMPLANT
GLOVE SURG SYN 8.0 (GLOVE) ×4 IMPLANT
GLOVE SURG SYN 8.0 PF PI (GLOVE) IMPLANT
GOWN STRL REUS W/ TWL LRG LVL3 (GOWN DISPOSABLE) ×4 IMPLANT
GOWN STRL REUS W/ TWL XL LVL3 (GOWN DISPOSABLE) ×2 IMPLANT
GOWN STRL REUS W/TWL LRG LVL3 (GOWN DISPOSABLE) ×4
GOWN STRL REUS W/TWL XL LVL3 (GOWN DISPOSABLE) ×2
GRAFT TISS ANT TIB TNDN (Tissue) IMPLANT
IV NS IRRIG 3000ML ARTHROMATIC (IV SOLUTION) ×4 IMPLANT
KIT TRANSTIBIAL (DISPOSABLE) IMPLANT
MANIFOLD NEPTUNE II (INSTRUMENTS) ×2 IMPLANT
NS IRRIG 1000ML POUR BTL (IV SOLUTION) ×2 IMPLANT
PACK ARTHROSCOPY DSU (CUSTOM PROCEDURE TRAY) ×2 IMPLANT
PACK BASIN DAY SURGERY FS (CUSTOM PROCEDURE TRAY) ×2 IMPLANT
PAD CAST 4YDX4 CTTN HI CHSV (CAST SUPPLIES) IMPLANT
PADDING CAST COTTON 4X4 STRL (CAST SUPPLIES)
PADDING CAST COTTON 6X4 STRL (CAST SUPPLIES) ×2 IMPLANT
PENCIL SMOKE EVACUATOR (MISCELLANEOUS) IMPLANT
PORT APPOLLO RF 90DEGREE MULTI (SURGICAL WAND) ×2 IMPLANT
SCREW INTERFERENCE FT BC 9X20 (Screw) IMPLANT
SLEEVE SCD COMPRESS KNEE MED (STOCKING) ×2 IMPLANT
SPONGE T-LAP 4X18 ~~LOC~~+RFID (SPONGE) ×2 IMPLANT
SUCTION TUBE FRAZIER 10FR DISP (SUCTIONS) IMPLANT
SUT MNCRL AB 4-0 PS2 18 (SUTURE) IMPLANT
SUT VIC AB 0 CT1 27 (SUTURE) ×6
SUT VIC AB 0 CT1 27XBRD ANBCTR (SUTURE) ×6 IMPLANT
SUT VIC AB 2-0 SH 27 (SUTURE)
SUT VIC AB 2-0 SH 27XBRD (SUTURE) IMPLANT
SUT VIC AB 3-0 SH 27 (SUTURE)
SUT VIC AB 3-0 SH 27X BRD (SUTURE) IMPLANT
SUT VIC AB 4-0 PS2 18 (SUTURE) IMPLANT
SUT VICRYL 3-0 CR8 SH (SUTURE) ×2 IMPLANT
SUTURE TAPE 1.3 FIBERLOP 20 ST (SUTURE) ×4 IMPLANT
SUTURETAPE 1.3 FIBERLOOP 20 ST (SUTURE) ×4
TENDON ANTERIOR TIBIALIS (Tissue) ×2 IMPLANT
TOWEL GREEN STERILE FF (TOWEL DISPOSABLE) ×6 IMPLANT
TUBING ARTHROSCOPY IRRIG 16FT (MISCELLANEOUS) ×2 IMPLANT
WRAP KNEE MAXI GEL POST OP (GAUZE/BANDAGES/DRESSINGS) ×2 IMPLANT

## 2023-01-20 NOTE — Anesthesia Postprocedure Evaluation (Signed)
Anesthesia Post Note  Patient: [name], television/film/video  Procedure(s) Performed: KNEE ARTHROSCOPY WITH ANTERIOR CRUCIATE LIGAMENT (ACL) RECONSTRUCTION WITH TIBIAL ANTERIOR ALLOGRAFT (Left) KNEE ARTHROSCOPY WITH LATERAL MENISECTOMY (Left: Knee)     Patient location during evaluation: PACU Anesthesia Type: General Level of consciousness: awake and alert and oriented Pain management: pain level controlled Vital Signs Assessment: post-procedure vital signs reviewed and stable Respiratory status: spontaneous breathing, nonlabored ventilation and respiratory function stable Cardiovascular status: blood pressure returned to baseline and stable Postop Assessment: no apparent nausea or vomiting Anesthetic complications: no   No notable events documented.  Last Vitals:  Vitals:   01/20/23 1030 01/20/23 1045  BP: 120/74 121/79  Pulse: 91 90  Resp: 16 14  Temp:    SpO2: 97% 97%    Last Pain:  Vitals:   01/20/23 1045  TempSrc:   PainSc: 4                  Andrea Dean A.

## 2023-01-20 NOTE — Transfer of Care (Signed)
Immediate Anesthesia Transfer of Care Note  Patient: Andrea Dean  Procedure(s) Performed: KNEE ARTHROSCOPY WITH ANTERIOR CRUCIATE LIGAMENT (ACL) RECONSTRUCTION WITH TIBIAL ANTERIOR ALLOGRAFT (Left) KNEE ARTHROSCOPY WITH LATERAL MENISECTOMY (Left: Knee)  Patient Location: PACU  Anesthesia Type:GA combined with regional for post-op pain  Level of Consciousness: sedated  Airway & Oxygen Therapy: Patient Spontanous Breathing and Patient connected to face mask oxygen  Post-op Assessment: Report given to RN and Post -op Vital signs reviewed and stable  Post vital signs: Reviewed and stable  Last Vitals:  Vitals Value Taken Time  BP 103/64 01/20/23 0954  Temp    Pulse 73 01/20/23 0956  Resp 11 01/20/23 0956  SpO2 100 % 01/20/23 0956  Vitals shown include unvalidated device data.  Last Pain:  Vitals:   01/20/23 0630  TempSrc: Temporal  PainSc: 0-No pain      Patients Stated Pain Goal: 5 (01/20/23 0630)  Complications: No notable events documented.

## 2023-01-20 NOTE — Anesthesia Procedure Notes (Signed)
Anesthesia Regional Block: Adductor canal block   Pre-Anesthetic Checklist: , timeout performed,  Correct Patient, Correct Site, Correct Laterality,  Correct Procedure, Correct Position, site marked,  Risks and benefits discussed,  Surgical consent,  Pre-op evaluation,  At surgeon's request and post-op pain management  Laterality: Left  Prep: chloraprep       Needles:  Injection technique: Single-shot  Needle Type: Echogenic Stimulator Needle     Needle Length: 10cm  Needle Gauge: 21   Needle insertion depth: 6 cm   Additional Needles:   Procedures:,,,, ultrasound used (permanent image in chart),,    Narrative:  Start time: 01/20/2023 7:27 AM End time: 01/20/2023 7:32 AM Injection made incrementally with aspirations every 5 mL.  Performed by: Personally  Anesthesiologist: Mal Amabile, MD  Additional Notes: Timeout performed. Patient sedated. Relevant anatomy ID'd using [place]. Incremental 2-31ml injection of LA with frequent aspiration. Patient tolerated procedure well.     Left Adductor Canal Block

## 2023-01-20 NOTE — Progress Notes (Signed)
AssistedDr. Foster with left, adductor canal, ultrasound guided block. Side rails up, monitors on throughout procedure. See vital signs in flow sheet. Tolerated Procedure well. ? ?

## 2023-01-20 NOTE — Interval H&P Note (Signed)
History and Physical Interval Note:  01/20/2023 7:19 AM  Andrea Dean  has presented today for surgery, with the diagnosis of left knee lateral meniscus tearm ACL tear,.  The various methods of treatment have been discussed with the patient and family. After consideration of risks, benefits and other options for treatment, the patient has consented to  Procedure(s): KNEE ARTHROSCOPY WITH ANTERIOR CRUCIATE LIGAMENT (ACL) RECONSTRUCTION WITH TIBIAL ANTERIOR ALLOGRAFT (Left) KNEE ARTHROSCOPY WITH LATERAL MENISECTOMY (Left) as a surgical intervention.  The patient's history has been reviewed, patient examined, no change in status, stable for surgery.  I have reviewed the patient's chart and labs.  Questions were answered to the patient's satisfaction.     Eulas Post

## 2023-01-20 NOTE — Discharge Instructions (Addendum)
You may have Tylenol again after 12:30pm today, if needed.  Post Anesthesia Home Care Instructions  Activity: Get plenty of rest for the remainder of the day. A responsible individual must stay with you for 24 hours following the procedure.  For the next 24 hours, DO NOT: -Drive a car -Advertising copywriter -Drink alcoholic beverages -Take any medication unless instructed by your physician -Make any legal decisions or sign important papers.  Meals: Start with liquid foods such as gelatin or soup. Progress to regular foods as tolerated. Avoid greasy, spicy, heavy foods. If nausea and/or vomiting occur, drink only clear liquids until the nausea and/or vomiting subsides. Call your physician if vomiting continues.  Special Instructions/Symptoms: Your throat may feel dry or sore from the anesthesia or the breathing tube placed in your throat during surgery. If this causes discomfort, gargle with warm salt water. The discomfort should disappear within 24 hours.  If you had a scopolamine patch placed behind your ear for the management of post- operative nausea and/or vomiting:  1. The medication in the patch is effective for 72 hours, after which it should be removed.  Wrap patch in a tissue and discard in the trash. Wash hands thoroughly with soap and water. 2. You may remove the patch earlier than 72 hours if you experience unpleasant side effects which may include dry mouth, dizziness or visual disturbances. 3. Avoid touching the patch. Wash your hands with soap and water after contact with the patch.   Regional Anesthesia Blocks  1. Numbness or the inability to move the "blocked" extremity may last from 3-48 hours after placement. The length of time depends on the medication injected and your individual response to the medication. If the numbness is not going away after 48 hours, call your surgeon.  2. The extremity that is blocked will need to be protected until the numbness is gone and the   Strength has returned. Because you cannot feel it, you will need to take extra care to avoid injury. Because it may be weak, you may have difficulty moving it or using it. You may not know what position it is in without looking at it while the block is in effect.  3. For blocks in the legs and feet, returning to weight bearing and walking needs to be done carefully. You will need to wait until the numbness is entirely gone and the strength has returned. You should be able to move your leg and foot normally before you try and bear weight or walk. You will need someone to be with you when you first try to ensure you do not fall and possibly risk injury.  4. Bruising and tenderness at the needle site are common side effects and will resolve in a few days.  5. Persistent numbness or new problems with movement should be communicated to the surgeon or the [facility] [phone] [facility] [phone]).   ACL Reconstruction Post-Operative Instructions   Diet: Start with some clear liquids, soups, etc, and advance to your regular diet as tolerated.  Dressing: You may remove your dressing 3-5 days after surgery and shower. There are steri-strips (white strips) over the incisions. Your stitches are absorbable. Leave the steri-strips in place when changing your dressings, they will peel off with time, usually 2-3 weeks. Keep your wounds covered with band-aids/gauze until your first post-op appointment.  Activity: Increase activity slowly as tolerated, but follow the weight bearing instructions below. You cannot drive while taking narcotic medications.  You may bend and straighten the leg as soon as you feel comfortable. Wear brace at all times when weight bearing.   Weight Bearing: As tolerated  Medications: You will want to take some of your pain medications tonight before going to bed to make sure you have something in your system when the numbing medicine/block wears off.  The maximum dose of Tylenol (Acetaminophen) in a day is 3000-4000 mg, and beware that your pain medication may have Tylenol (acetaminophen) in it. As your pain improves, you can begin to taper the amount of narcotic you are using. You may want to avoid using ibuprofen/motrin/NSAIDs for the first 4-6 weeks, as they can slow down tendon and bone healing.  To prevent constipation: you may use a stool softener such as - Colace (over the counter) 100 mg by mouth twice a day Drink plenty of fluids (prune juice may be helpful) and high fiber foods Miralax (over the counter) for constipation as needed.  Itching: If you are itching or having other side effects with your pain medications, try taking only a single pain pill, or even half a pain pill at a time. You can also use Benadryl for itching or sleep.  Precautions: If you experience chest pain or shortness of breath - call 911 immediately for transfer tothe hospital emergency department!! If you develop a fever greater that 101 F, purulent drainage from wound, increased redness or drainagefrom wound, or calf pain -- Call the office at [phone] Follow- Up Appointment: Please call for an appointment to be seen in 2 weeks [phone] in [place]. After-Hours: We have an Urgent Care available for after-hours emergencies located at the [facility] office at [employer] in [place] open from 5:30p-9p every night, and from 10a-2p on Saturday and Sunday. There is also an on call physician 24-7 for emergencies that can be reached at [phone].

## 2023-01-20 NOTE — Op Note (Signed)
01/20/2023  9:43 AM  PATIENT:  Andrea Dean    PRE-OPERATIVE DIAGNOSIS: Left ACL tear, MCL tear, bucket-handle lateral meniscus tear  POST-OPERATIVE DIAGNOSIS:  Same  PROCEDURE: Left knee arthroscopy, partial lateral meniscectomy, ACL reconstruction using tibialis anterior allograft, with 2 views of the left knee taken postoperatively demonstrating appropriate position of the button and tunnel  SURGEON:  Teryl Lucy, MD  PHYSICIAN ASSISTANT: Janine Ores, PA-C, present and scrubbed throughout the case, critical for completion in a timely fashion, and for retraction, instrumentation, and closure.  ANESTHESIA:   General  ESTIMATED BLOOD LOSS: Minimal  PREOPERATIVE INDICATIONS:  Andrea Dean is a  37 y.o. female who tore their ACL and failed conservative measures and elected for surgical management.  She had a bucket-handle lateral meniscus tear as well.  The risks benefits and alternatives were discussed with the patient preoperatively including but not limited to the risks of infection, bleeding, nerve injury, stiffness, cardiopulmonary complications, the need for revision surgery, recurrent instability, progression of arthritis, the potential for use of a allograft and related disease transmission risks, among others and the patient was willing to proceed.    OPERATIVE IMPLANTS: Size 9 tibialis anterior allograft, with a Arthrex standard size button on the femur and a 9 x 20 mm bio composite tibial interference screw for the tibia  OPERATIVE FINDINGS: The anterior cruciate ligament was completely torn. The PCL was intact. The posterior lateral corner was intact to dial testing.  The medial meniscus was intact she had grade 2 laxity to the MCL the lateral meniscus had a complex tear, some of it was fairly central in the white white zone, some of it was slightly more peripheral as extended around to the root, but the overall tissue quality was pretty poor, and I did not feel that the  location of the meniscal tear had adequate vascularity for healing.  UNIQUE ASPECTS OF THE CASE: Lateral meniscus had a complex tear.  This was excised.  The ACL had a fairly large stump of it that was torn, there was some remaining stretched fibers.  The patellofemoral joint and medial compartment were normal.  I inadvertently failed to dilate the tibia, and after passing the graft, I had to sacrifice a button and convert to a second button after achieving the dilation.  After this the graft passage was smooth.  Her overall anatomy was fairly small.  OPERATIVE PROCEDURE: The patient was brought to the operating room and placed in the supine position. General anesthesia was administered. IV antibiotics were given. The lower extremity was prepped and draped in usual sterile fashion. Exam under anesthesia demonstrated the above-named findings. Time out was performed.  Tourniquet was not utilized. Incision was made over the proximal tibia.   Knee arthroscopy was then performed, and the above named findings were noted.    The lateral meniscus was removed with a combination of the shaver and a basket, and there was some peripheral meniscus posteriorly still present, although a fair amount had to be removed because of the tear.  I then removed the previous anterior cruciate ligament stump, and performed a mild notchplasty.  The outside in guide was then applied to the appropriate position and the retro-cutter was used to drill the femoral socket. Care was taken to maintain the cortical bridge.  I then drilled the tibial tunnel using the retro-cutter, and opened the cortex with a reamer. All the soft tissue remnants were removed and cleaned at the aperture of the tunnel.  I  also dilated with the appropriate dilators after attempting to pass the graft once without dilation.  The passing suture was delivered through the tibia, and then the button and graft delivered up into the femoral tunnel.  The button  was flipped and confirmed under live fluoroscopy. I then tensioned the anterior cruciate ligament tightrope, and deliver the graft up into the femoral tunnel. Over 25 mm of graft was in the femoral tunnel. I confirmed once more with the fluoroscopy that the button was flipped appropriately on the femoral cortex.  I then cycled the knee, eliminated all of the creep, and I had excellent isometry. I then applied tension, and the Arthrex bio composite interference screw into the tibia placing a reverse Lachman maneuver on the tibia and femur. I removed the guide pin prior to completely seating the screw.  Excellent fixation was achieved on both the femoral and tibial side, and the wounds were irrigated copiously and the portals repaired with Monocryl with Steri-Strips and sterile gauze.  2 views of the left knee were then performed under live fluoroscopy demonstrating appropriate position of the tunnels and the button.  The patient was awakened and returned to PACU in stable and satisfactory condition. There were no complications and She tolerated the procedure well.

## 2023-01-20 NOTE — Anesthesia Procedure Notes (Signed)
Procedure Name: LMA Insertion Date/Time: 01/20/2023 7:57 AM  Performed by: Burna Cash, CRNAPre-anesthesia Checklist: Patient identified, Emergency Drugs available, Suction available and Patient being monitored Patient Re-evaluated:Patient Re-evaluated prior to induction Oxygen Delivery Method: Circle system utilized Preoxygenation: Pre-oxygenation with 100% oxygen Induction Type: IV induction Ventilation: Mask ventilation without difficulty LMA: LMA inserted LMA Size: 4.0 Number of attempts: 1 Airway Equipment and Method: Bite block Placement Confirmation: positive ETCO2 Tube secured with: Tape Dental Injury: Teeth and Oropharynx as per pre-operative assessment

## 2023-01-21 ENCOUNTER — Other Ambulatory Visit (HOSPITAL_COMMUNITY): Payer: Self-pay

## 2023-01-21 ENCOUNTER — Encounter (HOSPITAL_BASED_OUTPATIENT_CLINIC_OR_DEPARTMENT_OTHER): Payer: Self-pay | Admitting: Orthopedic Surgery

## 2023-02-02 ENCOUNTER — Ambulatory Visit
Admission: RE | Admit: 2023-02-02 | Discharge: 2023-02-02 | Disposition: A | Discharge status: Home / Self Care | Payer: No Typology Code available for payment source | Source: Ambulatory Visit | Attending: Gastroenterology | Admitting: Gastroenterology

## 2023-02-02 DIAGNOSIS — K769 Liver disease, unspecified: Secondary | ICD-10-CM | POA: Diagnosis not present

## 2023-02-02 DIAGNOSIS — S83282D Other tear of lateral meniscus, current injury, left knee, subsequent encounter: Secondary | ICD-10-CM | POA: Diagnosis not present

## 2023-02-06 DIAGNOSIS — S83272D Complex tear of lateral meniscus, current injury, left knee, subsequent encounter: Secondary | ICD-10-CM | POA: Diagnosis not present

## 2023-02-06 DIAGNOSIS — S83512D Sprain of anterior cruciate ligament of left knee, subsequent encounter: Secondary | ICD-10-CM | POA: Diagnosis not present

## 2023-02-06 DIAGNOSIS — R262 Difficulty in walking, not elsewhere classified: Secondary | ICD-10-CM | POA: Diagnosis not present

## 2023-02-06 DIAGNOSIS — M6281 Muscle weakness (generalized): Secondary | ICD-10-CM | POA: Diagnosis not present

## 2023-02-06 DIAGNOSIS — M25662 Stiffness of left knee, not elsewhere classified: Secondary | ICD-10-CM | POA: Diagnosis not present

## 2023-02-10 DIAGNOSIS — S83512D Sprain of anterior cruciate ligament of left knee, subsequent encounter: Secondary | ICD-10-CM | POA: Diagnosis not present

## 2023-02-10 DIAGNOSIS — R262 Difficulty in walking, not elsewhere classified: Secondary | ICD-10-CM | POA: Diagnosis not present

## 2023-02-10 DIAGNOSIS — M6281 Muscle weakness (generalized): Secondary | ICD-10-CM | POA: Diagnosis not present

## 2023-02-10 DIAGNOSIS — S83272D Complex tear of lateral meniscus, current injury, left knee, subsequent encounter: Secondary | ICD-10-CM | POA: Diagnosis not present

## 2023-02-10 DIAGNOSIS — M25662 Stiffness of left knee, not elsewhere classified: Secondary | ICD-10-CM | POA: Diagnosis not present

## 2023-02-11 ENCOUNTER — Other Ambulatory Visit: Payer: Self-pay | Admitting: Gastroenterology

## 2023-02-11 DIAGNOSIS — R262 Difficulty in walking, not elsewhere classified: Secondary | ICD-10-CM | POA: Diagnosis not present

## 2023-02-11 DIAGNOSIS — K769 Liver disease, unspecified: Secondary | ICD-10-CM

## 2023-02-11 DIAGNOSIS — S83272D Complex tear of lateral meniscus, current injury, left knee, subsequent encounter: Secondary | ICD-10-CM | POA: Diagnosis not present

## 2023-02-11 DIAGNOSIS — M6281 Muscle weakness (generalized): Secondary | ICD-10-CM | POA: Diagnosis not present

## 2023-02-11 DIAGNOSIS — M25662 Stiffness of left knee, not elsewhere classified: Secondary | ICD-10-CM | POA: Diagnosis not present

## 2023-02-11 DIAGNOSIS — S83512D Sprain of anterior cruciate ligament of left knee, subsequent encounter: Secondary | ICD-10-CM | POA: Diagnosis not present

## 2023-02-12 ENCOUNTER — Other Ambulatory Visit (HOSPITAL_COMMUNITY): Payer: Self-pay | Admitting: Gastroenterology

## 2023-02-12 DIAGNOSIS — K769 Liver disease, unspecified: Secondary | ICD-10-CM

## 2023-02-16 ENCOUNTER — Ambulatory Visit (HOSPITAL_COMMUNITY)
Admission: RE | Admit: 2023-02-16 | Discharge: 2023-02-16 | Disposition: A | Discharge status: Home / Self Care | Payer: 59 | Source: Ambulatory Visit | Attending: Gastroenterology | Admitting: Gastroenterology

## 2023-02-16 DIAGNOSIS — K769 Liver disease, unspecified: Secondary | ICD-10-CM | POA: Insufficient documentation

## 2023-02-16 DIAGNOSIS — K7689 Other specified diseases of liver: Secondary | ICD-10-CM | POA: Diagnosis not present

## 2023-02-16 MED ORDER — GADOBUTROL 1 MMOL/ML IV SOLN
7.0000 mL | Freq: Once | INTRAVENOUS | Status: AC | PRN
  Administered 2023-02-16: 7 mL via INTRAVENOUS

## 2023-02-17 DIAGNOSIS — S83272D Complex tear of lateral meniscus, current injury, left knee, subsequent encounter: Secondary | ICD-10-CM | POA: Diagnosis not present

## 2023-02-17 DIAGNOSIS — M6281 Muscle weakness (generalized): Secondary | ICD-10-CM | POA: Diagnosis not present

## 2023-02-17 DIAGNOSIS — S83512D Sprain of anterior cruciate ligament of left knee, subsequent encounter: Secondary | ICD-10-CM | POA: Diagnosis not present

## 2023-02-17 DIAGNOSIS — R262 Difficulty in walking, not elsewhere classified: Secondary | ICD-10-CM | POA: Diagnosis not present

## 2023-02-17 DIAGNOSIS — M25662 Stiffness of left knee, not elsewhere classified: Secondary | ICD-10-CM | POA: Diagnosis not present

## 2023-02-19 DIAGNOSIS — M6281 Muscle weakness (generalized): Secondary | ICD-10-CM | POA: Diagnosis not present

## 2023-02-19 DIAGNOSIS — S83512D Sprain of anterior cruciate ligament of left knee, subsequent encounter: Secondary | ICD-10-CM | POA: Diagnosis not present

## 2023-02-19 DIAGNOSIS — S83272D Complex tear of lateral meniscus, current injury, left knee, subsequent encounter: Secondary | ICD-10-CM | POA: Diagnosis not present

## 2023-02-19 DIAGNOSIS — R262 Difficulty in walking, not elsewhere classified: Secondary | ICD-10-CM | POA: Diagnosis not present

## 2023-02-19 DIAGNOSIS — M25662 Stiffness of left knee, not elsewhere classified: Secondary | ICD-10-CM | POA: Diagnosis not present

## 2023-02-24 DIAGNOSIS — R262 Difficulty in walking, not elsewhere classified: Secondary | ICD-10-CM | POA: Diagnosis not present

## 2023-02-24 DIAGNOSIS — M25662 Stiffness of left knee, not elsewhere classified: Secondary | ICD-10-CM | POA: Diagnosis not present

## 2023-02-24 DIAGNOSIS — M6281 Muscle weakness (generalized): Secondary | ICD-10-CM | POA: Diagnosis not present

## 2023-02-24 DIAGNOSIS — S83272D Complex tear of lateral meniscus, current injury, left knee, subsequent encounter: Secondary | ICD-10-CM | POA: Diagnosis not present

## 2023-02-24 DIAGNOSIS — S83512D Sprain of anterior cruciate ligament of left knee, subsequent encounter: Secondary | ICD-10-CM | POA: Diagnosis not present

## 2023-02-25 ENCOUNTER — Other Ambulatory Visit (HOSPITAL_COMMUNITY): Payer: Self-pay

## 2023-02-25 ENCOUNTER — Encounter (HOSPITAL_COMMUNITY): Payer: Self-pay

## 2023-03-03 DIAGNOSIS — S83512D Sprain of anterior cruciate ligament of left knee, subsequent encounter: Secondary | ICD-10-CM | POA: Diagnosis not present

## 2023-03-03 DIAGNOSIS — R262 Difficulty in walking, not elsewhere classified: Secondary | ICD-10-CM | POA: Diagnosis not present

## 2023-03-03 DIAGNOSIS — M6281 Muscle weakness (generalized): Secondary | ICD-10-CM | POA: Diagnosis not present

## 2023-03-03 DIAGNOSIS — M25662 Stiffness of left knee, not elsewhere classified: Secondary | ICD-10-CM | POA: Diagnosis not present

## 2023-03-03 DIAGNOSIS — S83272D Complex tear of lateral meniscus, current injury, left knee, subsequent encounter: Secondary | ICD-10-CM | POA: Diagnosis not present

## 2023-03-06 ENCOUNTER — Other Ambulatory Visit: Payer: Self-pay

## 2023-03-06 ENCOUNTER — Encounter (HOSPITAL_BASED_OUTPATIENT_CLINIC_OR_DEPARTMENT_OTHER): Payer: Self-pay | Admitting: Orthopedic Surgery

## 2023-03-09 NOTE — Progress Notes (Signed)

## 2023-03-09 NOTE — Discharge Instructions (Signed)
Diet: As you were doing prior to hospitalization   Shower:  May shower as soon as you would like.  Activity:  Increase activity slowly as tolerated, but follow the weight bearing instructions below.  The rules on driving is that you can not be taking narcotics while you drive, and you must feel in control of the vehicle.    Weight Bearing:   weight bearing as tolerated  To prevent constipation: you may use a stool softener such as -  Colace (over the counter) 100 mg by mouth twice a day  Drink plenty of fluids (prune juice may be helpful) and high fiber foods Miralax (over the counter) for constipation as needed.    Itching:  If you experience itching with your medications, try taking only a single pain pill, or even half a pain pill at a time.  You may take up to 10 pain pills per day, and you can also use benadryl over the counter for itching or also to help with sleep.   Precautions:  If you experience chest pain or shortness of breath - call 911 immediately for transfer to the hospital emergency department!!  If you develop a fever greater that 101 F, purulent drainage from wound, increased redness or drainage from wound, or calf pain -- Call the office at [phone]                                                Follow- Up Appointment:  Please call for an appointment to be seen in 2 weeks [place] - [phone]   Post Anesthesia Home Care Instructions  Activity: Get plenty of rest for the remainder of the day. A responsible individual must stay with you for 24 hours following the procedure.  For the next 24 hours, DO NOT: -Drive a car -Advertising copywriter -Drink alcoholic beverages -Take any medication unless instructed by your physician -Make any legal decisions or sign important papers.  Meals: Start with liquid foods such as gelatin or soup. Progress to regular foods as tolerated. Avoid greasy, spicy, heavy foods. If nausea and/or vomiting occur, drink only clear liquids  until the nausea and/or vomiting subsides. Call your physician if vomiting continues.  Special Instructions/Symptoms: Your throat may feel dry or sore from the anesthesia or the breathing tube placed in your throat during surgery. If this causes discomfort, gargle with warm salt water. The discomfort should disappear within 24 hours.  If you had a scopolamine patch placed behind your ear for the management of post- operative nausea and/or vomiting:  1. The medication in the patch is effective for 72 hours, after which it should be removed.  Wrap patch in a tissue and discard in the trash. Wash hands thoroughly with soap and water. 2. You may remove the patch earlier than 72 hours if you experience unpleasant side effects which may include dry mouth, dizziness or visual disturbances. 3. Avoid touching the patch. Wash your hands with soap and water after contact with the patch.    Regional Anesthesia Blocks  1. You may not be able to move or feel the "blocked" extremity after a regional anesthetic block. This may last may last from 3-48 hours after placement, but it will go away. The length of time depends on the medication injected and your individual response to the medication. As the nerves start to wake up,  you may experience tingling as the movement and feeling returns to your extremity. If the numbness and inability to move your extremity has not gone away after 48 hours, please call your surgeon.   2. The extremity that is blocked will need to be protected until the numbness is gone and the strength has returned. Because you cannot feel it, you will need to take extra care to avoid injury. Because it may be weak, you may have difficulty moving it or using it. You may not know what position it is in without looking at it while the block is in effect.  3. For blocks in the legs and feet, returning to weight bearing and walking needs to be done carefully. You will need to wait until the numbness  is entirely gone and the strength has returned. You should be able to move your leg and foot normally before you try and bear weight or walk. You will need someone to be with you when you first try to ensure you do not fall and possibly risk injury.  4. Bruising and tenderness at the needle site are common side effects and will resolve in a few days.  5. Persistent numbness or new problems with movement should be communicated to the surgeon or the [facility] [phone] [facility] [phone]).  Next dose of tylenol if need is after 3pm

## 2023-03-09 NOTE — H&P (Signed)
PREOPERATIVE H&P  Chief Complaint: Right knee stiffness after ACL reconstruction  HPI: Andrea Dean is a 37 y.o. female who presents for a follow-up on her ACL reconstruction with a partial lateral meniscectomy performed 01-20-23. Overall, she is doing well, however she is worried about her mobility. She has been able to get back about 6-90 degrees of range of motion with physical therapy. PT approached the topic of a possible manipulation  as she is not getting back full extension. 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 She is trying to walk with her leg bent is causing back pain as well. She would like to try a manipulation to see if she can regain some of her ROM.  Past Medical History:  Diagnosis Date   Abnormal Pap smear    repeat WNL   ACL tear    Acne    Chlamydia    Fracture closed, fibula, shaft 07/21/2005   left, no surgical repair   GERD (gastroesophageal reflux disease)    H/O seasonal allergies    Herpes genitalis in women    MRSA (methicillin resistant staph aureus) culture positive    Ovarian cyst    PONV (postoperative nausea and vomiting)    Preterm labor    Thrombocytopenia (HCC)    2006 DURING PREGNANCY - NO PROBLEMS WITH DELIVERY OR ANY KNOWN PROBLEMS SINCE   Urinary tract infection    Urticaria    Past Surgical History:  Procedure Laterality Date   CESAREAN SECTION     I & D OF PILONIADAL CYST IN OFFICE - LOCAL - FELT JITTERY AFTERWARDS     KNEE ARTHROSCOPY WITH LATERAL MENISECTOMY Left 01/20/2023   Procedure: KNEE ARTHROSCOPY WITH LATERAL MENISECTOMY;  Surgeon: Teryl Lucy, MD;  Location: [facility];  Service: Orthopedics;  Laterality: Left;   PILONIDAL CYST EXCISION N/A 05/05/2014   Procedure:  EXCISION PILONIDAL CYST;  Surgeon: Romie Levee, MD;  Location: [facility];  Service: General;  Laterality: N/A;   WISDOM TOOTH  EXTRACTION  07/21/2002   Social History   Socioeconomic History   Marital status: Married    Spouse name: Not on file   Number of children: Not on file   Years of education: Not on file   Highest education level: Not on file  Occupational History   Not on file  Tobacco Use   Smoking status: Never    Passive exposure: Never   Smokeless tobacco: Never  Vaping Use   Vaping status: Never Used  Substance and Sexual Activity   Alcohol use: No   Drug use: No   Sexual activity: Yes    Partners: Male    Comment: husband has had vasectomy  Other Topics Concern   Not on file  Social History Narrative   Not on file   Social Determinants of Health   Financial Resource Strain: Not on file  Food Insecurity: Not on file  Transportation Needs: Not on file  Physical Activity: Not on file  Stress: Not on file  Social Connections: Not on file   Family History  Problem Relation Age of Onset   Cancer Paternal Grandmother        brain, lung   Diabetes Paternal Grandmother    Heart disease Paternal Grandmother    Hypertension Paternal Grandmother    Hypertension Maternal Grandmother    Stroke Maternal  Grandmother    Anesthesia problems Neg Hx    Hypotension Neg Hx    Malignant hyperthermia Neg Hx    Pseudochol deficiency Neg Hx    Allergic rhinitis Neg Hx    Asthma Neg Hx    Angioedema Neg Hx    Eczema Neg Hx    Immunodeficiency Neg Hx    Urticaria Neg Hx    Allergies  Allergen Reactions   Flagyl [Metronidazole Hcl] Hives and Nausea And Vomiting   Latex     sensitive   Prior to Admission medications   Medication Sig Start Date End Date Taking? Authorizing Provider  cetirizine (ZYRTEC) 10 MG tablet May take up to 2 tablets in morning and 2 tablets in evening for hives prevention and treatment. This is maximum dose. Titrate as needed. 12/12/22  Yes Ambs, Norvel Richards, FNP  valACYclovir (VALTREX) 1000 MG tablet Take 1 tablet (1,000 mg total) by mouth daily. 12/25/22  Yes   Vitamin D,  Ergocalciferol, 50000 units CAPS take 1 capsule by mouth once a week for 12 weeks 01/20/23  Yes      Positive ROS: All other systems have been reviewed and were otherwise negative with the exception of those mentioned in the HPI and as above.  Physical Exam: General: Alert, no acute distress Cardiovascular: No pedal edema Respiratory: No cyanosis, no use of accessory musculature GI: No organomegaly, abdomen is soft and non-tender Skin: No lesions in the area of chief complaint Neurologic: Sensation intact distally Psychiatric: Patient is competent for consent with normal mood and affect Lymphatic: No axillary or cervical lymphadenopathy  MUSCULOSKELETAL: On examination the surgical incisions are healing well. No signs of infection. The range of motion is from 10-95 degrees while seated on the examination table today.   Assessment: Left ACL reconstruction , partial lateral meniscectomy, post operative arthrofibrosis   Plan: She is frustrated regarding her extension and she feels like she should be beyond this range of motion at this point. 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 She would really like to think about pursuing a manipulation of the knee. She has spoken to physical therapy about this. We had an extensive discussion about the risks and benefits of a knee manipulation and that this may not get back her range of motion to the level she would like for it to, especially on extension. After discussion, she has elected to move forward with the left knee manipulation under anesthesia.    Plan for Procedure(s): CLOSED MANIPULATION KNEE  The risks benefits and alternatives were discussed with the patient including but not limited to the risks of nonoperative treatment, versus surgical intervention including infection, bleeding, nerve injury,  blood clots, cardiopulmonary complications, morbidity, mortality, among others, and they were willing to proceed.   Armida Sans, PA-C   03/09/2023 12:42 PM

## 2023-03-10 ENCOUNTER — Other Ambulatory Visit: Payer: Self-pay

## 2023-03-10 ENCOUNTER — Encounter (HOSPITAL_BASED_OUTPATIENT_CLINIC_OR_DEPARTMENT_OTHER)
Admission: RE | Disposition: A | Discharge status: Home / Self Care | Payer: Self-pay | Source: Home / Self Care | Attending: Orthopedic Surgery

## 2023-03-10 ENCOUNTER — Encounter (HOSPITAL_BASED_OUTPATIENT_CLINIC_OR_DEPARTMENT_OTHER): Payer: Self-pay | Admitting: Orthopedic Surgery

## 2023-03-10 ENCOUNTER — Other Ambulatory Visit (HOSPITAL_COMMUNITY): Payer: Self-pay

## 2023-03-10 ENCOUNTER — Ambulatory Visit (HOSPITAL_BASED_OUTPATIENT_CLINIC_OR_DEPARTMENT_OTHER)
Admission: RE | Admit: 2023-03-10 | Discharge: 2023-03-10 | Disposition: A | Discharge status: Home / Self Care | Payer: 59 | Attending: Orthopedic Surgery | Admitting: Orthopedic Surgery

## 2023-03-10 ENCOUNTER — Ambulatory Visit (HOSPITAL_BASED_OUTPATIENT_CLINIC_OR_DEPARTMENT_OTHER): Payer: 59 | Admitting: Certified Registered Nurse Anesthetist

## 2023-03-10 DIAGNOSIS — G8918 Other acute postprocedural pain: Secondary | ICD-10-CM | POA: Diagnosis not present

## 2023-03-10 DIAGNOSIS — M24662 Ankylosis, left knee: Secondary | ICD-10-CM | POA: Insufficient documentation

## 2023-03-10 DIAGNOSIS — Z9889 Other specified postprocedural states: Secondary | ICD-10-CM | POA: Diagnosis not present

## 2023-03-10 DIAGNOSIS — Z01818 Encounter for other preprocedural examination: Secondary | ICD-10-CM

## 2023-03-10 HISTORY — PX: KNEE CLOSED REDUCTION: SHX995

## 2023-03-10 HISTORY — DX: Other specified postprocedural states: Z98.890

## 2023-03-10 LAB — POCT PREGNANCY, URINE: Preg Test, Ur: NEGATIVE

## 2023-03-10 SURGERY — MANIPULATION, KNEE, CLOSED
Anesthesia: General | Site: Knee | Laterality: Left

## 2023-03-10 MED ORDER — FENTANYL CITRATE (PF) 100 MCG/2ML IJ SOLN
INTRAMUSCULAR | Status: AC
  Filled 2023-03-10: qty 2

## 2023-03-10 MED ORDER — OXYCODONE HCL 5 MG/5ML PO SOLN: 5.0000 mg | Freq: Once | ORAL | Status: AC | PRN

## 2023-03-10 MED ORDER — MIDAZOLAM HCL 2 MG/2ML IJ SOLN
2.0000 mg | Freq: Once | INTRAMUSCULAR | Status: AC
  Administered 2023-03-10: 1 mg via INTRAVENOUS

## 2023-03-10 MED ORDER — FENTANYL CITRATE (PF) 100 MCG/2ML IJ SOLN
100.0000 ug | Freq: Once | INTRAMUSCULAR | Status: AC
  Administered 2023-03-10: 50 ug via INTRAVENOUS

## 2023-03-10 MED ORDER — METHYLPREDNISOLONE ACETATE 40 MG/ML IJ SUSP
INTRAMUSCULAR | Status: AC
  Filled 2023-03-10: qty 1

## 2023-03-10 MED ORDER — PROPOFOL 10 MG/ML IV BOLUS
INTRAVENOUS | Status: DC | PRN
Start: 2023-03-10 — End: 2023-03-10
  Administered 2023-03-10: 150 mg via INTRAVENOUS

## 2023-03-10 MED ORDER — PROMETHAZINE HCL 25 MG/ML IJ SOLN: 6.2500 mg | INTRAMUSCULAR | Status: DC | PRN

## 2023-03-10 MED ORDER — ONDANSETRON HCL 4 MG/2ML IJ SOLN
INTRAMUSCULAR | Status: DC | PRN
Start: 2023-03-10 — End: 2023-03-10
  Administered 2023-03-10: 4 mg via INTRAVENOUS

## 2023-03-10 MED ORDER — ONDANSETRON HCL 4 MG/2ML IJ SOLN
INTRAMUSCULAR | Status: AC
  Filled 2023-03-10: qty 2

## 2023-03-10 MED ORDER — FENTANYL CITRATE (PF) 250 MCG/5ML IJ SOLN
INTRAMUSCULAR | Status: DC | PRN
  Administered 2023-03-10: 50 ug via INTRAVENOUS

## 2023-03-10 MED ORDER — OXYCODONE HCL 5 MG PO TABS
5.0000 mg | ORAL_TABLET | Freq: Once | ORAL | Status: AC | PRN
  Administered 2023-03-10: 5 mg via ORAL

## 2023-03-10 MED ORDER — SCOPOLAMINE 1 MG/3DAYS TD PT72
1.0000 | MEDICATED_PATCH | TRANSDERMAL | Status: DC
  Administered 2023-03-10: 1.5 mg via TRANSDERMAL

## 2023-03-10 MED ORDER — ACETAMINOPHEN 500 MG PO TABS
ORAL_TABLET | ORAL | Status: AC
  Filled 2023-03-10: qty 2

## 2023-03-10 MED ORDER — LACTATED RINGERS IV SOLN: INTRAVENOUS | Status: DC

## 2023-03-10 MED ORDER — ROPIVACAINE HCL 7.5 MG/ML IJ SOLN
INTRAMUSCULAR | Status: DC | PRN
  Administered 2023-03-10: 20 mL via PERINEURAL

## 2023-03-10 MED ORDER — HYDROCODONE-ACETAMINOPHEN 5-325 MG PO TABS
1.0000 | ORAL_TABLET | Freq: Four times a day (QID) | ORAL | 0 refills | Status: DC | PRN
Start: 2023-03-10 — End: 2023-12-21
  Filled 2023-03-10: qty 10, 3d supply, fill #0

## 2023-03-10 MED ORDER — MEPERIDINE HCL 25 MG/ML IJ SOLN: 6.2500 mg | INTRAMUSCULAR | Status: DC | PRN

## 2023-03-10 MED ORDER — LIDOCAINE 2% (20 MG/ML) 5 ML SYRINGE
INTRAMUSCULAR | Status: AC
  Filled 2023-03-10: qty 5

## 2023-03-10 MED ORDER — MIDAZOLAM HCL 2 MG/2ML IJ SOLN
INTRAMUSCULAR | Status: AC
  Filled 2023-03-10: qty 2

## 2023-03-10 MED ORDER — PROPOFOL 500 MG/50ML IV EMUL
INTRAVENOUS | Status: DC | PRN
  Administered 2023-03-10: 150 ug/kg/min via INTRAVENOUS

## 2023-03-10 MED ORDER — MIDAZOLAM HCL 2 MG/2ML IJ SOLN: 0.5000 mg | Freq: Once | INTRAMUSCULAR | Status: DC | PRN

## 2023-03-10 MED ORDER — BUPIVACAINE HCL (PF) 0.5 % IJ SOLN
INTRAMUSCULAR | Status: AC
  Filled 2023-03-10: qty 30

## 2023-03-10 MED ORDER — SCOPOLAMINE 1 MG/3DAYS TD PT72
MEDICATED_PATCH | TRANSDERMAL | Status: AC
  Filled 2023-03-10: qty 1

## 2023-03-10 MED ORDER — ACETAMINOPHEN 500 MG PO TABS
1000.0000 mg | ORAL_TABLET | Freq: Once | ORAL | Status: AC
  Administered 2023-03-10: 1000 mg via ORAL

## 2023-03-10 MED ORDER — HYDROMORPHONE HCL 1 MG/ML IJ SOLN: 0.2500 mg | INTRAMUSCULAR | Status: DC | PRN

## 2023-03-10 MED ORDER — MIDAZOLAM HCL 2 MG/2ML IJ SOLN
INTRAMUSCULAR | Status: DC | PRN
  Administered 2023-03-10: 2 mg via INTRAVENOUS

## 2023-03-10 MED ORDER — OXYCODONE HCL 5 MG PO TABS
ORAL_TABLET | ORAL | Status: AC
  Filled 2023-03-10: qty 1

## 2023-03-10 SURGICAL SUPPLY — 9 items
BNDG ADH 1X3 SHEER STRL LF (GAUZE/BANDAGES/DRESSINGS) ×1 IMPLANT
BNDG ADH THN 3X1 STRL LF (GAUZE/BANDAGES/DRESSINGS)
GLOVE BIO SURGEON STRL SZ7 (GLOVE) ×1 IMPLANT
GLOVE BIOGEL PI IND STRL 7.0 (GLOVE) ×1 IMPLANT
NDL SAFETY ECLIP 18X1.5 (MISCELLANEOUS) ×1 IMPLANT
NDL SPNL 22GX3.5 QUINCKE BK (NEEDLE) ×1 IMPLANT
NEEDLE SPNL 22GX3.5 QUINCKE BK (NEEDLE)
PAD ALCOHOL SWAB (MISCELLANEOUS) ×2 IMPLANT
SYR 20ML LL LF (SYRINGE) ×1 IMPLANT

## 2023-03-10 NOTE — Anesthesia Postprocedure Evaluation (Signed)
Anesthesia Post Note  Patient: Andrea Dean  Procedure(s) Performed: CLOSED MANIPULATION LEFT KNEE (Left: Knee)     Patient location during evaluation: Phase II Anesthesia Type: General Level of consciousness: awake and alert, oriented and patient cooperative Pain management: pain level controlled Vital Signs Assessment: post-procedure vital signs reviewed and stable Respiratory status: spontaneous breathing, nonlabored ventilation and respiratory function stable Cardiovascular status: blood pressure returned to baseline and stable Postop Assessment: no apparent nausea or vomiting and adequate PO intake Anesthetic complications: no   No notable events documented.  Last Vitals:  Vitals:   03/10/23 1036 03/10/23 1106  BP: 114/66 110/66  Pulse: 81 78  Resp: 12 20  Temp:  (!) 36.1 C  SpO2: 98% 100%    Last Pain:  Vitals:   03/10/23 1106  TempSrc: Oral  PainSc: 3                  Andrea Dean,Andrea Dean

## 2023-03-10 NOTE — Progress Notes (Signed)
Assisted Dr. Annye Asa with left, adductor canal, ultrasound guided block. Side rails up, monitors on throughout procedure. See vital signs in flow sheet. Tolerated Procedure well. ?

## 2023-03-10 NOTE — Anesthesia Procedure Notes (Signed)
Anesthesia Regional Block: Adductor canal block   Pre-Anesthetic Checklist: , timeout performed,  Correct Patient, Correct Site, Correct Laterality,  Correct Procedure, Correct Position, site marked,  Risks and benefits discussed,  Surgical consent,  Pre-op evaluation,  At surgeon's request and post-op pain management  Laterality: Left and Lower  Prep: chloraprep       Needles:  Injection technique: Single-shot  Needle Type: Echogenic Needle     Needle Length: 9cm  Needle Gauge: 21     Additional Needles:   Procedures:,,,, ultrasound used (permanent image in chart),,    Narrative:  Start time: 03/10/2023 9:09 AM End time: 03/10/2023 9:16 AM Injection made incrementally with aspirations every 5 mL.  Performed by: Personally  Anesthesiologist: Jairo Ben, MD  Additional Notes: Pt identified in Holding room.  Monitors applied. Working IV access confirmed. Timeout, Sterile prep L thigh.  #21ga ECHOgenic Arrow block needle into adductor canal with US guidance.  20cc 0.75% Ropivacaine injected incrementally after negative test dose.  Patient asymptomatic, VSS, no heme aspirated, tolerated well.   Sandford Craze, MD

## 2023-03-10 NOTE — Op Note (Signed)
03/10/2023  3:23 PM  PATIENT:  Andrea Dean    PRE-OPERATIVE DIAGNOSIS: Left knee arthrofibrosis, 6 weeks status post ACL reconstruction  POST-OPERATIVE DIAGNOSIS:  Same  PROCEDURE:  CLOSED MANIPULATION LEFT KNEE  SURGEON:  Eulas Post, MD  PHYSICIAN ASSISTANT: Janine Ores, PA-C, present and scrubbed throughout the case, critical for completion in a timely fashion, and for retraction, instrumentation, and closure.  ANESTHESIA:   General  PREOPERATIVE INDICATIONS:  Andrea Dean is a  37 y.o. female with a diagnosis of ANKYLOSIS LEFT KNEE who failed conservative measures and elected for surgical management.  She is very anxious about regaining her motion, and her preoperative motion was about 10 degrees to almost 90 degrees, but she wanted to get back to work on get her motion back as quickly as possible and wished for manipulation.  The risks benefits and alternatives were discussed with the patient preoperatively including but not limited to the risks of infection, bleeding, nerve injury, recurrent stiffness, the potential that this could represent infection, recurrent ACL tear, cardiopulmonary complications, the need for revision surgery, among others, and the patient was willing to proceed.  ESTIMATED BLOOD LOSS: None  OPERATIVE FINDINGS: Her leg reached full extension while under anesthesia fairly easily.  Manipulation did yield lysis of adhesions.  OPERATIVE PROCEDURE: The patient was brought to the operating room and placed in the supine position.  She had an adductor block in the preoperative holding area, and that actually had restored full extension.  When she was under anesthesia, timeout performed, the left knee was manipulated, I gently applied some pressure to try and achieve full extension, and even a little bit of hyperextension.  I did this very delicately.  I then went into flexion, and had satisfactory lysis of adhesion, and was able to achieve 130 degrees of  flexion, and the knee dropped to 120 degrees without any pressure.  She was awakened and returned to the PACU in stable and satisfactory condition.  Her Lachman was intact at the completion of the manipulation.

## 2023-03-10 NOTE — Interval H&P Note (Signed)
History and Physical Interval Note:  03/10/2023 9:38 AM  Andrea Dean  has presented today for surgery, with the diagnosis of Arthrofibrosis LEFT KNEE.  The various methods of treatment have been discussed with the patient and family. After consideration of risks, benefits and other options for treatment, the patient has consented to  Procedure(s): CLOSED MANIPULATION LEFT KNEE (Left) as a surgical intervention.  The patient's history has been reviewed, patient examined, no change in status, stable for surgery.  I have reviewed the patient's chart and labs.  Questions were answered to the patient's satisfaction.     Eulas Post

## 2023-03-10 NOTE — Transfer of Care (Signed)
Immediate Anesthesia Transfer of Care Note  Patient: Andrea Dean  Procedure(s) Performed: CLOSED MANIPULATION LEFT KNEE (Left: Knee)  Patient Location: PACU  Anesthesia Type:General  Level of Consciousness: awake, alert , and oriented  Airway & Oxygen Therapy: Patient Spontanous Breathing and Patient connected to face mask oxygen  Post-op Assessment: Report given to RN and Post -op Vital signs reviewed and stable  Post vital signs: Reviewed and stable  Last Vitals:  Vitals Value Taken Time  BP 106/84 03/10/23 1015  Temp 36.9 C 03/10/23 1013  Pulse 94 03/10/23 1028  Resp 17 03/10/23 1028  SpO2 100 % 03/10/23 1028  Vitals shown include unfiled device data.  Last Pain:  Vitals:   03/10/23 1013  TempSrc:   PainSc: 4       Patients Stated Pain Goal: 3 (03/10/23 0856)  Complications: No notable events documented.

## 2023-03-10 NOTE — Anesthesia Preprocedure Evaluation (Addendum)
Anesthesia Evaluation  Patient identified by MRN, date of birth, ID band Patient awake    Reviewed: Allergy & Precautions, NPO status , Patient's Chart, lab work & pertinent test results  History of Anesthesia Complications (+) PONV  Airway Mallampati: II  TM Distance: >3 FB Neck ROM: Full    Dental  (+) Dental Advisory Given   Pulmonary neg pulmonary ROS   breath sounds clear to auscultation       Cardiovascular (-) hypertension(-) angina  Rhythm:Regular Rate:Normal  '19 ECHO:  - Left ventricle: The cavity size was normal. Wall thickness was normal. There are increased trabeculations at the left ventricular apex. Systolic function was normal. Wall motion was normal; no regional wall motion abnormalities. Left ventricular diastolic function parameters were normal. No left ventricular outflow tract obstruction.  - Aortic valve: Trileaflet; normal thickness leaflets. Transvalvular velocity was within the normal range. There was no stenosis. There was no regurgitation. Mean gradient (S): 5 mm Hg.  - Mitral valve: Structurally normal valve. Transvalvular velocity was within the normal range. There was no evidence for stenosis. There was no regurgitation.  - Left atrium: The atrium was normal in size.  - Right ventricle: The cavity size was normal. Wall thickness was normal. Systolic function was normal.     Neuro/Psych    GI/Hepatic Neg liver ROS,GERD  Controlled,,  Endo/Other  BMI 29  Renal/GU negative Renal ROS     Musculoskeletal   Abdominal   Peds  Hematology   Anesthesia Other Findings   Reproductive/Obstetrics                             Anesthesia Physical Anesthesia Plan  ASA: 2  Anesthesia Plan: General   Post-op Pain Management: Tylenol PO (pre-op)* and Regional block*   Induction: Intravenous  PONV Risk Score and Plan: 4 or greater and Ondansetron, Dexamethasone and Scopolamine  patch - Pre-op  Airway Management Planned: Mask and Natural Airway  Additional Equipment: None  Intra-op Plan:   Post-operative Plan:   Informed Consent: I have reviewed the patients History and Physical, chart, labs and discussed the procedure including the risks, benefits and alternatives for the proposed anesthesia with the patient or authorized representative who has indicated his/her understanding and acceptance.     Dental advisory given  Plan Discussed with: CRNA and Surgeon  Anesthesia Plan Comments: (Plan routine monitors, GA with adductor canal block for post op analgesia)        Anesthesia Quick Evaluation

## 2023-03-11 ENCOUNTER — Encounter (HOSPITAL_BASED_OUTPATIENT_CLINIC_OR_DEPARTMENT_OTHER): Payer: Self-pay | Admitting: Orthopedic Surgery

## 2023-03-11 DIAGNOSIS — S83512D Sprain of anterior cruciate ligament of left knee, subsequent encounter: Secondary | ICD-10-CM | POA: Diagnosis not present

## 2023-03-11 DIAGNOSIS — R262 Difficulty in walking, not elsewhere classified: Secondary | ICD-10-CM | POA: Diagnosis not present

## 2023-03-11 DIAGNOSIS — M25662 Stiffness of left knee, not elsewhere classified: Secondary | ICD-10-CM | POA: Diagnosis not present

## 2023-03-11 DIAGNOSIS — S83272D Complex tear of lateral meniscus, current injury, left knee, subsequent encounter: Secondary | ICD-10-CM | POA: Diagnosis not present

## 2023-03-11 DIAGNOSIS — M6281 Muscle weakness (generalized): Secondary | ICD-10-CM | POA: Diagnosis not present

## 2023-03-12 DIAGNOSIS — S83512D Sprain of anterior cruciate ligament of left knee, subsequent encounter: Secondary | ICD-10-CM | POA: Diagnosis not present

## 2023-03-12 DIAGNOSIS — R262 Difficulty in walking, not elsewhere classified: Secondary | ICD-10-CM | POA: Diagnosis not present

## 2023-03-12 DIAGNOSIS — M25662 Stiffness of left knee, not elsewhere classified: Secondary | ICD-10-CM | POA: Diagnosis not present

## 2023-03-12 DIAGNOSIS — M6281 Muscle weakness (generalized): Secondary | ICD-10-CM | POA: Diagnosis not present

## 2023-03-12 DIAGNOSIS — S83272D Complex tear of lateral meniscus, current injury, left knee, subsequent encounter: Secondary | ICD-10-CM | POA: Diagnosis not present

## 2023-03-13 DIAGNOSIS — S83272D Complex tear of lateral meniscus, current injury, left knee, subsequent encounter: Secondary | ICD-10-CM | POA: Diagnosis not present

## 2023-03-13 DIAGNOSIS — R262 Difficulty in walking, not elsewhere classified: Secondary | ICD-10-CM | POA: Diagnosis not present

## 2023-03-13 DIAGNOSIS — M6281 Muscle weakness (generalized): Secondary | ICD-10-CM | POA: Diagnosis not present

## 2023-03-13 DIAGNOSIS — S83512D Sprain of anterior cruciate ligament of left knee, subsequent encounter: Secondary | ICD-10-CM | POA: Diagnosis not present

## 2023-03-13 DIAGNOSIS — M25662 Stiffness of left knee, not elsewhere classified: Secondary | ICD-10-CM | POA: Diagnosis not present

## 2023-03-16 DIAGNOSIS — M6281 Muscle weakness (generalized): Secondary | ICD-10-CM | POA: Diagnosis not present

## 2023-03-16 DIAGNOSIS — R262 Difficulty in walking, not elsewhere classified: Secondary | ICD-10-CM | POA: Diagnosis not present

## 2023-03-16 DIAGNOSIS — S83272D Complex tear of lateral meniscus, current injury, left knee, subsequent encounter: Secondary | ICD-10-CM | POA: Diagnosis not present

## 2023-03-16 DIAGNOSIS — M25662 Stiffness of left knee, not elsewhere classified: Secondary | ICD-10-CM | POA: Diagnosis not present

## 2023-03-16 DIAGNOSIS — S83512D Sprain of anterior cruciate ligament of left knee, subsequent encounter: Secondary | ICD-10-CM | POA: Diagnosis not present

## 2023-03-18 DIAGNOSIS — M25662 Stiffness of left knee, not elsewhere classified: Secondary | ICD-10-CM | POA: Diagnosis not present

## 2023-03-18 DIAGNOSIS — R262 Difficulty in walking, not elsewhere classified: Secondary | ICD-10-CM | POA: Diagnosis not present

## 2023-03-18 DIAGNOSIS — S83512D Sprain of anterior cruciate ligament of left knee, subsequent encounter: Secondary | ICD-10-CM | POA: Diagnosis not present

## 2023-03-18 DIAGNOSIS — S83272D Complex tear of lateral meniscus, current injury, left knee, subsequent encounter: Secondary | ICD-10-CM | POA: Diagnosis not present

## 2023-03-18 DIAGNOSIS — M6281 Muscle weakness (generalized): Secondary | ICD-10-CM | POA: Diagnosis not present

## 2023-03-20 ENCOUNTER — Other Ambulatory Visit (HOSPITAL_COMMUNITY): Payer: Self-pay

## 2023-03-20 DIAGNOSIS — R142 Eructation: Secondary | ICD-10-CM | POA: Diagnosis not present

## 2023-03-20 DIAGNOSIS — R109 Unspecified abdominal pain: Secondary | ICD-10-CM | POA: Diagnosis not present

## 2023-03-20 DIAGNOSIS — R12 Heartburn: Secondary | ICD-10-CM | POA: Diagnosis not present

## 2023-03-20 DIAGNOSIS — M25662 Stiffness of left knee, not elsewhere classified: Secondary | ICD-10-CM | POA: Diagnosis not present

## 2023-03-20 DIAGNOSIS — K5900 Constipation, unspecified: Secondary | ICD-10-CM | POA: Diagnosis not present

## 2023-03-20 DIAGNOSIS — R140 Abdominal distension (gaseous): Secondary | ICD-10-CM | POA: Diagnosis not present

## 2023-03-25 DIAGNOSIS — S83512D Sprain of anterior cruciate ligament of left knee, subsequent encounter: Secondary | ICD-10-CM | POA: Diagnosis not present

## 2023-03-25 DIAGNOSIS — S83272D Complex tear of lateral meniscus, current injury, left knee, subsequent encounter: Secondary | ICD-10-CM | POA: Diagnosis not present

## 2023-03-25 DIAGNOSIS — M6281 Muscle weakness (generalized): Secondary | ICD-10-CM | POA: Diagnosis not present

## 2023-03-25 DIAGNOSIS — M25662 Stiffness of left knee, not elsewhere classified: Secondary | ICD-10-CM | POA: Diagnosis not present

## 2023-03-25 DIAGNOSIS — R262 Difficulty in walking, not elsewhere classified: Secondary | ICD-10-CM | POA: Diagnosis not present

## 2023-03-27 DIAGNOSIS — M25662 Stiffness of left knee, not elsewhere classified: Secondary | ICD-10-CM | POA: Diagnosis not present

## 2023-03-27 DIAGNOSIS — M6281 Muscle weakness (generalized): Secondary | ICD-10-CM | POA: Diagnosis not present

## 2023-03-27 DIAGNOSIS — R262 Difficulty in walking, not elsewhere classified: Secondary | ICD-10-CM | POA: Diagnosis not present

## 2023-03-27 DIAGNOSIS — S83512D Sprain of anterior cruciate ligament of left knee, subsequent encounter: Secondary | ICD-10-CM | POA: Diagnosis not present

## 2023-03-27 DIAGNOSIS — S83272D Complex tear of lateral meniscus, current injury, left knee, subsequent encounter: Secondary | ICD-10-CM | POA: Diagnosis not present

## 2023-03-30 DIAGNOSIS — M25662 Stiffness of left knee, not elsewhere classified: Secondary | ICD-10-CM | POA: Diagnosis not present

## 2023-03-31 DIAGNOSIS — M25662 Stiffness of left knee, not elsewhere classified: Secondary | ICD-10-CM | POA: Diagnosis not present

## 2023-03-31 DIAGNOSIS — R262 Difficulty in walking, not elsewhere classified: Secondary | ICD-10-CM | POA: Diagnosis not present

## 2023-03-31 DIAGNOSIS — S83512D Sprain of anterior cruciate ligament of left knee, subsequent encounter: Secondary | ICD-10-CM | POA: Diagnosis not present

## 2023-03-31 DIAGNOSIS — S83272D Complex tear of lateral meniscus, current injury, left knee, subsequent encounter: Secondary | ICD-10-CM | POA: Diagnosis not present

## 2023-03-31 DIAGNOSIS — M6281 Muscle weakness (generalized): Secondary | ICD-10-CM | POA: Diagnosis not present

## 2023-04-02 DIAGNOSIS — M6281 Muscle weakness (generalized): Secondary | ICD-10-CM | POA: Diagnosis not present

## 2023-04-02 DIAGNOSIS — S83272D Complex tear of lateral meniscus, current injury, left knee, subsequent encounter: Secondary | ICD-10-CM | POA: Diagnosis not present

## 2023-04-02 DIAGNOSIS — S83512D Sprain of anterior cruciate ligament of left knee, subsequent encounter: Secondary | ICD-10-CM | POA: Diagnosis not present

## 2023-04-02 DIAGNOSIS — R262 Difficulty in walking, not elsewhere classified: Secondary | ICD-10-CM | POA: Diagnosis not present

## 2023-04-02 DIAGNOSIS — M25662 Stiffness of left knee, not elsewhere classified: Secondary | ICD-10-CM | POA: Diagnosis not present

## 2023-04-07 DIAGNOSIS — R262 Difficulty in walking, not elsewhere classified: Secondary | ICD-10-CM | POA: Diagnosis not present

## 2023-04-07 DIAGNOSIS — S83272D Complex tear of lateral meniscus, current injury, left knee, subsequent encounter: Secondary | ICD-10-CM | POA: Diagnosis not present

## 2023-04-07 DIAGNOSIS — M6281 Muscle weakness (generalized): Secondary | ICD-10-CM | POA: Diagnosis not present

## 2023-04-07 DIAGNOSIS — S83512D Sprain of anterior cruciate ligament of left knee, subsequent encounter: Secondary | ICD-10-CM | POA: Diagnosis not present

## 2023-04-07 DIAGNOSIS — M25662 Stiffness of left knee, not elsewhere classified: Secondary | ICD-10-CM | POA: Diagnosis not present

## 2023-04-09 DIAGNOSIS — M6281 Muscle weakness (generalized): Secondary | ICD-10-CM | POA: Diagnosis not present

## 2023-04-09 DIAGNOSIS — M25662 Stiffness of left knee, not elsewhere classified: Secondary | ICD-10-CM | POA: Diagnosis not present

## 2023-04-09 DIAGNOSIS — S83272D Complex tear of lateral meniscus, current injury, left knee, subsequent encounter: Secondary | ICD-10-CM | POA: Diagnosis not present

## 2023-04-09 DIAGNOSIS — S83512D Sprain of anterior cruciate ligament of left knee, subsequent encounter: Secondary | ICD-10-CM | POA: Diagnosis not present

## 2023-04-09 DIAGNOSIS — R262 Difficulty in walking, not elsewhere classified: Secondary | ICD-10-CM | POA: Diagnosis not present

## 2023-04-14 DIAGNOSIS — M6281 Muscle weakness (generalized): Secondary | ICD-10-CM | POA: Diagnosis not present

## 2023-04-14 DIAGNOSIS — S83272D Complex tear of lateral meniscus, current injury, left knee, subsequent encounter: Secondary | ICD-10-CM | POA: Diagnosis not present

## 2023-04-14 DIAGNOSIS — M25662 Stiffness of left knee, not elsewhere classified: Secondary | ICD-10-CM | POA: Diagnosis not present

## 2023-04-14 DIAGNOSIS — R262 Difficulty in walking, not elsewhere classified: Secondary | ICD-10-CM | POA: Diagnosis not present

## 2023-04-14 DIAGNOSIS — S83512D Sprain of anterior cruciate ligament of left knee, subsequent encounter: Secondary | ICD-10-CM | POA: Diagnosis not present

## 2023-04-16 DIAGNOSIS — M6281 Muscle weakness (generalized): Secondary | ICD-10-CM | POA: Diagnosis not present

## 2023-04-16 DIAGNOSIS — R262 Difficulty in walking, not elsewhere classified: Secondary | ICD-10-CM | POA: Diagnosis not present

## 2023-04-16 DIAGNOSIS — S83272D Complex tear of lateral meniscus, current injury, left knee, subsequent encounter: Secondary | ICD-10-CM | POA: Diagnosis not present

## 2023-04-16 DIAGNOSIS — M25662 Stiffness of left knee, not elsewhere classified: Secondary | ICD-10-CM | POA: Diagnosis not present

## 2023-04-16 DIAGNOSIS — S83512D Sprain of anterior cruciate ligament of left knee, subsequent encounter: Secondary | ICD-10-CM | POA: Diagnosis not present

## 2023-04-21 DIAGNOSIS — S83512D Sprain of anterior cruciate ligament of left knee, subsequent encounter: Secondary | ICD-10-CM | POA: Diagnosis not present

## 2023-04-21 DIAGNOSIS — S83272D Complex tear of lateral meniscus, current injury, left knee, subsequent encounter: Secondary | ICD-10-CM | POA: Diagnosis not present

## 2023-04-21 DIAGNOSIS — R262 Difficulty in walking, not elsewhere classified: Secondary | ICD-10-CM | POA: Diagnosis not present

## 2023-04-21 DIAGNOSIS — M6281 Muscle weakness (generalized): Secondary | ICD-10-CM | POA: Diagnosis not present

## 2023-04-21 DIAGNOSIS — M25662 Stiffness of left knee, not elsewhere classified: Secondary | ICD-10-CM | POA: Diagnosis not present

## 2023-04-23 DIAGNOSIS — S83272D Complex tear of lateral meniscus, current injury, left knee, subsequent encounter: Secondary | ICD-10-CM | POA: Diagnosis not present

## 2023-04-23 DIAGNOSIS — R262 Difficulty in walking, not elsewhere classified: Secondary | ICD-10-CM | POA: Diagnosis not present

## 2023-04-23 DIAGNOSIS — M25662 Stiffness of left knee, not elsewhere classified: Secondary | ICD-10-CM | POA: Diagnosis not present

## 2023-04-23 DIAGNOSIS — M6281 Muscle weakness (generalized): Secondary | ICD-10-CM | POA: Diagnosis not present

## 2023-04-23 DIAGNOSIS — S83512D Sprain of anterior cruciate ligament of left knee, subsequent encounter: Secondary | ICD-10-CM | POA: Diagnosis not present

## 2023-04-28 DIAGNOSIS — R262 Difficulty in walking, not elsewhere classified: Secondary | ICD-10-CM | POA: Diagnosis not present

## 2023-04-28 DIAGNOSIS — S83512D Sprain of anterior cruciate ligament of left knee, subsequent encounter: Secondary | ICD-10-CM | POA: Diagnosis not present

## 2023-04-28 DIAGNOSIS — M6281 Muscle weakness (generalized): Secondary | ICD-10-CM | POA: Diagnosis not present

## 2023-04-28 DIAGNOSIS — S83272D Complex tear of lateral meniscus, current injury, left knee, subsequent encounter: Secondary | ICD-10-CM | POA: Diagnosis not present

## 2023-04-28 DIAGNOSIS — M25662 Stiffness of left knee, not elsewhere classified: Secondary | ICD-10-CM | POA: Diagnosis not present

## 2023-05-15 DIAGNOSIS — M25662 Stiffness of left knee, not elsewhere classified: Secondary | ICD-10-CM | POA: Diagnosis not present

## 2023-05-18 DIAGNOSIS — E559 Vitamin D deficiency, unspecified: Secondary | ICD-10-CM | POA: Diagnosis not present

## 2023-05-20 ENCOUNTER — Other Ambulatory Visit (HOSPITAL_COMMUNITY): Payer: Self-pay

## 2023-05-20 MED ORDER — VITAMIN D (ERGOCALCIFEROL) 50000 UNITS PO CAPS
1.0000 | ORAL_CAPSULE | ORAL | 0 refills | Status: AC
  Filled 2023-05-20: qty 12, 84d supply, fill #0

## 2023-05-25 ENCOUNTER — Other Ambulatory Visit (HOSPITAL_COMMUNITY): Payer: Self-pay

## 2023-05-25 MED ORDER — VALACYCLOVIR HCL 1 G PO TABS
1000.0000 mg | ORAL_TABLET | Freq: Every day | ORAL | 0 refills | Status: DC
  Filled 2023-05-25: qty 90, 90d supply, fill #0

## 2023-05-26 DIAGNOSIS — S83512D Sprain of anterior cruciate ligament of left knee, subsequent encounter: Secondary | ICD-10-CM | POA: Diagnosis not present

## 2023-05-26 DIAGNOSIS — S83272D Complex tear of lateral meniscus, current injury, left knee, subsequent encounter: Secondary | ICD-10-CM | POA: Diagnosis not present

## 2023-05-26 DIAGNOSIS — M6281 Muscle weakness (generalized): Secondary | ICD-10-CM | POA: Diagnosis not present

## 2023-05-26 DIAGNOSIS — M25662 Stiffness of left knee, not elsewhere classified: Secondary | ICD-10-CM | POA: Diagnosis not present

## 2023-05-26 DIAGNOSIS — R262 Difficulty in walking, not elsewhere classified: Secondary | ICD-10-CM | POA: Diagnosis not present

## 2023-06-26 DIAGNOSIS — S83272D Complex tear of lateral meniscus, current injury, left knee, subsequent encounter: Secondary | ICD-10-CM | POA: Diagnosis not present

## 2023-07-01 ENCOUNTER — Other Ambulatory Visit (HOSPITAL_COMMUNITY): Payer: Self-pay

## 2023-07-04 ENCOUNTER — Other Ambulatory Visit (HOSPITAL_COMMUNITY): Payer: Self-pay

## 2023-07-06 ENCOUNTER — Other Ambulatory Visit (HOSPITAL_COMMUNITY): Payer: Self-pay

## 2023-08-26 ENCOUNTER — Other Ambulatory Visit (HOSPITAL_COMMUNITY): Payer: Self-pay

## 2023-08-26 MED ORDER — VALACYCLOVIR HCL 1 G PO TABS
1000.0000 mg | ORAL_TABLET | Freq: Every day | ORAL | 1 refills | Status: DC
  Filled 2023-08-26: qty 90, 90d supply, fill #0
  Filled 2023-11-20: qty 90, 90d supply, fill #1

## 2023-08-28 DIAGNOSIS — S93491A Sprain of other ligament of right ankle, initial encounter: Secondary | ICD-10-CM | POA: Diagnosis not present

## 2023-08-28 DIAGNOSIS — M25562 Pain in left knee: Secondary | ICD-10-CM | POA: Diagnosis not present

## 2023-09-07 DIAGNOSIS — E559 Vitamin D deficiency, unspecified: Secondary | ICD-10-CM | POA: Diagnosis not present

## 2023-09-08 ENCOUNTER — Other Ambulatory Visit (HOSPITAL_COMMUNITY): Payer: Self-pay

## 2023-09-11 ENCOUNTER — Other Ambulatory Visit (HOSPITAL_COMMUNITY): Payer: Self-pay

## 2023-09-11 ENCOUNTER — Other Ambulatory Visit: Payer: Self-pay | Admitting: Obstetrics

## 2023-09-11 DIAGNOSIS — B3731 Acute candidiasis of vulva and vagina: Secondary | ICD-10-CM

## 2023-09-11 DIAGNOSIS — B9689 Other specified bacterial agents as the cause of diseases classified elsewhere: Secondary | ICD-10-CM

## 2023-09-11 DIAGNOSIS — M25562 Pain in left knee: Secondary | ICD-10-CM | POA: Diagnosis not present

## 2023-09-11 MED ORDER — TINIDAZOLE 500 MG PO TABS
1000.0000 mg | ORAL_TABLET | Freq: Every day | ORAL | 2 refills | Status: DC
  Filled 2023-09-11 – 2023-09-12 (×2): qty 10, 5d supply, fill #0
  Filled 2023-10-03: qty 10, 5d supply, fill #1

## 2023-09-11 MED ORDER — FLUCONAZOLE 200 MG PO TABS
200.0000 mg | ORAL_TABLET | ORAL | 2 refills | Status: DC
  Filled 2023-09-11 – 2023-09-12 (×2): qty 3, 9d supply, fill #0
  Filled 2023-10-03: qty 3, 9d supply, fill #1

## 2023-09-12 ENCOUNTER — Other Ambulatory Visit (HOSPITAL_COMMUNITY): Payer: Self-pay

## 2023-09-14 ENCOUNTER — Other Ambulatory Visit (HOSPITAL_COMMUNITY): Payer: Self-pay

## 2023-09-22 DIAGNOSIS — R262 Difficulty in walking, not elsewhere classified: Secondary | ICD-10-CM | POA: Diagnosis not present

## 2023-09-22 DIAGNOSIS — S83512D Sprain of anterior cruciate ligament of left knee, subsequent encounter: Secondary | ICD-10-CM | POA: Diagnosis not present

## 2023-09-22 DIAGNOSIS — S83272D Complex tear of lateral meniscus, current injury, left knee, subsequent encounter: Secondary | ICD-10-CM | POA: Diagnosis not present

## 2023-09-22 DIAGNOSIS — M6281 Muscle weakness (generalized): Secondary | ICD-10-CM | POA: Diagnosis not present

## 2023-10-08 DIAGNOSIS — S83512D Sprain of anterior cruciate ligament of left knee, subsequent encounter: Secondary | ICD-10-CM | POA: Diagnosis not present

## 2023-10-08 DIAGNOSIS — S83272D Complex tear of lateral meniscus, current injury, left knee, subsequent encounter: Secondary | ICD-10-CM | POA: Diagnosis not present

## 2023-10-08 DIAGNOSIS — M6281 Muscle weakness (generalized): Secondary | ICD-10-CM | POA: Diagnosis not present

## 2023-10-08 DIAGNOSIS — R262 Difficulty in walking, not elsewhere classified: Secondary | ICD-10-CM | POA: Diagnosis not present

## 2023-10-16 ENCOUNTER — Telehealth: Admitting: Physician Assistant

## 2023-10-16 DIAGNOSIS — R3989 Other symptoms and signs involving the genitourinary system: Secondary | ICD-10-CM

## 2023-10-16 MED ORDER — FLUCONAZOLE 150 MG PO TABS: ORAL_TABLET | ORAL | 0 refills | Status: DC

## 2023-10-16 MED ORDER — CEPHALEXIN 500 MG PO CAPS: 500.0000 mg | ORAL_CAPSULE | Freq: Two times a day (BID) | ORAL | 0 refills | Status: AC

## 2023-10-16 NOTE — Progress Notes (Signed)
 I have spent 5 minutes in review of e-visit questionnaire, review and updating patient chart, medical decision making and response to patient.   Piedad Climes, PA-C

## 2023-10-16 NOTE — Progress Notes (Signed)

## 2023-10-22 DIAGNOSIS — F4323 Adjustment disorder with mixed anxiety and depressed mood: Secondary | ICD-10-CM | POA: Diagnosis not present

## 2023-10-30 DIAGNOSIS — F4323 Adjustment disorder with mixed anxiety and depressed mood: Secondary | ICD-10-CM | POA: Diagnosis not present

## 2023-11-05 DIAGNOSIS — F4325 Adjustment disorder with mixed disturbance of emotions and conduct: Secondary | ICD-10-CM | POA: Diagnosis not present

## 2023-11-14 DIAGNOSIS — F4325 Adjustment disorder with mixed disturbance of emotions and conduct: Secondary | ICD-10-CM | POA: Diagnosis not present

## 2023-11-19 DIAGNOSIS — F4325 Adjustment disorder with mixed disturbance of emotions and conduct: Secondary | ICD-10-CM | POA: Diagnosis not present

## 2023-11-20 ENCOUNTER — Other Ambulatory Visit (HOSPITAL_COMMUNITY): Payer: Self-pay

## 2023-11-25 ENCOUNTER — Telehealth: Admitting: Nurse Practitioner

## 2023-11-25 DIAGNOSIS — R3989 Other symptoms and signs involving the genitourinary system: Secondary | ICD-10-CM | POA: Diagnosis not present

## 2023-11-25 DIAGNOSIS — T3695XA Adverse effect of unspecified systemic antibiotic, initial encounter: Secondary | ICD-10-CM

## 2023-11-25 DIAGNOSIS — B379 Candidiasis, unspecified: Secondary | ICD-10-CM

## 2023-11-25 MED ORDER — FLUCONAZOLE 150 MG PO TABS: 150.0000 mg | ORAL_TABLET | Freq: Once | ORAL | 0 refills | Status: AC

## 2023-11-25 MED ORDER — NITROFURANTOIN MONOHYD MACRO 100 MG PO CAPS: 100.0000 mg | ORAL_CAPSULE | Freq: Two times a day (BID) | ORAL | 0 refills | Status: AC

## 2023-11-25 NOTE — Progress Notes (Signed)
 E-Visit for Urinary Problems  We are sorry that you are not feeling well.  Here is how we plan to help!  Based on what you shared with me it looks like you most likely have a simple urinary tract infection.  A UTI (Urinary Tract Infection) is a bacterial infection of the bladder.  Most cases of urinary tract infections are simple to treat but a key part of your care is to encourage you to drink plenty of fluids and watch your symptoms carefully.  I have prescribed MacroBid  100 mg twice a day for 5 days.  Your symptoms should gradually improve. Call us  if the burning in your urine worsens, you develop worsening fever, back pain or pelvic pain or if your symptoms do not resolve after completing the antibiotic.  We will also send in one yeast pill for you to use as needed  Meds ordered this encounter  Medications   fluconazole  (DIFLUCAN ) 150 MG tablet    Sig: Take 1 tablet (150 mg total) by mouth once for 1 dose.    Dispense:  1 tablet    Refill:  0     Urinary tract infections can be prevented by drinking plenty of water to keep your body hydrated.  Also be sure when you wipe, wipe from front to back and don't hold it in!  If possible, empty your bladder every 4 hours.  HOME CARE Drink plenty of fluids Compete the full course of the antibiotics even if the symptoms resolve Remember, when you need to go.go. Holding in your urine can increase the likelihood of getting a UTI! GET HELP RIGHT AWAY IF: You cannot urinate You get a high fever Worsening back pain occurs You see blood in your urine You feel sick to your stomach or throw up You feel like you are going to pass out  MAKE SURE YOU  Understand these instructions. Will watch your condition. Will get help right away if you are not doing well or get worse.   Thank you for choosing an e-visit.  Your e-visit answers were reviewed by a board certified advanced clinical practitioner to complete your personal care plan. Depending  upon the condition, your plan could have included both over the counter or prescription medications.  Please review your pharmacy choice. Make sure the pharmacy is open so you can pick up prescription now. If there is a problem, you may contact your provider through [employer] and have the prescription routed to another pharmacy.  Your safety is important to us . If you have drug allergies check your prescription carefully.   For the next 24 hours you can use MyChart to ask questions about today's visit, request a non-urgent call back, or ask for a work or school excuse. You will get an email in the next two days asking about your experience. I hope that your e-visit has been valuable and will speed your recovery.  I spent approximately 5 minutes reviewing the patient's history, current symptoms and coordinating their care today.

## 2023-12-03 DIAGNOSIS — F4325 Adjustment disorder with mixed disturbance of emotions and conduct: Secondary | ICD-10-CM | POA: Diagnosis not present

## 2023-12-11 DIAGNOSIS — F4325 Adjustment disorder with mixed disturbance of emotions and conduct: Secondary | ICD-10-CM | POA: Diagnosis not present

## 2023-12-19 DIAGNOSIS — F4320 Adjustment disorder, unspecified: Secondary | ICD-10-CM | POA: Diagnosis not present

## 2023-12-20 ENCOUNTER — Telehealth: Admitting: Family

## 2023-12-20 DIAGNOSIS — N390 Urinary tract infection, site not specified: Secondary | ICD-10-CM

## 2023-12-20 NOTE — Progress Notes (Signed)
  Because you have recurrent UTI symptoms, I feel your condition warrants further evaluation and I recommend that you be seen in a face-to-face visit.   NOTE: There will be NO CHARGE for this E-Visit   If you are having a true medical emergency, please call 911.     For an urgent face to face visit, [facility] has multiple urgent care centers for your convenience.  Click the link below for the full list of locations and hours, walk-in wait times, appointment scheduling options and driving directions:  Urgent Care - [place], [place], [place], [place], [place], [place]  [place]     Your MyChart E-visit questionnaire answers were reviewed by a board certified advanced clinical practitioner to complete your personal care plan based on your specific symptoms.    Thank you for using e-Visits.

## 2023-12-21 ENCOUNTER — Ambulatory Visit (HOSPITAL_COMMUNITY)
Admission: EM | Admit: 2023-12-21 | Discharge: 2023-12-21 | Disposition: A | Discharge status: Home / Self Care | Attending: Nurse Practitioner | Admitting: Nurse Practitioner

## 2023-12-21 ENCOUNTER — Encounter (HOSPITAL_COMMUNITY): Payer: Self-pay

## 2023-12-21 DIAGNOSIS — Z3202 Encounter for pregnancy test, result negative: Secondary | ICD-10-CM

## 2023-12-21 DIAGNOSIS — N3000 Acute cystitis without hematuria: Secondary | ICD-10-CM | POA: Diagnosis not present

## 2023-12-21 LAB — POCT URINALYSIS DIP (MANUAL ENTRY)
Bilirubin, UA: NEGATIVE
Blood, UA: NEGATIVE
Glucose, UA: NEGATIVE mg/dL
Ketones, POC UA: NEGATIVE mg/dL
Nitrite, UA: NEGATIVE
Protein Ur, POC: NEGATIVE mg/dL
Spec Grav, UA: 1.015
Urobilinogen, UA: 0.2 U/dL
pH, UA: 6

## 2023-12-21 LAB — POCT URINE PREGNANCY: Preg Test, Ur: NEGATIVE

## 2023-12-21 MED ORDER — NITROFURANTOIN MONOHYD MACRO 100 MG PO CAPS
100.0000 mg | ORAL_CAPSULE | Freq: Two times a day (BID) | ORAL | 0 refills | Status: DC
Start: 2023-12-21 — End: 2024-02-17

## 2023-12-21 NOTE — Discharge Instructions (Addendum)
  1. Acute cystitis without hematuria (Primary) - POC urinalysis dipstick completed in UC shows trace leukocytes, no nitrite, no blood, possibly indicative of urinary tract infection - POCT urine pregnancy completed UC is negative - nitrofurantoin , macrocrystal-monohydrate, (MACROBID ) 100 MG capsule; Take 1 capsule (100 mg total) by mouth 2 (two) times daily.  Dispense: 10 capsule; Refill: 0 - Urine Culture collected in UC and sent to lab for further testing results should be available in 2 to 3 days. -Drink plenty of fluids and use restroom when you have the urge to not hold urine as this can increase risk for worsening infection. -Drink cranberry juice as this may improve symptoms and decrease risk for reinfection. -Continue to monitor symptoms if you develop any severe flank pain, intractable fever, vaginal discharge, severe abdominal pain follow-up in emergency department for further evaluation and management.

## 2023-12-21 NOTE — ED Provider Notes (Signed)
 [facility]  Note:  This document was prepared using Dragon voice recognition software and may include unintentional dictation errors.  MRN: [id number] DOB: 07-Jul-1986  Subjective:   Andrea Dean is a 38 y.o. female presenting for dysuria symptoms since Saturday.  Patient denies any vaginal itching, irritation, pelvic pain, flank pain, nausea/vomiting, diarrhea.  Patient reports past history of urinary tract infections.  Patient has not taken any over-the-counter medication to treat symptoms.  No current facility-administered medications for this encounter.  Current Outpatient Medications:    nitrofurantoin , macrocrystal-monohydrate, (MACROBID ) 100 MG capsule, Take 1 capsule (100 mg total) by mouth 2 (two) times daily., Disp: 10 capsule, Rfl: 0   cetirizine  (ZYRTEC ) 10 MG tablet, May take up to 2 tablets in morning and 2 tablets in evening for hives prevention and treatment. This is maximum dose. Titrate as needed., Disp: 120 tablet, Rfl: 11   valACYclovir  (VALTREX ) 1000 MG tablet, Take 1 tablet (1,000 mg total) by mouth daily., Disp: 90 tablet, Rfl: 1   Vitamin D , Ergocalciferol , 50000 units CAPS, Take 1 capsule by mouth once a week for 12 weeks, Disp: 12 capsule, Rfl: 0   Allergies  Allergen Reactions   Flagyl  [Metronidazole  Hcl] Hives and Nausea And Vomiting   Latex     sensitive    Past Medical History:  Diagnosis Date   Abnormal Pap smear    repeat WNL   ACL tear    Acne    Chlamydia    Fracture closed, fibula, shaft 07/21/2005   left, no surgical repair   GERD (gastroesophageal reflux disease)    H/O seasonal allergies    Herpes genitalis in women    MRSA (methicillin resistant staph aureus) culture positive    Ovarian cyst    PONV (postoperative nausea and vomiting)    Preterm labor    Thrombocytopenia (HCC)    2006 DURING PREGNANCY - NO PROBLEMS WITH DELIVERY OR ANY KNOWN PROBLEMS SINCE   Urinary tract infection    Urticaria      Past Surgical  History:  Procedure Laterality Date   CESAREAN SECTION     I & D OF PILONIADAL CYST IN OFFICE - LOCAL - FELT JITTERY AFTERWARDS     KNEE ARTHROSCOPY WITH LATERAL MENISECTOMY Left 01/20/2023   Procedure: KNEE ARTHROSCOPY WITH LATERAL MENISECTOMY;  Surgeon: Osa Blase, MD;  Location: [facility];  Service: Orthopedics;  Laterality: Left;   KNEE CLOSED REDUCTION Left 03/10/2023   Procedure: CLOSED MANIPULATION LEFT KNEE;  Surgeon: Osa Blase, MD;  Location: [facility];  Service: Orthopedics;  Laterality: Left;   PILONIDAL CYST EXCISION N/A 05/05/2014   Procedure:  EXCISION PILONIDAL CYST;  Surgeon: Joyce Nixon, MD;  Location: [facility];  Service: General;  Laterality: N/A;   WISDOM TOOTH EXTRACTION  07/21/2002    Family History  Problem Relation Age of Onset   Cancer Paternal Grandmother        brain, lung   Diabetes Paternal Grandmother    Heart disease Paternal Grandmother    Hypertension Paternal Grandmother    Hypertension Maternal Grandmother    Stroke Maternal Grandmother    Anesthesia problems Neg Hx    Hypotension Neg Hx    Malignant hyperthermia Neg Hx    Pseudochol deficiency Neg Hx    Allergic rhinitis Neg Hx    Asthma Neg Hx    Angioedema Neg Hx    Eczema Neg Hx    Immunodeficiency Neg Hx    Urticaria Neg Hx  Social History   Tobacco Use   Smoking status: Never    Passive exposure: Never   Smokeless tobacco: Never  Vaping Use   Vaping status: Never Used  Substance Use Topics   Alcohol use: No   Drug use: No    ROS Refer to HPI for ROS details.  Objective:   Vitals: BP 120/80 (BP Location: Right Arm)   Pulse 79   Temp 97.6 F (36.4 C) (Oral)   Resp 16   LMP 12/08/2023 (Exact Date)   SpO2 96%   Physical Exam Vitals and nursing note reviewed.  Constitutional:      General: She is not in acute distress.    Appearance: Normal appearance. She is well-developed. She is not ill-appearing or toxic-appearing.  HENT:      Head: Normocephalic and atraumatic.  Cardiovascular:     Rate and Rhythm: Normal rate.  Pulmonary:     Effort: Pulmonary effort is normal. No respiratory distress.  Abdominal:     General: There is no distension.     Palpations: Abdomen is soft.     Tenderness: There is no abdominal tenderness. There is no right CVA tenderness, left CVA tenderness, guarding or rebound.  Skin:    General: Skin is warm and dry.  Neurological:     General: No focal deficit present.     Mental Status: She is alert and oriented to person, place, and time.  Psychiatric:        Mood and Affect: Mood normal.        Behavior: Behavior normal.     Procedures  Results for orders placed or performed during the hospital encounter of 12/21/23 (from the past 24 hours)  POC urinalysis dipstick     Status: Abnormal   Collection Time: 12/21/23  8:36 AM  Result Value Ref Range   Color, UA yellow    Clarity, UA clear    Glucose, UA negative mg/dL   Bilirubin, UA negative    Ketones, POC UA negative mg/dL   Spec Grav, UA 0.981    Blood, UA negative    pH, UA 6.0    Protein Ur, POC negative mg/dL   Urobilinogen, UA 0.2 E.U./dL   Nitrite, UA Negative    Leukocytes, UA Trace (A)   POCT urine pregnancy     Status: Normal   Collection Time: 12/21/23  8:37 AM  Result Value Ref Range   Preg Test, Ur Negative     No results found.   Assessment and Plan :     Discharge Instructions       1. Acute cystitis without hematuria (Primary) - POC urinalysis dipstick completed in UC shows trace leukocytes, no nitrite, no blood, possibly indicative of urinary tract infection - POCT urine pregnancy completed UC is negative - nitrofurantoin , macrocrystal-monohydrate, (MACROBID ) 100 MG capsule; Take 1 capsule (100 mg total) by mouth 2 (two) times daily.  Dispense: 10 capsule; Refill: 0 - Urine Culture collected in UC and sent to lab for further testing results should be available in 2 to 3 days. -Drink plenty of  fluids and use restroom when you have the urge to not hold urine as this can increase risk for worsening infection. -Drink cranberry juice as this may improve symptoms and decrease risk for reinfection. -Continue to monitor symptoms if you develop any severe flank pain, intractable fever, vaginal discharge, severe abdominal pain follow-up in emergency department for further evaluation and management.    Jalen Oberry B Malikai Gut   Shakhia Gramajo,  Jame Morrell B, NP 12/21/23 [phone]

## 2023-12-21 NOTE — ED Triage Notes (Signed)
 Patient here today with c/o burning in urination and urinary frequency since Saturday evening.

## 2023-12-23 ENCOUNTER — Ambulatory Visit: Payer: Self-pay

## 2023-12-23 LAB — URINE CULTURE
Culture: >=100000 — AB
Special Requests: NORMAL

## 2023-12-25 DIAGNOSIS — F4320 Adjustment disorder, unspecified: Secondary | ICD-10-CM | POA: Diagnosis not present

## 2024-01-02 ENCOUNTER — Other Ambulatory Visit (HOSPITAL_COMMUNITY): Payer: Self-pay

## 2024-01-02 DIAGNOSIS — X58XXXA Exposure to other specified factors, initial encounter: Secondary | ICD-10-CM | POA: Diagnosis not present

## 2024-01-02 DIAGNOSIS — N3001 Acute cystitis with hematuria: Secondary | ICD-10-CM | POA: Diagnosis not present

## 2024-01-02 DIAGNOSIS — T887XXA Unspecified adverse effect of drug or medicament, initial encounter: Secondary | ICD-10-CM | POA: Diagnosis not present

## 2024-01-02 MED ORDER — CIPROFLOXACIN HCL 500 MG PO TABS
500.0000 mg | ORAL_TABLET | Freq: Two times a day (BID) | ORAL | 0 refills | Status: DC
  Filled 2024-01-02: qty 14, 7d supply, fill #0

## 2024-01-02 MED ORDER — FLUCONAZOLE 150 MG PO TABS
150.0000 mg | ORAL_TABLET | Freq: Once | ORAL | 2 refills | Status: DC
  Filled 2024-01-02: qty 1, 1d supply, fill #0

## 2024-01-13 DIAGNOSIS — F4320 Adjustment disorder, unspecified: Secondary | ICD-10-CM | POA: Diagnosis not present

## 2024-01-21 DIAGNOSIS — F4320 Adjustment disorder, unspecified: Secondary | ICD-10-CM | POA: Diagnosis not present

## 2024-01-25 ENCOUNTER — Other Ambulatory Visit (HOSPITAL_COMMUNITY): Payer: Self-pay

## 2024-01-25 DIAGNOSIS — Z8639 Personal history of other endocrine, nutritional and metabolic disease: Secondary | ICD-10-CM | POA: Diagnosis not present

## 2024-01-25 DIAGNOSIS — Z01419 Encounter for gynecological examination (general) (routine) without abnormal findings: Secondary | ICD-10-CM | POA: Diagnosis not present

## 2024-01-25 DIAGNOSIS — Z13228 Encounter for screening for other metabolic disorders: Secondary | ICD-10-CM | POA: Diagnosis not present

## 2024-01-25 DIAGNOSIS — Z113 Encounter for screening for infections with a predominantly sexual mode of transmission: Secondary | ICD-10-CM | POA: Diagnosis not present

## 2024-01-25 DIAGNOSIS — Z124 Encounter for screening for malignant neoplasm of cervix: Secondary | ICD-10-CM | POA: Diagnosis not present

## 2024-01-25 DIAGNOSIS — E7800 Pure hypercholesterolemia, unspecified: Secondary | ICD-10-CM | POA: Diagnosis not present

## 2024-01-25 DIAGNOSIS — Z130 Encounter for screening for diseases of the blood and blood-forming organs and certain disorders involving the immune mechanism: Secondary | ICD-10-CM | POA: Diagnosis not present

## 2024-01-25 DIAGNOSIS — A609 Anogenital herpesviral infection, unspecified: Secondary | ICD-10-CM | POA: Diagnosis not present

## 2024-01-25 MED ORDER — VALACYCLOVIR HCL 1 G PO TABS
1.0000 g | ORAL_TABLET | Freq: Every day | ORAL | 3 refills | Status: AC
  Filled 2024-01-25 – 2024-01-26 (×2): qty 90, 90d supply, fill #0
  Filled 2024-06-18: qty 90, 90d supply, fill #1

## 2024-01-26 ENCOUNTER — Other Ambulatory Visit (HOSPITAL_COMMUNITY): Payer: Self-pay

## 2024-01-30 DIAGNOSIS — F4320 Adjustment disorder, unspecified: Secondary | ICD-10-CM | POA: Diagnosis not present

## 2024-02-12 DIAGNOSIS — F4320 Adjustment disorder, unspecified: Secondary | ICD-10-CM | POA: Diagnosis not present

## 2024-02-17 ENCOUNTER — Telehealth: Admitting: Physician Assistant

## 2024-02-17 DIAGNOSIS — R3989 Other symptoms and signs involving the genitourinary system: Secondary | ICD-10-CM

## 2024-02-17 MED ORDER — CEPHALEXIN 500 MG PO CAPS: 500.0000 mg | ORAL_CAPSULE | Freq: Two times a day (BID) | ORAL | 0 refills | Status: AC

## 2024-02-17 NOTE — Progress Notes (Signed)
 I have spent 5 minutes in review of e-visit questionnaire, review and updating patient chart, medical decision making and response to patient.   Piedad Climes, PA-C

## 2024-02-17 NOTE — Progress Notes (Signed)

## 2024-03-04 DIAGNOSIS — F4320 Adjustment disorder, unspecified: Secondary | ICD-10-CM | POA: Diagnosis not present

## 2024-03-11 DIAGNOSIS — F4320 Adjustment disorder, unspecified: Secondary | ICD-10-CM | POA: Diagnosis not present

## 2024-03-18 ENCOUNTER — Telehealth: Admitting: Physician Assistant

## 2024-03-18 DIAGNOSIS — N390 Urinary tract infection, site not specified: Secondary | ICD-10-CM

## 2024-03-18 MED ORDER — SULFAMETHOXAZOLE-TRIMETHOPRIM 800-160 MG PO TABS: 1.0000 | ORAL_TABLET | Freq: Two times a day (BID) | ORAL | 0 refills | Status: DC

## 2024-03-18 NOTE — Progress Notes (Signed)
 E-Visit for Urinary Problems  We are sorry that you are not feeling well.  Here is how we plan to help!  Based on what you shared with me it looks like you most likely have a simple urinary tract infection.  A UTI (Urinary Tract Infection) is a bacterial infection of the bladder.  Most cases of urinary tract infections are simple to treat but a key part of your care is to encourage you to drink plenty of fluids and watch your symptoms carefully.  I have prescribed Bactrim DS One tablet twice a day for 5 days.  Your symptoms should gradually improve. Call us if the burning in your urine worsens, you develop worsening fever, back pain or pelvic pain or if your symptoms do not resolve after completing the antibiotic.  Urinary tract infections can be prevented by drinking plenty of water to keep your body hydrated.  Also be sure when you wipe, wipe from front to back and don't hold it in!  If possible, empty your bladder every 4 hours.  HOME CARE Drink plenty of fluids Compete the full course of the antibiotics even if the symptoms resolve Remember, when you need to go.go. Holding in your urine can increase the likelihood of getting a UTI! GET HELP RIGHT AWAY IF: You cannot urinate You get a high fever Worsening back pain occurs You see blood in your urine You feel sick to your stomach or throw up You feel like you are going to pass out  MAKE SURE YOU  Understand these instructions. Will watch your condition. Will get help right away if you are not doing well or get worse.   Thank you for choosing an e-visit.  Your e-visit answers were reviewed by a board certified advanced clinical practitioner to complete your personal care plan. Depending upon the condition, your plan could have included both over the counter or prescription medications.  Please review your pharmacy choice. Make sure the pharmacy is open so you can pick up prescription now. If there is a problem, you may contact  your provider through [employer] and have the prescription routed to another pharmacy.  Your safety is important to [place]. If you have drug allergies check your prescription carefully.   For the next 24 hours you can use MyChart to ask questions about today's visit, request a non-urgent call back, or ask for a work or school excuse. You will get an email in the next two days asking about your experience. I hope that your e-visit has been valuable and will speed your recovery.    I have spent 5 minutes in review of e-visit questionnaire, review and updating patient chart, medical decision making and response to patient.   Margaretann Loveless, PA-C

## 2024-03-24 ENCOUNTER — Other Ambulatory Visit: Payer: Self-pay | Admitting: Family Medicine

## 2024-03-25 DIAGNOSIS — F4320 Adjustment disorder, unspecified: Secondary | ICD-10-CM | POA: Diagnosis not present

## 2024-03-26 ENCOUNTER — Other Ambulatory Visit: Payer: Self-pay | Admitting: Family Medicine

## 2024-03-28 ENCOUNTER — Encounter (HOSPITAL_COMMUNITY): Payer: Self-pay

## 2024-03-28 ENCOUNTER — Other Ambulatory Visit (HOSPITAL_COMMUNITY): Payer: Self-pay

## 2024-04-01 DIAGNOSIS — F4320 Adjustment disorder, unspecified: Secondary | ICD-10-CM | POA: Diagnosis not present

## 2024-04-02 ENCOUNTER — Other Ambulatory Visit (HOSPITAL_COMMUNITY): Payer: Self-pay

## 2024-04-11 ENCOUNTER — Other Ambulatory Visit (HOSPITAL_COMMUNITY): Payer: Self-pay

## 2024-04-11 ENCOUNTER — Ambulatory Visit (INDEPENDENT_AMBULATORY_CARE_PROVIDER_SITE_OTHER): Admitting: Family

## 2024-04-11 ENCOUNTER — Other Ambulatory Visit: Payer: Self-pay

## 2024-04-11 ENCOUNTER — Encounter: Payer: Self-pay | Admitting: Family

## 2024-04-11 VITALS — BP 108/72 | HR 94 | Temp 98.1°F | Wt 172.3 lb

## 2024-04-11 DIAGNOSIS — L508 Other urticaria: Secondary | ICD-10-CM | POA: Diagnosis not present

## 2024-04-11 MED ORDER — EPINEPHRINE 0.3 MG/0.3ML IJ SOAJ
0.3000 mg | INTRAMUSCULAR | 1 refills | Status: AC | PRN
  Filled 2024-04-11: qty 2, 14d supply, fill #0

## 2024-04-11 MED ORDER — CETIRIZINE HCL 10 MG PO TABS
20.0000 mg | ORAL_TABLET | Freq: Two times a day (BID) | ORAL | 11 refills | Status: AC
  Filled 2024-04-11: qty 120, 30d supply, fill #0
  Filled 2024-06-18: qty 120, 30d supply, fill #1

## 2024-04-11 NOTE — Progress Notes (Signed)
 [address] [place] [address] Dept: [phone]  FOLLOW UP NOTE  Patient ID: Andrea Dean, female    DOB: 01-05-1986  Age: 38 y.o. MRN: [id number] Date of Office Visit: 04/11/2024  Assessment  Chief Complaint: Follow-up and Urticaria (No concerns)  HPI Andrea Dean is a 38 year old female who presents today for follow-up of chronic urticaria.  She was last seen on Dec 12, 2022 by Arlean Mutter, FNP.  She reports since her last office visit in June 2024 her left ACL ruptured after falling and trying to avoid landing on her dog.  In July 2024 she had to have surgery on her left ACL, MCL, and meniscus.  Otherwise she denies any other new diagnosis or surgeries.  Chronic urticaria: She continues to take Zyrtec  2 tablets at night.  She does not have any hives when she stays on this regimen.  She has tried decreasing to Zyrtec  1 tablet once a day, but she becomes itchy.  She has only had 1 outbreak of hives around April and May of this year since her last office visit.  During that time she increased her Zyrtec  10 mg to 4 tablets at night.  If she takes 2 tablets in the morning and 2 tablets at night of the Zyrtec  it makes her too sleepy.  She is not tried Allegra  and reports that her flares do not happen often enough.  She is not interested in Xolair because Zyrtec  controls her symptoms.  She reports that she was prescribed an EpiPen  in the past, but did not pick it up .One of her reactions in the past when she had hives she also had GI symptoms so she was prescribed an EpiPen .   Drug Allergies:  Allergies  Allergen Reactions   Flagyl  [Metronidazole  Hcl] Hives and Nausea And Vomiting   Latex     sensitive    Review of Systems: Negative except as per HPI   Physical Exam: BP 108/72   Pulse 94   Temp 98.1 F (36.7 C)   Wt 172 lb 4.8 oz (78.2 kg)   SpO2 97%   BMI 30.52 kg/m    Physical Exam Constitutional:      Appearance: Normal appearance.  HENT:     Head: Normocephalic  and atraumatic.     Comments: Pharynx normal, eyes normal, ears normal, nose normal    Right Ear: Tympanic membrane, ear canal and external ear normal.     Left Ear: Tympanic membrane, ear canal and external ear normal.     Nose: Nose normal.     Mouth/Throat:     Mouth: Mucous membranes are moist.     Pharynx: Oropharynx is clear.  Eyes:     Conjunctiva/sclera: Conjunctivae normal.  Cardiovascular:     Rate and Rhythm: Regular rhythm.     Heart sounds: Normal heart sounds.  Pulmonary:     Effort: Pulmonary effort is normal.     Breath sounds: Normal breath sounds.     Comments: Lungs clear to auscultation Musculoskeletal:     Cervical back: Neck supple.  Skin:    General: Skin is warm.     Comments: No rashes or urticarial lesions noted  Neurological:     Mental Status: She is alert and oriented to person, place, and time.  Psychiatric:        Mood and Affect: Mood normal.        Behavior: Behavior normal.        Thought Content:  Thought content normal.        Judgment: Judgment normal.     Diagnostics:  None  Assessment and Plan: 1. Chronic urticaria     Meds ordered this encounter  Medications   cetirizine  (ZYRTEC ) 10 MG tablet    Sig: May take up to 2 tablets by mouth in morning and 2 tablets in evening for hives prevention and treatment. This is maximum dose. Titrate as needed.    Dispense:  120 tablet    Refill:  11   EPINEPHrine  0.3 mg/0.3 mL IJ SOAJ injection    Sig: Inject 0.3 mg into the muscle as needed.    Dispense:  2 each    Refill:  1    Patient Instructions  Hives (urticaria) -Consider switching to Allegra  due to Zyrtec  causing drowsiness when taken more than current dose of 2 tablets at night. -Take the least amount of medications possible while remaining hive free Cetirizine  (Zyrtec ) 10mg  twice a day and famotidine  (Pepcid ) 20 mg twice a day. If no symptoms for 7-14 days then decrease to. Cetirizine  (Zyrtec ) 10mg  twice a day and famotidine   (Pepcid ) 20 mg once a day.  If no symptoms for 7-14 days then decrease to. Cetirizine  (Zyrtec ) 10mg  twice a day.  If no symptoms for 7-14 days then decrease to. Cetirizine  (Zyrtec ) 10mg  once a day. If symptoms return, then step up dosage May use Benadryl  (diphenhydramine ) as needed for breakthrough hives Keep a detailed symptom journal including foods eaten, contact with allergens, medications taken, weather changes.  Consider Xolair if your symptoms are not well-controlled with the treatment plan as listed above In case of an allergic reaction, take Benadryl  50 mg every 4 hours, and if life-threatening symptoms occur, inject with EpiPen  0.3 mg.Refill of EpiPen  sent   Call the clinic if this treatment plan is not working well for you.  Follow up in 1 year or sooner if needed.  Return in about 1 year (around 04/11/2025), or if symptoms worsen or fail to improve.    Thank you for the opportunity to care for this patient.  Please do not hesitate to contact me with questions.  Wanda Craze, FNP [facility] 

## 2024-04-11 NOTE — Patient Instructions (Addendum)
 Hives (urticaria) -Consider switching to Allegra  due to Zyrtec  causing drowsiness when taken more than current dose of 2 tablets at night. -Take the least amount of medications possible while remaining hive free Cetirizine  (Zyrtec ) 10mg  twice a day and famotidine  (Pepcid ) 20 mg twice a day. If no symptoms for 7-14 days then decrease to. Cetirizine  (Zyrtec ) 10mg  twice a day and famotidine  (Pepcid ) 20 mg once a day.  If no symptoms for 7-14 days then decrease to. Cetirizine  (Zyrtec ) 10mg  twice a day.  If no symptoms for 7-14 days then decrease to. Cetirizine  (Zyrtec ) 10mg  once a day. If symptoms return, then step up dosage May use Benadryl  (diphenhydramine ) as needed for breakthrough hives Keep a detailed symptom journal including foods eaten, contact with allergens, medications taken, weather changes.  Consider Xolair if your symptoms are not well-controlled with the treatment plan as listed above In case of an allergic reaction, take Benadryl  50 mg every 4 hours, and if life-threatening symptoms occur, inject with EpiPen  0.3 mg.Refill of EpiPen  sent   Call the clinic if this treatment plan is not working well for you.  Follow up in 1 year or sooner if needed.

## 2024-04-13 ENCOUNTER — Telehealth: Admitting: Family Medicine

## 2024-04-13 DIAGNOSIS — B3731 Acute candidiasis of vulva and vagina: Secondary | ICD-10-CM

## 2024-04-13 MED ORDER — FLUCONAZOLE 150 MG PO TABS: 150.0000 mg | ORAL_TABLET | ORAL | 0 refills | Status: DC

## 2024-04-13 NOTE — Progress Notes (Signed)
 E-Visit for Vaginal Symptoms  We are sorry that you are not feeling well. Here is how we plan to help! Based on what you shared with me it looks like you: May have a yeast vaginosis  Vaginosis is an inflammation of the vagina that can result in discharge, itching and pain. The cause is usually a change in the normal balance of vaginal bacteria or an infection. Vaginosis can also result from reduced estrogen levels after menopause.  The most common causes of vaginosis are:   Bacterial vaginosis which results from an overgrowth of one on several organisms that are normally present in your vagina.   Yeast infections which are caused by a naturally occurring fungus called candida.   Vaginal atrophy (atrophic vaginosis) which results from the thinning of the vagina from reduced estrogen levels after menopause.   Trichomoniasis which is caused by a parasite and is commonly transmitted by sexual intercourse.  Factors that increase your risk of developing vaginosis include: Medications, such as antibiotics and steroids Uncontrolled diabetes Use of hygiene products such as bubble bath, vaginal spray or vaginal deodorant Douching Wearing damp or tight-fitting clothing Using an intrauterine device (IUD) for birth control Hormonal changes, such as those associated with pregnancy, birth control pills or menopause Sexual activity Having a sexually transmitted infection  Your treatment plan is Diflucan  150 mg tablet repeat in 3 days if needed.   Be sure to take all of the medication as directed. Stop taking any medication if you develop a rash, tongue swelling or shortness of breath. Mothers who are breast feeding should consider pumping and discarding their breast milk while on these antibiotics. However, there is no consensus that infant exposure at these doses would be harmful.  Remember that medication creams can weaken latex condoms. SABRA   HOME CARE:  Good hygiene may prevent some types of  vaginosis from recurring and may relieve some symptoms:  Avoid baths, hot tubs and whirlpool spas. Rinse soap from your outer genital area after a shower, and dry the area well to prevent irritation. Don't use scented or harsh soaps, such as those with deodorant or antibacterial action. Avoid irritants. These include scented tampons and pads. Wipe from front to back after using the toilet. Doing so avoids spreading fecal bacteria to your vagina.  Other things that may help prevent vaginosis include:  Don't douche. Your vagina doesn't require cleansing other than normal bathing. Repetitive douching disrupts the normal organisms that reside in the vagina and can actually increase your risk of vaginal infection. Douching won't clear up a vaginal infection. Use a latex condom. Both female and female latex condoms may help you avoid infections spread by sexual contact. Wear cotton underwear. Also wear pantyhose with a cotton crotch. If you feel comfortable without it, skip wearing underwear to bed. Yeast thrives in [employer] Your symptoms should improve in the next day or two.  GET HELP RIGHT AWAY IF:  You have pain in your lower abdomen ( pelvic area or over your ovaries) You develop nausea or vomiting You develop a fever Your discharge changes or worsens You have persistent pain with intercourse You develop shortness of breath, a rapid pulse, or you faint.  These symptoms could be signs of problems or infections that need to be evaluated by a medical provider now.  MAKE SURE YOU   Understand these instructions. Will watch your condition. Will get help right away if you are not doing well or get worse.  Thank you for choosing  an e-visit.  Your e-visit answers were reviewed by a board certified advanced clinical practitioner to complete your personal care plan. Depending upon the condition, your plan could have included both over the counter or prescription medications.  Please  review your pharmacy choice. Make sure the pharmacy is open so you can pick up prescription now. If there is a problem, you may contact your provider through [employer] and have the prescription routed to another pharmacy.  Your safety is important to us . If you have drug allergies check your prescription carefully.   For the next 24 hours you can use MyChart to ask questions about today's visit, request a non-urgent call back, or ask for a work or school excuse. You will get an email in the next two days asking about your experience. I hope that your e-visit has been valuable and will speed your recovery.  I provided 5 minutes of non face-to-face time during this encounter for chart review, medication and order placement, as well as and documentation.

## 2024-05-07 DIAGNOSIS — F4320 Adjustment disorder, unspecified: Secondary | ICD-10-CM | POA: Diagnosis not present

## 2024-05-14 DIAGNOSIS — F4320 Adjustment disorder, unspecified: Secondary | ICD-10-CM | POA: Diagnosis not present

## 2024-05-24 ENCOUNTER — Telehealth: Admitting: Physician Assistant

## 2024-05-24 DIAGNOSIS — R3989 Other symptoms and signs involving the genitourinary system: Secondary | ICD-10-CM

## 2024-05-24 MED ORDER — CEPHALEXIN 500 MG PO CAPS: 500.0000 mg | ORAL_CAPSULE | Freq: Two times a day (BID) | ORAL | 0 refills | Status: AC

## 2024-05-24 MED ORDER — FLUCONAZOLE 150 MG PO TABS: ORAL_TABLET | ORAL | 0 refills | Status: AC

## 2024-05-24 NOTE — Progress Notes (Signed)

## 2024-05-28 DIAGNOSIS — F4320 Adjustment disorder, unspecified: Secondary | ICD-10-CM | POA: Diagnosis not present

## 2024-06-05 DIAGNOSIS — F4320 Adjustment disorder, unspecified: Secondary | ICD-10-CM | POA: Diagnosis not present

## 2024-06-18 ENCOUNTER — Other Ambulatory Visit (HOSPITAL_COMMUNITY): Payer: Self-pay

## 2024-07-02 DIAGNOSIS — F4320 Adjustment disorder, unspecified: Secondary | ICD-10-CM | POA: Diagnosis not present

## 2024-07-16 DIAGNOSIS — Z3A01 Less than 8 weeks gestation of pregnancy: Secondary | ICD-10-CM | POA: Diagnosis not present

## 2024-07-16 DIAGNOSIS — O26891 Other specified pregnancy related conditions, first trimester: Secondary | ICD-10-CM | POA: Diagnosis not present

## 2024-07-16 DIAGNOSIS — R1032 Left lower quadrant pain: Secondary | ICD-10-CM | POA: Diagnosis not present

## 2024-07-16 DIAGNOSIS — Z5321 Procedure and treatment not carried out due to patient leaving prior to being seen by health care provider: Secondary | ICD-10-CM | POA: Diagnosis not present

## 2024-07-19 DIAGNOSIS — Z0000 Encounter for general adult medical examination without abnormal findings: Secondary | ICD-10-CM | POA: Diagnosis not present

## 2024-07-19 DIAGNOSIS — E559 Vitamin D deficiency, unspecified: Secondary | ICD-10-CM | POA: Diagnosis not present

## 2024-07-19 DIAGNOSIS — E7800 Pure hypercholesterolemia, unspecified: Secondary | ICD-10-CM | POA: Diagnosis not present

## 2025-04-11 ENCOUNTER — Ambulatory Visit: Admitting: Family
